# Patient Record
Sex: Female | Born: 1935 | Race: White | Hispanic: No | Smoking: Former smoker
Health system: Southern US, Community
[De-identification: ages and names within clinical notes are randomized; demographics above are authoritative.]

## PROBLEM LIST (undated history)

## (undated) DIAGNOSIS — C3490 Malignant neoplasm of unspecified part of unspecified bronchus or lung: Secondary | ICD-10-CM

## (undated) DIAGNOSIS — K5790 Diverticulosis of intestine, part unspecified, without perforation or abscess without bleeding: Secondary | ICD-10-CM

## (undated) DIAGNOSIS — T7840XA Allergy, unspecified, initial encounter: Secondary | ICD-10-CM

## (undated) DIAGNOSIS — I739 Peripheral vascular disease, unspecified: Secondary | ICD-10-CM

## (undated) DIAGNOSIS — C801 Malignant (primary) neoplasm, unspecified: Secondary | ICD-10-CM

## (undated) DIAGNOSIS — E7800 Pure hypercholesterolemia, unspecified: Secondary | ICD-10-CM

## (undated) DIAGNOSIS — Z923 Personal history of irradiation: Secondary | ICD-10-CM

## (undated) HISTORY — DX: Allergy, unspecified, initial encounter: T78.40XA

## (undated) HISTORY — DX: Personal history of irradiation: Z92.3

## (undated) HISTORY — DX: Pure hypercholesterolemia, unspecified: E78.00

## (undated) HISTORY — DX: Peripheral vascular disease, unspecified: I73.9

## (undated) HISTORY — PX: VOICE PROSTHESIS: SHX5222

## (undated) HISTORY — PX: TRACHEOSTOMY: SUR1362

---

## 1995-12-31 HISTORY — PX: LARYNGECTOMY: SUR815

## 2000-04-23 ENCOUNTER — Encounter: Payer: Self-pay | Admitting: Otolaryngology

## 2000-04-23 ENCOUNTER — Encounter: Admission: RE | Admit: 2000-04-23 | Discharge: 2000-04-23 | Payer: Self-pay | Admitting: Otolaryngology

## 2001-07-10 ENCOUNTER — Ambulatory Visit (HOSPITAL_BASED_OUTPATIENT_CLINIC_OR_DEPARTMENT_OTHER): Admission: RE | Admit: 2001-07-10 | Discharge: 2001-07-10 | Payer: Self-pay | Admitting: Otolaryngology

## 2002-07-01 ENCOUNTER — Encounter: Payer: Self-pay | Admitting: Otolaryngology

## 2002-07-01 ENCOUNTER — Encounter: Admission: RE | Admit: 2002-07-01 | Discharge: 2002-07-01 | Payer: Self-pay | Admitting: Otolaryngology

## 2003-05-22 ENCOUNTER — Emergency Department (HOSPITAL_COMMUNITY): Admission: EM | Admit: 2003-05-22 | Discharge: 2003-05-22 | Payer: Self-pay | Admitting: *Deleted

## 2003-08-14 ENCOUNTER — Emergency Department (HOSPITAL_COMMUNITY): Admission: EM | Admit: 2003-08-14 | Discharge: 2003-08-14 | Payer: Self-pay | Admitting: Emergency Medicine

## 2008-07-31 ENCOUNTER — Emergency Department (HOSPITAL_COMMUNITY): Admission: EM | Admit: 2008-07-31 | Discharge: 2008-07-31 | Payer: Self-pay | Admitting: Emergency Medicine

## 2009-04-29 ENCOUNTER — Emergency Department (HOSPITAL_COMMUNITY): Admission: EM | Admit: 2009-04-29 | Discharge: 2009-04-29 | Payer: Self-pay | Admitting: Emergency Medicine

## 2011-01-08 ENCOUNTER — Encounter
Admission: RE | Admit: 2011-01-08 | Discharge: 2011-01-08 | Payer: Self-pay | Source: Home / Self Care | Attending: Otolaryngology | Admitting: Otolaryngology

## 2011-01-22 ENCOUNTER — Ambulatory Visit
Admission: RE | Admit: 2011-01-22 | Discharge: 2011-01-22 | Payer: Self-pay | Source: Home / Self Care | Attending: Thoracic Surgery | Admitting: Thoracic Surgery

## 2011-01-23 NOTE — Letter (Signed)
January 22, 2011  Jefry H. Pollyann Kennedy, MD [address] [place] [address]  Re:  NEASIA, Kim Hardy                 DOB:  26-Oct-1931  Dear Trey Paula,  I saw the patient in the office today.  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  On followup, she was found to have a left upper lobe lesion.  Left lower lobe lesion that measured 2.6 x 1.7 in the superior segment.  She also has a 15-mm lesion in the left upper lobe that is ground-glass in appearance and a 11-mm subcarinal node.  She has had no hemoptysis, fever, chills or excessive sputum.  MEDICATIONS:  Aspirin  ALLERGIES:  She is allergic to anesthesia caused nausea and vomiting, Boniva caused tremors, sulfa caused hives and statins caused nausea and vomiting.  She has hypercholesterolemia.  FAMILY HISTORY:  Noncontributory.  SOCIAL HISTORY:  She is single, has one child, retired, quit smoking in 1997, occasional alcohol intake.  REVIEW OF SYSTEMS:  VITAL SIGNS:  She is 5 feet 9 inches. GENERAL:  Her weight has been stable. CARDIAC:  No angina or atrial fibrillation. PULMONARY:  No hemoptysis.  See history of present illness. GI:  No nausea, vomiting, constipation or diarrhea. GU:  No kidney disease, dysuria or frequent urination. VASCULAR:  No claudication, DVT or TIAs. NEUROLOGICAL:  No dizziness, headaches, blackouts, or seizures. MUSCULOSKELETAL:  Arthritis. PSYCHIATRIC:  No depression or nervous. EYES/ENT:  No change in eyesight or hearing. HEMATOLOGICAL:  No problems with bleeding, clotting disorders or anemia.  PHYSICAL EXAMINATION:  General:  She is a thin Caucasian female, in no acute distress.  Vital Signs:  Her blood pressure is 165/88, pulse 100, respirations 20, sats were 98%.  Head, eyes, ears, nose and throat: Unremarkable.  Neck:  There is laryngeal scar.  No adenopathy.  Lungs: Clear to auscultation and percussion.  Heart:  Regular sinus rhythm. Abdomen:  Soft.   There is no hepatosplenomegaly.  Extremities:  Pulses are 2+.  There is no clubbing or edema.  I feel that she probably has at least a stage IIIA non-small cell lung cancer in the right lower lobe.  PLAN:  To get a PET scan on her and brain scan and decide about next workup whether we should do resection or procedure right with radiation.  Ines Bloomer, M.D. Electronically Signed  DPB/MEDQ  D:  01/22/2011  T:  01/23/2011  Job:  [id number]

## 2011-01-29 ENCOUNTER — Ambulatory Visit (HOSPITAL_COMMUNITY)
Admission: RE | Admit: 2011-01-29 | Discharge: 2011-01-29 | Payer: Self-pay | Source: Home / Self Care | Attending: Thoracic Surgery | Admitting: Thoracic Surgery

## 2011-01-29 LAB — GLUCOSE, CAPILLARY: Glucose-Capillary: 112 mg/dL — ABNORMAL HIGH (ref 70–99)

## 2011-01-30 ENCOUNTER — Ambulatory Visit: Payer: Medicare Other | Admitting: Thoracic Surgery

## 2011-01-30 ENCOUNTER — Other Ambulatory Visit: Payer: Self-pay | Admitting: Thoracic Surgery

## 2011-01-30 DIAGNOSIS — R222 Localized swelling, mass and lump, trunk: Secondary | ICD-10-CM

## 2011-01-30 DIAGNOSIS — D381 Neoplasm of uncertain behavior of trachea, bronchus and lung: Secondary | ICD-10-CM

## 2011-01-31 ENCOUNTER — Ambulatory Visit
Admission: RE | Admit: 2011-01-31 | Discharge: 2011-01-31 | Disposition: A | Discharge status: Home / Self Care | Payer: Medicare Other | Source: Ambulatory Visit | Attending: Thoracic Surgery | Admitting: Thoracic Surgery

## 2011-01-31 DIAGNOSIS — R222 Localized swelling, mass and lump, trunk: Secondary | ICD-10-CM

## 2011-02-05 ENCOUNTER — Ambulatory Visit (HOSPITAL_COMMUNITY)
Admission: RE | Admit: 2011-02-05 | Discharge: 2011-02-05 | Disposition: A | Discharge status: Home / Self Care | Payer: Medicare Other | Source: Ambulatory Visit | Attending: Thoracic Surgery | Admitting: Thoracic Surgery

## 2011-02-05 ENCOUNTER — Other Ambulatory Visit: Payer: Self-pay | Admitting: Thoracic Surgery

## 2011-02-05 ENCOUNTER — Encounter (HOSPITAL_COMMUNITY)
Admission: RE | Admit: 2011-02-05 | Discharge: 2011-02-05 | Disposition: A | Discharge status: Home / Self Care | Payer: Medicare Other | Source: Ambulatory Visit

## 2011-02-05 DIAGNOSIS — R911 Solitary pulmonary nodule: Secondary | ICD-10-CM

## 2011-02-05 DIAGNOSIS — Z01818 Encounter for other preprocedural examination: Secondary | ICD-10-CM | POA: Insufficient documentation

## 2011-02-05 DIAGNOSIS — J984 Other disorders of lung: Secondary | ICD-10-CM | POA: Insufficient documentation

## 2011-02-05 LAB — COMPREHENSIVE METABOLIC PANEL
Albumin: 4.2 g/dL (ref 3.5–5.2)
BUN: 23 mg/dL (ref 6–23)
Calcium: 9.7 mg/dL (ref 8.4–10.5)
Creatinine, Ser: 0.87 mg/dL (ref 0.4–1.2)
Glucose, Bld: 104 mg/dL — ABNORMAL HIGH (ref 70–99)
Potassium: 4 mEq/L (ref 3.5–5.1)
Total Protein: 7.5 g/dL (ref 6.0–8.3)

## 2011-02-05 LAB — PROTIME-INR
INR: 1 (ref 0.00–1.49)
Prothrombin Time: 13.4 seconds (ref 11.6–15.2)

## 2011-02-05 LAB — CBC
MCH: 31.2 pg (ref 26.0–34.0)
MCHC: 33.2 g/dL (ref 30.0–36.0)
MCV: 93.7 fL (ref 78.0–100.0)
Platelets: 340 10*3/uL (ref 150–400)
RDW: 13 % (ref 11.5–15.5)
WBC: 7.7 10*3/uL (ref 4.0–10.5)

## 2011-02-05 LAB — SURGICAL PCR SCREEN
MRSA, PCR: NEGATIVE
Staphylococcus aureus: NEGATIVE

## 2011-02-05 LAB — APTT: aPTT: 26 seconds (ref 24–37)

## 2011-02-07 ENCOUNTER — Ambulatory Visit (HOSPITAL_COMMUNITY): Payer: Medicare Other

## 2011-02-07 ENCOUNTER — Other Ambulatory Visit: Payer: Self-pay | Admitting: Thoracic Surgery

## 2011-02-07 ENCOUNTER — Observation Stay (HOSPITAL_COMMUNITY): Payer: Medicare Other

## 2011-02-07 ENCOUNTER — Observation Stay (HOSPITAL_COMMUNITY)
Admission: RE | Admit: 2011-02-07 | Discharge: 2011-02-08 | Disposition: A | Discharge status: Home / Self Care | Payer: Medicare Other | Source: Ambulatory Visit | Attending: Thoracic Surgery | Admitting: Thoracic Surgery

## 2011-02-07 DIAGNOSIS — E7800 Pure hypercholesterolemia, unspecified: Secondary | ICD-10-CM | POA: Insufficient documentation

## 2011-02-07 DIAGNOSIS — Z01810 Encounter for preprocedural cardiovascular examination: Secondary | ICD-10-CM | POA: Insufficient documentation

## 2011-02-07 DIAGNOSIS — J95811 Postprocedural pneumothorax: Secondary | ICD-10-CM | POA: Insufficient documentation

## 2011-02-07 DIAGNOSIS — D381 Neoplasm of uncertain behavior of trachea, bronchus and lung: Secondary | ICD-10-CM

## 2011-02-07 DIAGNOSIS — Z9089 Acquired absence of other organs: Secondary | ICD-10-CM | POA: Insufficient documentation

## 2011-02-07 DIAGNOSIS — Z930 Tracheostomy status: Secondary | ICD-10-CM | POA: Insufficient documentation

## 2011-02-07 DIAGNOSIS — Z01812 Encounter for preprocedural laboratory examination: Secondary | ICD-10-CM | POA: Insufficient documentation

## 2011-02-07 DIAGNOSIS — Y849 Medical procedure, unspecified as the cause of abnormal reaction of the patient, or of later complication, without mention of misadventure at the time of the procedure: Secondary | ICD-10-CM | POA: Insufficient documentation

## 2011-02-07 DIAGNOSIS — Z8521 Personal history of malignant neoplasm of larynx: Secondary | ICD-10-CM | POA: Insufficient documentation

## 2011-02-07 DIAGNOSIS — R222 Localized swelling, mass and lump, trunk: Principal | ICD-10-CM | POA: Insufficient documentation

## 2011-02-07 DIAGNOSIS — Z01818 Encounter for other preprocedural examination: Secondary | ICD-10-CM | POA: Insufficient documentation

## 2011-02-08 ENCOUNTER — Observation Stay (HOSPITAL_COMMUNITY): Payer: Medicare Other

## 2011-02-09 LAB — CULTURE, RESPIRATORY W GRAM STAIN

## 2011-02-11 ENCOUNTER — Other Ambulatory Visit: Payer: Self-pay | Admitting: Thoracic Surgery

## 2011-02-11 DIAGNOSIS — C3430 Malignant neoplasm of lower lobe, unspecified bronchus or lung: Secondary | ICD-10-CM

## 2011-02-11 NOTE — Letter (Signed)
January 30, 2011  Jefry H. Pollyann Kennedy, MD [address] [place], [place] [address]  Re:  Kim Hardy, Kim Hardy                 DOB:  November 19, 1931  Dear Angela Burke,  I saw the patient back in the office today and reviewed her PET scan. Unfortunately, the right lower lobe lesion is positive.  The left upper lobe ground-glass lesion is negative.  She also has one subcarinal node that could possibly be positive.  The patient needs an electromagnetic navigation bronchoscopy with endobronchial ultrasound.  Finally, we scheduled this for the 9th.  I will see her back again on 9th.  She will have to have her CT scan reformatted for the electromagnetic navigation bronchoscopy.  I have discussed this with her.  She is very nervous.  I gave her a prescription for Ativan 0.5, take 3 times a day p.r.n.  Her blood pressure is 157/81, pulse 92, respirations 16, and sats were 95%.  Ines Bloomer, M.D. Electronically Signed  DPB/MEDQ  D:  01/30/2011  T:  01/31/2011  Job:  [id number]

## 2011-02-12 ENCOUNTER — Ambulatory Visit (INDEPENDENT_AMBULATORY_CARE_PROVIDER_SITE_OTHER): Payer: Medicare Other | Admitting: Thoracic Surgery

## 2011-02-12 ENCOUNTER — Ambulatory Visit
Admission: RE | Admit: 2011-02-12 | Discharge: 2011-02-12 | Disposition: A | Discharge status: Home / Self Care | Payer: Medicare Other | Source: Ambulatory Visit | Attending: Thoracic Surgery | Admitting: Thoracic Surgery

## 2011-02-12 DIAGNOSIS — C3430 Malignant neoplasm of lower lobe, unspecified bronchus or lung: Secondary | ICD-10-CM

## 2011-02-12 NOTE — Letter (Signed)
February 12, 2011  Jefry H. Pollyann Kennedy, MD [address] [place] [address]  Re:  Kim Hardy, Kim Hardy                 DOB:  [birth year]  Dear Angela Burke,  I saw the patient back in the office today.  She underwent electromagnetic bronchoscopy with endobronchial ultrasound.  On her ultrasound, all of her nodes were negative and unfortunately on the electromagnetic bronchoscopy, we did not obtain a good diagnosis of cancer, so we are going to have to have a needle biopsy done of this. She has coughed up some purulent material from laryngostome and this is probably secondary to some trauma to her laryngostome, but it looks like it is healing finally.  At the time of surgery, she did have a small pneumothorax, but we did not elect to place a chest tube and a chest x- ray today shows that this has resolved.  I will let you know after her needle biopsy.  Ines Bloomer, M.D. Electronically Signed  DPB/MEDQ  D:  02/12/2011  T:  02/12/2011  Job:  [id number]

## 2011-02-14 ENCOUNTER — Other Ambulatory Visit: Payer: Self-pay | Admitting: Thoracic Surgery

## 2011-02-14 DIAGNOSIS — R918 Other nonspecific abnormal finding of lung field: Secondary | ICD-10-CM

## 2011-02-14 NOTE — Op Note (Signed)
  Kim Hardy, Kim Hardy                 ACCOUNT NO.:  [id number]  MEDICAL RECORD NO.:  [id number]           PATIENT TYPE:  I  LOCATION:  2020                         FACILITY:  [facility]  PHYSICIAN:  Ines Bloomer, M.D. DATE OF BIRTH:  1931/09/28  DATE OF PROCEDURE: DATE OF DISCHARGE:                              OPERATIVE REPORT   PREOPERATIVE DIAGNOSIS:  Non-small cell lung cancer, status post laryngectomy, right lower lobe mass.  POSTOPERATIVE DIAGNOSIS:  Non-small cell lung cancer, status post laryngectomy, right lower lobe mass.  OPERATION PERFORMED:  Fiberoptic bronchoscopy with endobronchial ultrasound and EBUS.  After general anesthesia through the laryngoscope was passed an endotracheal tube and through the endotracheal tube was passed the 2.8 Pentax scope.  The carina was in the midline.  The left mainstem, left upper lobe, and left lower lobe orifices were normal.  The right mainstem, right upper lobe, and right lower lobe orifices were normal. Scope was removed and the endobronchial ultrasound was inserted, and then coming from the left side we identified a 7 node, and through that we passed the sheath out to the wall and then out of the sheath under ultrasound guidance passed it into the 7 lymph nodes.  We did three passes.  The initial report on this was negative.  After we had done the passes, we pushed the needle back in the scope and then removed the needle and sheath at one time.  After this had been done three times, we then went to electromagnetic avocation and we put the scope in with the locatable guide and did automatic registration.  After automatic registration, we then navigated to the right lower lobe and passed the extended working channel out to near the lesion.  We got within 0.8-1.5 of the lesion.  There was some variation with respirations.  We then passed the bronchial brushes out and then four different applications of bronchial brushes, the initial  ones were negative but we did several more after that and then we removed the bronchial brushing and passed the biopsy forceps out and under fluoro guidance through the extended working channel we did multiple biopsies.  After this was done, we then did a BAL for cytology and then removed the working channel.  The patient was returned to the recovery room in stable condition.     Ines Bloomer, M.D.     DPB/MEDQ  D:  02/07/2011  T:  02/08/2011  Job:  [id number]  Electronically Signed by Jovita Gamma M.D. on 02/14/2011 03:39:27 PM

## 2011-02-19 ENCOUNTER — Ambulatory Visit (HOSPITAL_COMMUNITY)
Admission: RE | Admit: 2011-02-19 | Discharge: 2011-02-19 | Disposition: A | Discharge status: Home / Self Care | Payer: Medicare Other | Source: Ambulatory Visit | Attending: Thoracic Surgery | Admitting: Thoracic Surgery

## 2011-02-19 ENCOUNTER — Encounter (HOSPITAL_COMMUNITY): Payer: Self-pay

## 2011-02-19 ENCOUNTER — Other Ambulatory Visit: Payer: Self-pay | Admitting: Interventional Radiology

## 2011-02-19 ENCOUNTER — Other Ambulatory Visit (HOSPITAL_COMMUNITY): Payer: Self-pay | Admitting: Interventional Radiology

## 2011-02-19 ENCOUNTER — Ambulatory Visit (HOSPITAL_COMMUNITY)
Admission: RE | Admit: 2011-02-19 | Discharge: 2011-02-19 | Disposition: A | Discharge status: Home / Self Care | Payer: Medicare Other | Source: Ambulatory Visit | Attending: Interventional Radiology | Admitting: Interventional Radiology

## 2011-02-19 DIAGNOSIS — Z01812 Encounter for preprocedural laboratory examination: Secondary | ICD-10-CM | POA: Insufficient documentation

## 2011-02-19 DIAGNOSIS — R918 Other nonspecific abnormal finding of lung field: Secondary | ICD-10-CM

## 2011-02-19 DIAGNOSIS — C3430 Malignant neoplasm of lower lobe, unspecified bronchus or lung: Secondary | ICD-10-CM | POA: Insufficient documentation

## 2011-02-19 HISTORY — DX: Malignant (primary) neoplasm, unspecified: C80.1

## 2011-02-19 LAB — CBC
HCT: 38.3 % (ref 36.0–46.0)
Hemoglobin: 12.7 g/dL (ref 12.0–15.0)
MCHC: 33.2 g/dL (ref 30.0–36.0)
MCV: 93.4 fL (ref 78.0–100.0)
RDW: 12.8 % (ref 11.5–15.5)

## 2011-02-19 LAB — PROTIME-INR: INR: 1 (ref 0.00–1.49)

## 2011-02-19 LAB — APTT: aPTT: 28 seconds (ref 24–37)

## 2011-02-27 ENCOUNTER — Encounter (INDEPENDENT_AMBULATORY_CARE_PROVIDER_SITE_OTHER): Payer: Medicare Other | Admitting: Thoracic Surgery

## 2011-02-27 DIAGNOSIS — C3430 Malignant neoplasm of lower lobe, unspecified bronchus or lung: Secondary | ICD-10-CM

## 2011-02-27 NOTE — Letter (Signed)
February 27, 2011  Jefry H. Pollyann Kennedy, MD [address] [place] [address]  Re:  NGOZI, ALVIDREZ                 DOB:  1931-11-09  Dear Trey Paula,  I saw Ms. Baril back in the office today and we finally got a diagnosis of this right lower lobe lesion.  It is an adenocarcinoma which is obviously different from her head and neck cancer.  This is isolated in the lung and I think there is a good chance we can treat this with stereotactic body radiation therapy.  I will present her at cancer conference tomorrow; and if that is the case, then we will proceed with that.  She may need to have fiducials placed and we will take care of that also.  Her blood pressure is 132/64, pulse 87, respirations 18, and sats were 98%.  Ines Bloomer, M.D. Electronically Signed  DPB/MEDQ  D:  02/27/2011  T:  02/27/2011  Job:  [id number]

## 2011-03-08 LAB — FUNGUS CULTURE W SMEAR: Fungal Smear: NONE SEEN

## 2011-03-20 ENCOUNTER — Ambulatory Visit: Payer: Medicare Other | Attending: Radiation Oncology | Admitting: Radiation Oncology

## 2011-03-20 DIAGNOSIS — C3430 Malignant neoplasm of lower lobe, unspecified bronchus or lung: Secondary | ICD-10-CM | POA: Insufficient documentation

## 2011-03-22 LAB — AFB CULTURE WITH SMEAR (NOT AT ARMC)

## 2011-04-09 LAB — URINE CULTURE

## 2011-04-09 LAB — URINALYSIS, ROUTINE W REFLEX MICROSCOPIC
Ketones, ur: NEGATIVE mg/dL
Protein, ur: 30 mg/dL — AB
Urobilinogen, UA: 0.2 mg/dL (ref 0.0–1.0)

## 2011-04-09 LAB — URINE MICROSCOPIC-ADD ON

## 2011-05-10 ENCOUNTER — Other Ambulatory Visit: Payer: Self-pay | Admitting: Radiation Oncology

## 2011-05-10 ENCOUNTER — Ambulatory Visit: Payer: Medicare Other | Attending: Radiation Oncology | Admitting: Radiation Oncology

## 2011-05-10 DIAGNOSIS — C3490 Malignant neoplasm of unspecified part of unspecified bronchus or lung: Secondary | ICD-10-CM

## 2011-05-17 NOTE — Op Note (Signed)
[facility]  Patient:    Kim Hardy, Kim Hardy                        MRN: [id number] Proc. Date: 07/10/01 Adm. Date:  [id number] Attending:  Serena Colonel H                           Operative Report  PREOPERATIVE DIAGNOSIS:  Aphonia, status post total laryngectomy.  POSTOPERATIVE DIAGNOSIS:  Aphonia, status post total laryngectomy.  PROCEDURE:  Revision tracheoesophageal puncture.  SURGEON:  Jefry H. Pollyann Kennedy, M.D.  ANESTHESIA:  General endotracheal anesthesia was used.  COMPLICATIONS:  No complications.  DISPOSITION:  The patient tolerated the procedure well and was awakened, extubated, and transported to recovery in stable condition.  HISTORY:  This is a 77 year old lady who underwent total laryngectomy about 4-1/2 years ago and who was successfully rehabilitated with tracheoesophageal puncture and Blom-Singer voice prosthesis.  About two weeks ago her prosthesis slipped out partially, and the puncture site had healed closed internally.  i was not able to open this in the office.  Risks, benefits, alternatives, and complications of the procedure were explained to the patient, who seemed to understand and agreed to surgery.  DESCRIPTION OF PROCEDURE:  The patient was taken to the operating room and placed on the operating table in supine position.  Following induction of general endotracheal anesthesia via the endotracheal stoma, the neck was prepped and draped in a standard fashion.  A rigid esophagoscope was entered into the oral cavity, used to view the esophageal inlet.  There was some stricture of the esophageal inlet that was dilated up with a series of Maloney dilators up to 48 [place].  I was then able to insert the esophagoscope into the esophagus, turn it upside down to the bevel was facing upward toward the stoma.  A 0 silk on a needle was then inserted through the upper aspect of the stoma into the esophagoscope.  This was then retrieved  using foreign body forceps.  The opening was enlarged with a 15 scalpel, spread open with hemostat, and a #20 French red rubber catheter was then inserted into the puncture site.  It was brought out through the nasal cavity and cuff off at the nasal vestibule.  It was secured in place to the chest and to the columella of the right side of the nose with a nylon suture.  The patient was then awakened, extubated, and transferred to recovery in stable condition. DD:  07/10/01 TD:  07/10/01 Job: [id number]

## 2011-05-17 NOTE — Consult Note (Signed)
NAME:  Kim Hardy, Kim Hardy                           ACCOUNT NO.:  [id number]   MEDICAL RECORD NO.:  [id number]                   PATIENT TYPE:  EMS   LOCATION:  MAJO                                 FACILITY:  [facility]   PHYSICIAN:  Karol T. Lazarus Salines, M.D.              DATE OF BIRTH:  Mar 18, 1931   DATE OF CONSULTATION:  08/14/2003  DATE OF DISCHARGE:                                   CONSULTATION   CHIEF COMPLAINT:  Dislodge Blom-Singer prosthesis.   HISTORY:  A 77 year old white female, 7-8 years status post total  laryngectomy for cancer has been wearing a Blom-Singer prosthesis with good  success.  Dr. Pollyann Kennedy changes this every 2-3 months without difficulty. This  morning, she awakened and when doing her routine hygiene, the prosthesis was  stuck to the tape but apparently dislodged from the fistula tract. She was  unable to reinsert it herself.  No bleeding or pain, she was breathing fine.   PHYSICAL EXAMINATION:  This is a petite, older middle-aged white female in  no acute distress.  She is able to finger occlude her stoma and talk in a  raspy but functional fashion. There is a small amount of salivary drainage  through the fistulous tract.   IMPRESSION:  Dislodged tracheoesophageal fistula prosthesis.   PLAN:  With the patient's assistance, a new prosthesis was inserted in the  gel cap in the standard fashion.  I could not directly insert this into the  fistula tract, which appeared to have closed off somewhat.  I was able to  pass a 12 French catheter through the fistula tract without difficulty.  Following this, I passed a 39 [place] coude catheter into the tract and  dilated the tract slightly, and allowed it to remain in place for several  minutes. Finally, the proprietary Blom-Singer 22 French tract dilator was  placed and allowed to remain in place for several minutes. Following this, I  was able to replace the prosthesis without difficulty.  It was held in  position several  minutes while the gel cap dissolved, to prevent accidental  dislodgement.  The patient tolerated this nicely. No significant bleeding,  no further aspiration noted once the valve was in place.  The patient was  able to phonate at her baseline level following the replacement.   She will continue with routine tube hygiene and check back with Dr. Pollyann Kennedy as  routinely scheduled.                                               Gloris Manchester. Lazarus Salines, M.D.    KTW/MEDQ  D:  08/14/2003  T:  08/14/2003  Job:  [id number]   cc:   Enrigue Catena H. Pollyann Kennedy, M.D.  [address]  [place]  [place]  [address]  Fax: [phone]

## 2011-06-14 ENCOUNTER — Ambulatory Visit
Admission: RE | Admit: 2011-06-14 | Discharge: 2011-06-14 | Disposition: A | Discharge status: Home / Self Care | Payer: Medicare Other | Source: Ambulatory Visit | Attending: Radiation Oncology | Admitting: Radiation Oncology

## 2011-06-14 DIAGNOSIS — C3490 Malignant neoplasm of unspecified part of unspecified bronchus or lung: Secondary | ICD-10-CM | POA: Insufficient documentation

## 2011-06-18 ENCOUNTER — Ambulatory Visit (HOSPITAL_COMMUNITY)
Admission: RE | Admit: 2011-06-18 | Discharge: 2011-06-18 | Disposition: A | Discharge status: Home / Self Care | Payer: Medicare Other | Source: Ambulatory Visit | Attending: Radiation Oncology | Admitting: Radiation Oncology

## 2011-06-18 ENCOUNTER — Encounter (HOSPITAL_COMMUNITY): Payer: Self-pay

## 2011-06-18 DIAGNOSIS — I2510 Atherosclerotic heart disease of native coronary artery without angina pectoris: Secondary | ICD-10-CM | POA: Insufficient documentation

## 2011-06-18 DIAGNOSIS — I700 Atherosclerosis of aorta: Secondary | ICD-10-CM | POA: Insufficient documentation

## 2011-06-18 DIAGNOSIS — C3490 Malignant neoplasm of unspecified part of unspecified bronchus or lung: Secondary | ICD-10-CM | POA: Insufficient documentation

## 2011-06-18 DIAGNOSIS — C329 Malignant neoplasm of larynx, unspecified: Secondary | ICD-10-CM | POA: Insufficient documentation

## 2011-06-18 DIAGNOSIS — K449 Diaphragmatic hernia without obstruction or gangrene: Secondary | ICD-10-CM | POA: Insufficient documentation

## 2011-06-18 DIAGNOSIS — Z930 Tracheostomy status: Secondary | ICD-10-CM | POA: Insufficient documentation

## 2011-06-18 HISTORY — DX: Malignant neoplasm of unspecified part of unspecified bronchus or lung: C34.90

## 2011-06-18 MED ORDER — IOHEXOL 300 MG/ML  SOLN
80.0000 mL | Freq: Once | INTRAMUSCULAR | Status: AC | PRN
  Administered 2011-06-18: 80 mL via INTRAVENOUS

## 2011-06-21 ENCOUNTER — Other Ambulatory Visit: Payer: Self-pay | Admitting: Radiation Oncology

## 2011-06-21 ENCOUNTER — Ambulatory Visit
Admission: RE | Admit: 2011-06-21 | Discharge: 2011-06-21 | Disposition: A | Discharge status: Home / Self Care | Payer: Medicare Other | Source: Ambulatory Visit | Attending: Radiation Oncology | Admitting: Radiation Oncology

## 2011-06-21 DIAGNOSIS — C3490 Malignant neoplasm of unspecified part of unspecified bronchus or lung: Secondary | ICD-10-CM

## 2011-09-20 ENCOUNTER — Ambulatory Visit
Admission: RE | Admit: 2011-09-20 | Discharge: 2011-09-20 | Disposition: A | Discharge status: Home / Self Care | Payer: Medicare Other | Source: Ambulatory Visit | Attending: Radiation Oncology | Admitting: Radiation Oncology

## 2011-09-20 DIAGNOSIS — C3490 Malignant neoplasm of unspecified part of unspecified bronchus or lung: Secondary | ICD-10-CM | POA: Insufficient documentation

## 2011-09-20 LAB — BUN: BUN: 21 mg/dL (ref 6–23)

## 2011-09-24 ENCOUNTER — Ambulatory Visit (HOSPITAL_COMMUNITY)
Admission: RE | Admit: 2011-09-24 | Discharge: 2011-09-24 | Disposition: A | Discharge status: Home / Self Care | Payer: Medicare Other | Source: Ambulatory Visit | Attending: Radiation Oncology | Admitting: Radiation Oncology

## 2011-09-24 DIAGNOSIS — Z8521 Personal history of malignant neoplasm of larynx: Secondary | ICD-10-CM | POA: Insufficient documentation

## 2011-09-24 DIAGNOSIS — C3430 Malignant neoplasm of lower lobe, unspecified bronchus or lung: Secondary | ICD-10-CM | POA: Insufficient documentation

## 2011-09-24 DIAGNOSIS — C3490 Malignant neoplasm of unspecified part of unspecified bronchus or lung: Secondary | ICD-10-CM

## 2011-09-24 MED ORDER — IOHEXOL 300 MG/ML  SOLN
80.0000 mL | Freq: Once | INTRAMUSCULAR | Status: AC | PRN
  Administered 2011-09-24: 80 mL via INTRAVENOUS

## 2011-09-27 ENCOUNTER — Ambulatory Visit
Admission: RE | Admit: 2011-09-27 | Discharge: 2011-09-27 | Disposition: A | Discharge status: Home / Self Care | Payer: Medicare Other | Source: Ambulatory Visit | Attending: Radiation Oncology | Admitting: Radiation Oncology

## 2012-01-15 ENCOUNTER — Other Ambulatory Visit: Payer: Self-pay | Admitting: Radiation Oncology

## 2012-01-15 DIAGNOSIS — C3490 Malignant neoplasm of unspecified part of unspecified bronchus or lung: Secondary | ICD-10-CM

## 2012-01-16 ENCOUNTER — Telehealth: Payer: Self-pay | Admitting: *Deleted

## 2012-01-28 ENCOUNTER — Encounter (HOSPITAL_COMMUNITY): Payer: Self-pay

## 2012-01-28 ENCOUNTER — Ambulatory Visit (HOSPITAL_COMMUNITY)
Admission: RE | Admit: 2012-01-28 | Discharge: 2012-01-28 | Disposition: A | Discharge status: Home / Self Care | Payer: Medicare Other | Source: Ambulatory Visit | Attending: Radiation Oncology | Admitting: Radiation Oncology

## 2012-01-28 DIAGNOSIS — C3490 Malignant neoplasm of unspecified part of unspecified bronchus or lung: Secondary | ICD-10-CM | POA: Insufficient documentation

## 2012-01-28 DIAGNOSIS — Z8521 Personal history of malignant neoplasm of larynx: Secondary | ICD-10-CM | POA: Insufficient documentation

## 2012-01-28 MED ORDER — FLUDEOXYGLUCOSE F - 18 (FDG) INJECTION
19.2000 | Freq: Once | INTRAVENOUS | Status: AC | PRN
  Administered 2012-01-28: 19.2 via INTRAVENOUS

## 2012-01-30 ENCOUNTER — Encounter: Payer: Self-pay | Admitting: Radiation Oncology

## 2012-01-30 DIAGNOSIS — C801 Malignant (primary) neoplasm, unspecified: Secondary | ICD-10-CM | POA: Insufficient documentation

## 2012-01-30 DIAGNOSIS — E7800 Pure hypercholesterolemia, unspecified: Secondary | ICD-10-CM | POA: Insufficient documentation

## 2012-01-30 DIAGNOSIS — C3490 Malignant neoplasm of unspecified part of unspecified bronchus or lung: Secondary | ICD-10-CM | POA: Insufficient documentation

## 2012-01-31 ENCOUNTER — Ambulatory Visit
Admission: RE | Admit: 2012-01-31 | Discharge: 2012-01-31 | Disposition: A | Discharge status: Home / Self Care | Payer: Medicare Other | Source: Ambulatory Visit | Attending: Radiation Oncology | Admitting: Radiation Oncology

## 2012-01-31 ENCOUNTER — Encounter: Payer: Self-pay | Admitting: Radiation Oncology

## 2012-01-31 VITALS — BP 124/74 | HR 111 | Temp 98.3°F | Resp 20 | Ht 60.0 in | Wt 113.1 lb

## 2012-01-31 DIAGNOSIS — C3430 Malignant neoplasm of lower lobe, unspecified bronchus or lung: Secondary | ICD-10-CM | POA: Diagnosis present

## 2012-01-31 NOTE — Progress Notes (Signed)
Pt states she has "more sputum production in mornings now". She states "it is green, but she had this before being dx w/lung cancer". She notices this when she suctions her trach site. She denies any other symptoms or problems; states "it doesn't get worse, stays the same."  Pt has appt w/PCP, Dr Collins [place] next week.  Pt was released by Dr Edwyna Shell. Followed by Dr Pollyann Kennedy q 3 mos.

## 2012-02-04 NOTE — Progress Notes (Signed)
CC:   Ines Bloomer, M.D.  DIAGNOSIS:  Stage I non-small cell lung cancer.  INTERVAL HISTORY:  Ms. Posa returns to clinic today for followup.  She has been followed with scans and she did have a recent PET scan for restaging on 01/28/2012.  Overall, she states that she is doing well clinically with no significant changes in her breathing or chest discomfort.  She does note some continued sputum from her trach site, but no change in this.  Her PET scan showed some hypermetabolic activity in the region of her prior treatment with regards to the right lower lobe pulmonary nodule.  This appeared consistent with radiation pneumonitis.  It was noted that there was a nodular focus of hypermetabolic activity in the region of the pulmonary nodule which to some degree obscured interpretation of this area with some pulmonary airspace capacity in this region on CT which corresponded to the diffuse hypermetabolic activity at this site.  There was a stable 15 mm left upper lobe pulmonary nodule which has previously been seen.  This began appeared stable with respect to size, although the maximum SUV today was a 3.2 as compared to 1.9 on the previous PET scan from early 2012.  This was felt to be suspicious for a possible low grade adenocarcinoma.  PHYSICAL EXAM:  Weight 113 pounds, blood pressure 124/74, pulse 111, temperature 98.3, O2 saturation 98%, respiratory rate 20. Cardiovascular:  Regular rate and rhythm.  Respiratory:  Clear to auscultation bilaterally.  GI:  Abdomen is soft, nontender, normal bowel sounds.  Extremities:  No edema present.  IMPRESSION AND PLAN:  Ms. Erdman clinically is doing well with no significant respiratory changes.  Her PET scan did show some apparent radiation pneumonitis in the right lower lobe where she was previously treated, and some degree of increased hypermetabolic activity focally within this although this was a mixed picture predominantly with radiation  change.  A left lung nodule was stable at 1.5 cm, but did have an increase in PET activity.  Overall, this separate nodule was felt to remain suspicious for a low-grade adenocarcinoma.  I discussed with Ms. Linnen that I would like to put her on for discussion at Lung Conference hopefully next week.  I am not sure that anything needs to be done at this time, although the area in the left lung could be worked up potentially with biopsy and this is a good area for a 2nd course of radiosurgery.  This is a good area and the area is quite small, so I believe that this would be well tolerated with minimal side effects as well.  Other treatment or moving towards treatment with a biopsy could be considered, although given the stability of this area, a followup scan in several months would also be reasonable I believe.  We will, therefore, discuss this further next week and I indicated that I would contact Ms. Dutch Quint after this conference to discuss the consensus.  I spent 15 minutes with Ms. Corp today, the majority of which was spent counseling her on her diagnosis and coordinating her care.    ______________________________ Radene Gunning, M.D., Ph.D. JSM/MEDQ  D:  01/31/2012  T:  02/04/2012  Job:  [id number]

## 2012-02-07 ENCOUNTER — Other Ambulatory Visit: Payer: Self-pay | Admitting: Radiation Oncology

## 2012-02-07 DIAGNOSIS — C3430 Malignant neoplasm of lower lobe, unspecified bronchus or lung: Secondary | ICD-10-CM

## 2012-02-10 ENCOUNTER — Telehealth: Payer: Self-pay | Admitting: *Deleted

## 2012-02-21 NOTE — Telephone Encounter (Signed)
xxx

## 2012-05-04 ENCOUNTER — Encounter: Payer: Self-pay | Admitting: *Deleted

## 2012-05-05 ENCOUNTER — Ambulatory Visit
Admission: RE | Admit: 2012-05-05 | Discharge: 2012-05-05 | Disposition: A | Discharge status: Home / Self Care | Payer: Medicare Other | Source: Ambulatory Visit | Attending: Radiation Oncology | Admitting: Radiation Oncology

## 2012-05-05 DIAGNOSIS — C3430 Malignant neoplasm of lower lobe, unspecified bronchus or lung: Secondary | ICD-10-CM

## 2012-05-05 LAB — CREATININE, SERUM: Creatinine, Ser: 0.83 mg/dL (ref 0.50–1.10)

## 2012-05-05 LAB — BUN: BUN: 16 mg/dL (ref 6–23)

## 2012-05-06 ENCOUNTER — Ambulatory Visit (HOSPITAL_COMMUNITY)
Admission: RE | Admit: 2012-05-06 | Discharge: 2012-05-06 | Disposition: A | Discharge status: Home / Self Care | Payer: Medicare Other | Source: Ambulatory Visit | Attending: Radiation Oncology | Admitting: Radiation Oncology

## 2012-05-06 DIAGNOSIS — C3490 Malignant neoplasm of unspecified part of unspecified bronchus or lung: Secondary | ICD-10-CM | POA: Insufficient documentation

## 2012-05-06 DIAGNOSIS — C3430 Malignant neoplasm of lower lobe, unspecified bronchus or lung: Secondary | ICD-10-CM

## 2012-05-06 MED ORDER — IOHEXOL 300 MG/ML  SOLN
80.0000 mL | Freq: Once | INTRAMUSCULAR | Status: AC | PRN
  Administered 2012-05-06: 80 mL via INTRAVENOUS

## 2012-05-08 ENCOUNTER — Ambulatory Visit
Admission: RE | Admit: 2012-05-08 | Discharge: 2012-05-08 | Disposition: A | Discharge status: Home / Self Care | Payer: Medicare Other | Source: Ambulatory Visit | Attending: Radiation Oncology | Admitting: Radiation Oncology

## 2012-05-08 ENCOUNTER — Encounter: Payer: Self-pay | Admitting: Radiation Oncology

## 2012-05-08 VITALS — BP 127/67 | HR 84 | Temp 97.7°F | Resp 20

## 2012-05-08 DIAGNOSIS — C3430 Malignant neoplasm of lower lobe, unspecified bronchus or lung: Secondary | ICD-10-CM

## 2012-05-08 NOTE — Progress Notes (Signed)
folow up lung ca radiation txs=04/01/2011-04/10/2011,  Alert,oriented x3, no c/o pain or discomfort, CT Chest 05/06/12 results in, no changes un meds 10:49 AM

## 2012-05-08 NOTE — Progress Notes (Signed)
Radiation Oncology         [phone] ________________________________  Name: Kim Hardy MRN: [id number]  Date: 05/08/2012  DOB: 27-Oct-1931  Follow-Up Visit Note  CC: Herb Grays, MD, MD  Ines Bloomer, MD  Diagnosis:   Stage I non-small cell lung cancer  Interval Since Last Radiation:  approximately one year   Narrative:  The patient returns today for routine follow-up.  The patient states that she has been doing well. She notes some congestion and she has had a new prosthesis but it is far as her trach. She notes no worsening shortness of breath. She notes no pain in the chest area, no esophagitis. Also no distant pain which is new or severe. The patient did have a repeat CT scan of the chest completed on 05/06/2012. There were some persistent changes within the right lower lobe with no evidence of new or progressive disease. The area which had been previously seen in the left upper lobe was not specifically commented on but this looks similar. I measured both the prior area and the current area at this location at approximately 1.5 cm. It had been decided or recommended in lung conference previously to repeat a scan instead of working this up further although this has been a second area which we have continue to follow.                              ALLERGIES:  is allergic to anesthetics, amide; boniva; statins; and sulfa antibiotics.  Meds: Current Outpatient Prescriptions  Medication Sig Dispense Refill  . aspirin 81 MG tablet Take 81 mg by mouth daily. Takes at night      . fenofibrate 160 MG tablet Take 160 mg by mouth daily.        Physical Findings: The patient is in no acute distress. Patient is alert and oriented.  oral temperature is 97.7 F (36.5 C). Her blood pressure is 127/67 and her pulse is 84. Her respiration is 20 and oxygen saturation is 98%. .   General: Well-developed, in no acute distress HEENT: Normocephalic, atraumatic Cardiovascular: Regular rate and  rhythm Respiratory: Clear to auscultation bilaterally GI: Soft, nontender, normal bowel sounds Extremities: No edema present  Lab Findings: Lab Results  Component Value Date   WBC 8.6 02/19/2011   HGB 12.7 02/19/2011   HCT 38.3 02/19/2011   MCV 93.4 02/19/2011   PLT 369 02/19/2011     Radiographic Findings: Ct Chest W Contrast  05/06/2012  *RADIOLOGY REPORT*  Clinical Data: Lung cancer status post radiation to right lung  CT CHEST WITH CONTRAST  Technique:  Multidetector CT imaging of the chest was performed following the standard protocol during bolus administration of intravenous contrast.  Contrast: 80mL OMNIPAQUE IOHEXOL 300 MG/ML  SOLN  Comparison: PET CT scan 01/28/2012  Findings: No axillary or supraclavicular lymphadenopathy.  No mediastinal lymphadenopathy.  No pericardial fluid.  Esophagus is normal.  There is atelectasis and air bronchograms in the right lower lobe which appears slightly improved compared to PET CT scan 01/28/2012. This is treatment site of prior nodule.  There is no new or suspicious pulmonary nodules.  Airways centrally are normal.  Limited view of the upper abdomen demonstrates normal adrenal glands.  There is no focal hepatic lesion present.  Limited view of the skeleton is unremarkable.  IMPRESSION:  1.  Persistent but mildly improved atelectasis, air bronchograms and peribronchial thickening at treatment site in  the right lower lobe. 2.  No evidence of new or progressive disease.  Original Report Authenticated By: Genevive Bi, M.D.    Impression:    78 year old female status post radiosurgery for a stage I non-small cell lung cancer of the right lower lobe. No evidence of progressive disease at this time.  Plan:  I will repeat a CT scan of the chest in 4 months with followup after this.  I spent 15 minutes with the patient today, the majority of which was spent counseling the patient on the diagnosis of cancer and coordinating care.   Radene Gunning, M.D.,  Ph.D.

## 2012-05-11 ENCOUNTER — Telehealth: Payer: Self-pay | Admitting: *Deleted

## 2012-05-11 NOTE — Telephone Encounter (Signed)
Returned patient's phone call, spoke with patient 

## 2012-05-11 NOTE — Telephone Encounter (Signed)
Called patient to inform of test, spoke with patient and she is aware of this test. 

## 2012-08-08 ENCOUNTER — Encounter (HOSPITAL_COMMUNITY): Payer: Self-pay | Admitting: *Deleted

## 2012-08-08 ENCOUNTER — Emergency Department (HOSPITAL_COMMUNITY)
Admission: EM | Admit: 2012-08-08 | Discharge: 2012-08-08 | Disposition: A | Discharge status: Home / Self Care | Payer: Medicare Other | Attending: Emergency Medicine | Admitting: Emergency Medicine

## 2012-08-08 DIAGNOSIS — Z87891 Personal history of nicotine dependence: Secondary | ICD-10-CM | POA: Insufficient documentation

## 2012-08-08 DIAGNOSIS — Z469 Encounter for fitting and adjustment of unspecified device: Secondary | ICD-10-CM

## 2012-08-08 DIAGNOSIS — E7800 Pure hypercholesterolemia, unspecified: Secondary | ICD-10-CM | POA: Insufficient documentation

## 2012-08-08 DIAGNOSIS — Z9089 Acquired absence of other organs: Secondary | ICD-10-CM | POA: Insufficient documentation

## 2012-08-08 DIAGNOSIS — Z438 Encounter for attention to other artificial openings: Secondary | ICD-10-CM | POA: Insufficient documentation

## 2012-08-08 DIAGNOSIS — Z882 Allergy status to sulfonamides status: Secondary | ICD-10-CM | POA: Insufficient documentation

## 2012-08-08 DIAGNOSIS — Z8521 Personal history of malignant neoplasm of larynx: Secondary | ICD-10-CM | POA: Insufficient documentation

## 2012-08-08 DIAGNOSIS — Z85118 Personal history of other malignant neoplasm of bronchus and lung: Secondary | ICD-10-CM | POA: Insufficient documentation

## 2012-08-08 NOTE — ED Provider Notes (Signed)
History     CSN: [id number]  Arrival date & time 08/08/12  0820   First MD Initiated Contact with Patient 08/08/12 [phone]      Chief Complaint  Patient presents with  . Wound Check    (Consider location/radiation/quality/duration/timing/severity/associated sxs/prior treatment) HPI Comments: Pt with hx of lung cancer, s/p laryngectomy with a voice modifying device in a neck stoma comes in with cc of device falling out. Around &;30 patient was washing the device, and it fell off. She came to the ED straight from home. Patient had the device placed by ENT specialist just 2 weeks ago. She denies any respiratory complains, and is able to control her secretions well at this time.   Patient is a 78 y.o. female presenting with wound check. The history is provided by the patient and medical records.  Wound Check     Past Medical History  Diagnosis Date  . Hypercholesterolemia   . History of radiation therapy 04/01/2011- 4/11/212    right lung  lower lobe  . 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 Lung cancer dx'd 01-2011    xrt comp 03/2011  . Allergy     sulfa-hives, boniva=tremors,statins=n/v    Past Surgical History  Procedure Date  . Laryngectomy 1997  . Voice prosthesis     No family history on file.  History  Substance Use Topics  . Smoking status: Former Smoker -- 1.0 packs/day for 20 years    Quit date: 03/30/1996  . Smokeless tobacco: Not on file   Comment: quit w/dx laryngeal cancer 1997  . Alcohol Use: Not on file    OB History    Grav Para Term Preterm Abortions TAB SAB Ect Mult Living                  Review of Systems  Constitutional: Negative for activity change.  HENT: Negative for neck pain.   Respiratory: Negative for cough, choking, shortness of breath and stridor.   Cardiovascular: Negative for chest pain.  Gastrointestinal: Negative for nausea, vomiting and abdominal pain.  Genitourinary: Negative for dysuria.  Neurological: Negative for  headaches.    Allergies  Anesthetics, amide; Boniva; Statins; and Sulfa antibiotics  Home Medications   Current Outpatient Rx  Name Route Sig Dispense Refill  . ASPIRIN EC 81 MG PO TBEC Oral Take 81 mg by mouth at bedtime.    . FENOFIBRATE 160 MG PO TABS Oral Take 160 mg by mouth daily.    Marland Kitchen OVER THE COUNTER MEDICATION Both Eyes Place 1 drop into both eyes 2 (two) times daily. For dry eyes  OTC Eye Drops      BP 153/72  Pulse 97  Temp 98.3 F (36.8 C) (Oral)  Resp 20  SpO2 97%  Physical Exam  Constitutional: She is oriented to person, place, and time. She appears well-developed and well-nourished.  HENT:  Head: Normocephalic and atraumatic.       Neck has a stoma - patent as far as i can tell with naked eye. No bleeding.  Eyes: EOM are normal. Pupils are equal, round, and reactive to light.  Neck: Neck supple.  Cardiovascular: Normal rate, regular rhythm and normal heart sounds.   No murmur heard. Pulmonary/Chest: Effort normal. No respiratory distress.  Abdominal: Soft. She exhibits no distension. There is no tenderness. There is no rebound and no guarding.  Neurological: She is alert and oriented to person, place, and time.  Skin: Skin is warm and dry.  ED Course  Procedures (including critical care time)  Labs Reviewed - No data to display No results found.   No diagnosis found.    MDM  Pt with hx of cancer, s/p laryngectomy and a neck stoma for a voice device comes in after her device fell off.  There is no acute airway compromise. The device is not something i am familiar with, and there is a risk of device entering the GI or Airway if not placed properly.  Will call Dr. Pollyann Kennedy, the ENT specialist for further management.         Derwood Kaplan, MD 08/08/12 [phone]

## 2012-08-08 NOTE — ED Notes (Signed)
Laryngectomy  16 year ago. Pt. Never had a trache tube. [facility] system in place. When pt. Was cleaning the stoma, The [facility] came out. Previous hx of coming out and it going down into her lungs. [facility] ENT placed it. It is a 16 Fr. 10 mm 2.4" l x 2.0" wide.

## 2012-08-10 NOTE — Consult Note (Signed)
Patient well known to me. Her TEP dislodged early this morning. She still able to phonate. She did not aspirated. I was able to replace it with minor difficulty after dilating it. I will make arrangements for her to be evaluated at [facility] for re\re size and as I believe that her needs have changed based on the anatomy.

## 2012-09-05 IMAGING — CT CT CHEST W/ CM
1 of 3 series · 15 of 33 positions shown, 19 images · IV contrast (75CC OMNI 300)
Comparison: None.

CLINICAL DATA: Right lung nodules seen on chest radiograph.  Cough.
History of laryngeal carcinoma.  Former smoker.

CT CHEST WITH CONTRAST
TECHNIQUE: Multidetector CT imaging of the chest was performed
following the standard protocol during bolus administration of
intravenous contrast.
Contrast: 75 ml Tmnipaque-XOO

[Series 2: routine chest · axial · 0.59mm/px · z∈[-336,-76]mm · 15 of 58 slices shown, 19 images]
[im 3/58  mediastinal]
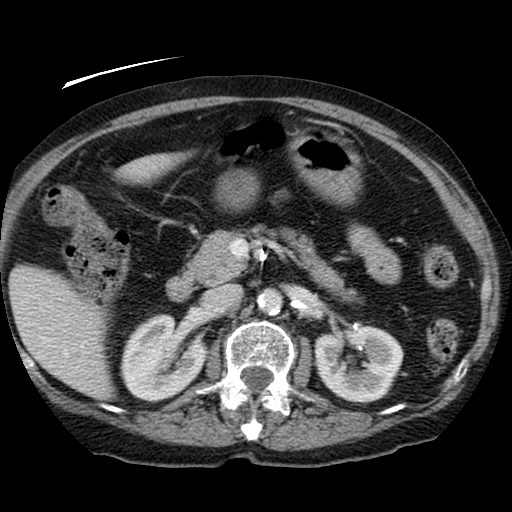
[im 3/58  lung]
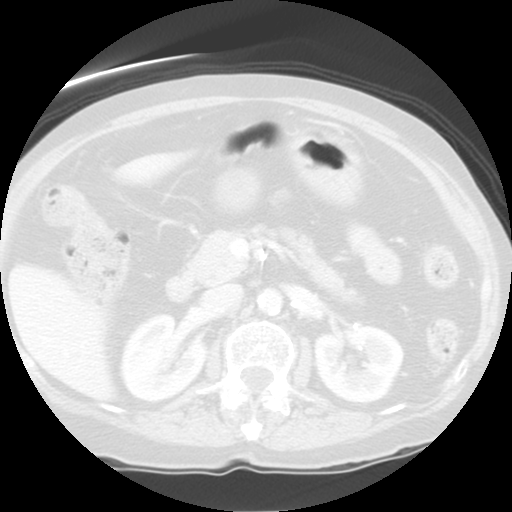
[im 7/58  lung]
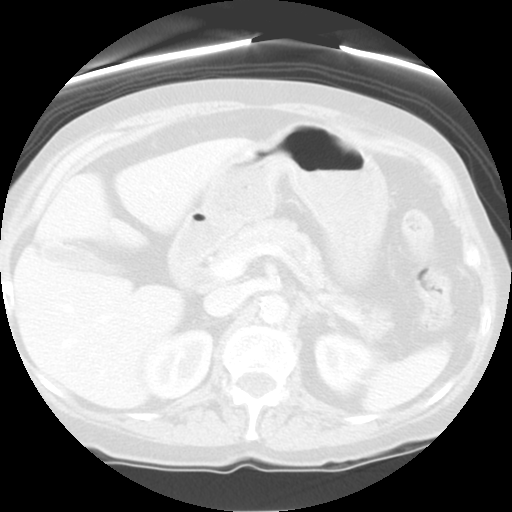
[im 11/58  lung]
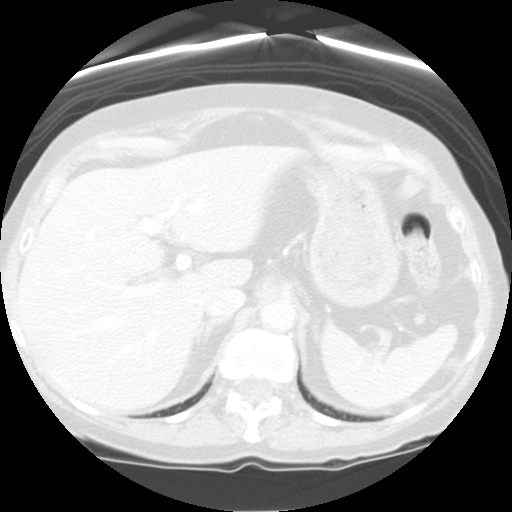
[im 15/58  lung]
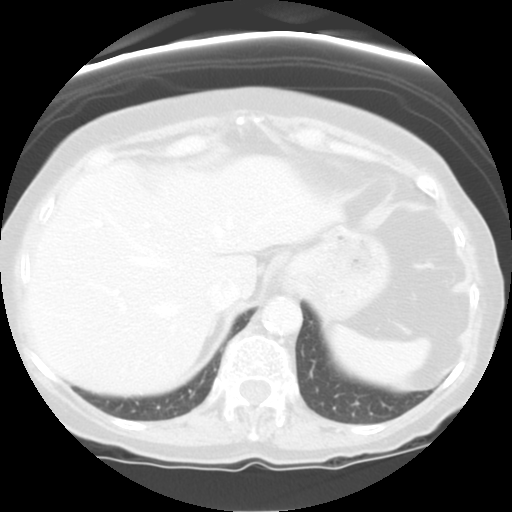
[im 17/58  mediastinal]
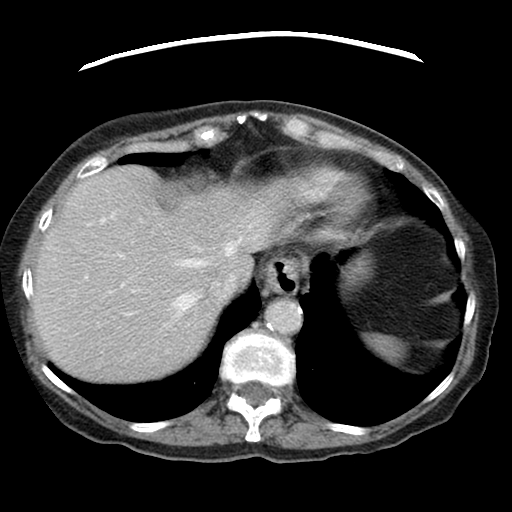
[im 17/58  lung]
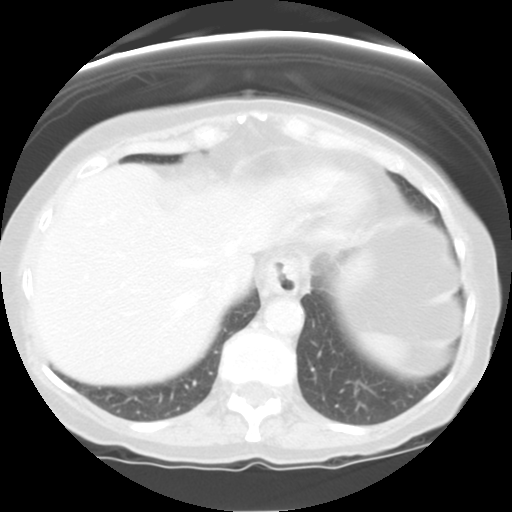
[im 22/58  lung]
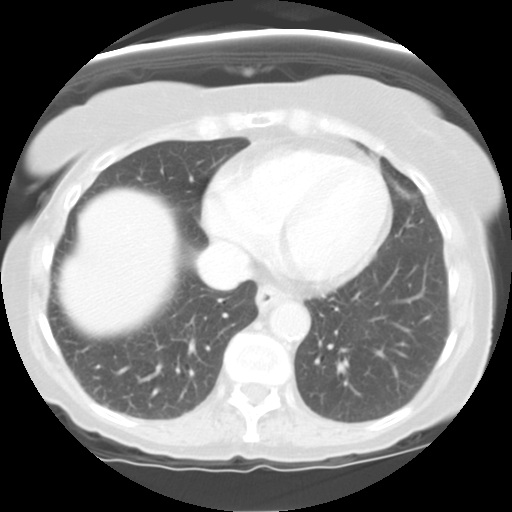
[im 26/58  lung]
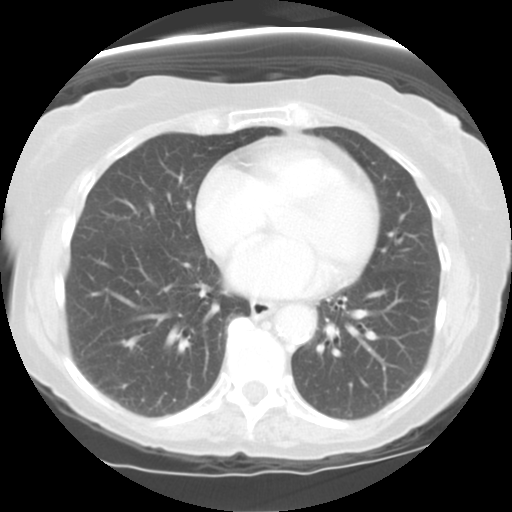
[im 30/58  lung]
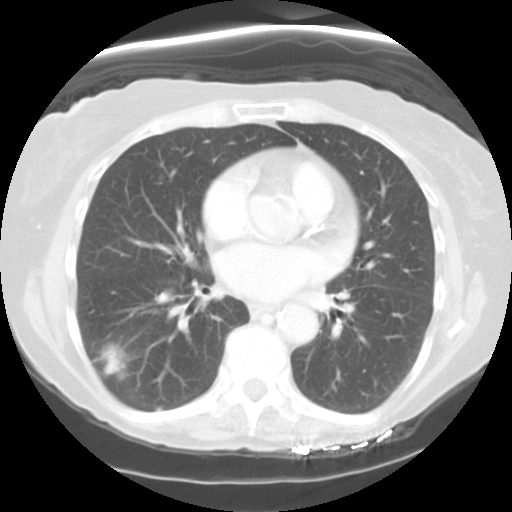
[im 32/58  mediastinal]
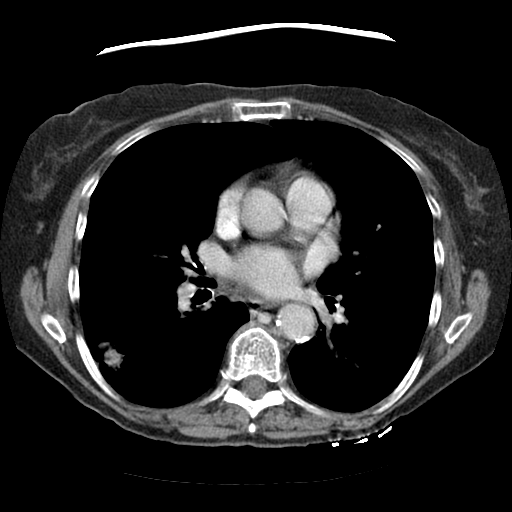
[im 32/58  lung]
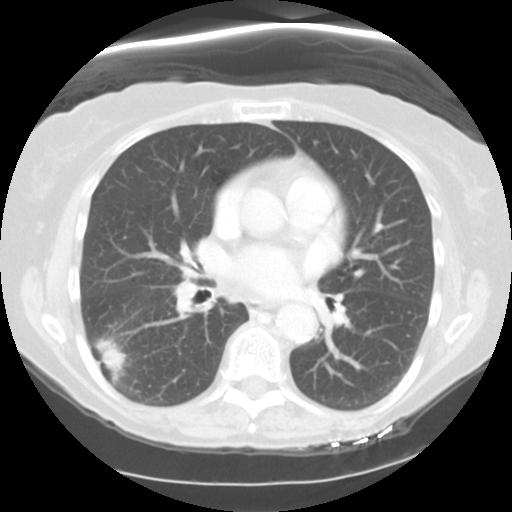
[im 36/58  lung]
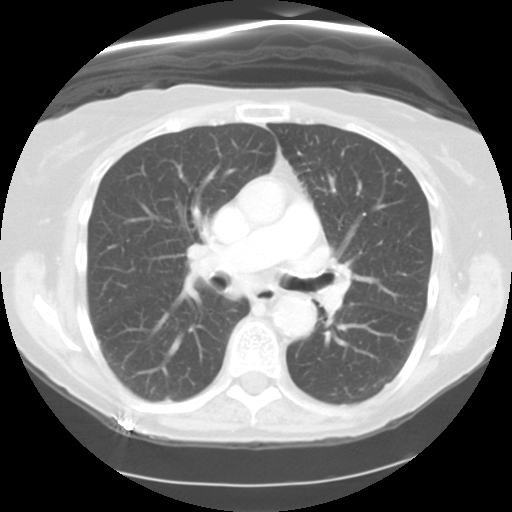
[im 41/58  lung]
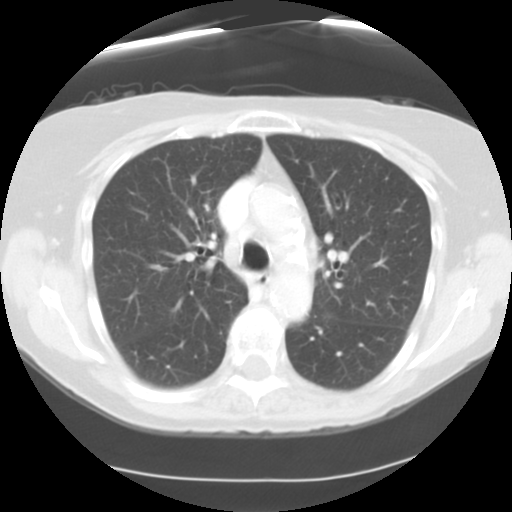
[im 43/58  lung]
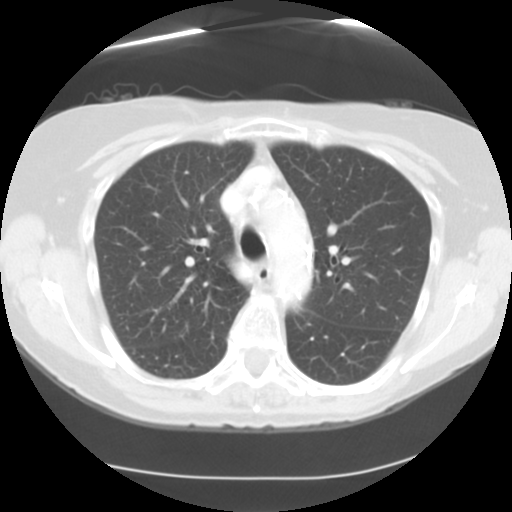
[im 47/58  mediastinal]
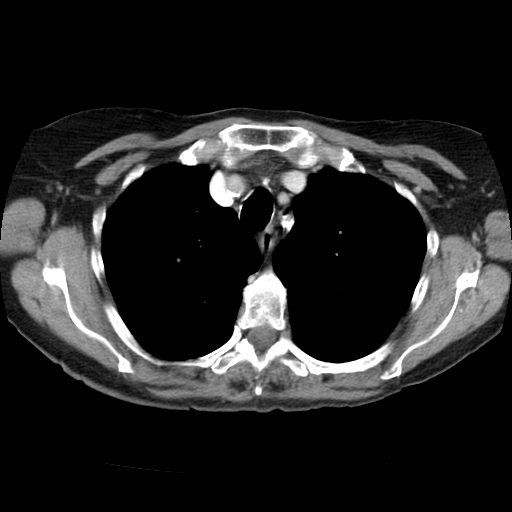
[im 47/58  lung]
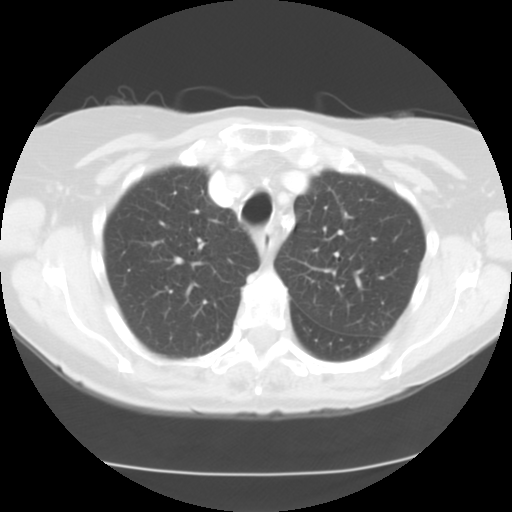
[im 51/58  lung]
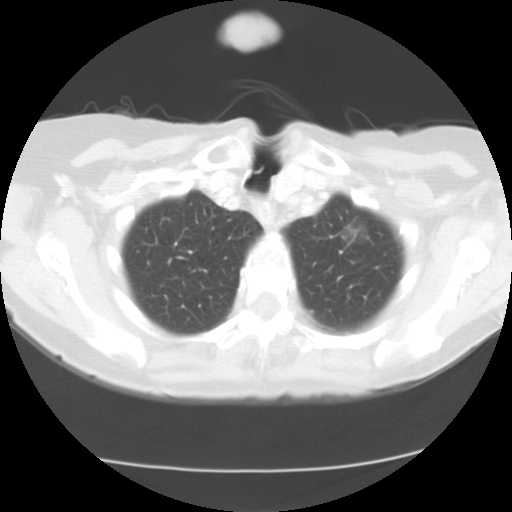
[im 55/58  lung]
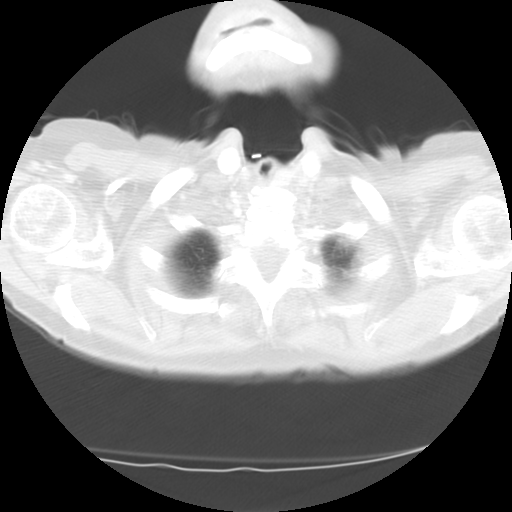

[15 of 33 positions shown; findings below may reference images not displayed]

FINDINGS: A spiculated nodule is seen in the superior right lower
lobe, which measures 2.6 x 1.7 cm.  This is consistent with a
primary bronchogenic carcinoma.  No other solid pulmonary nodules
or masses are identified.  A focal ground-glass opacity is seen in
the anterior left upper lobe on image 8 which measures 15 mm in
diameter.

No evidence of pleural or pericardial effusion.  No evidence of
hilar mass or lymphadenopathy.  11 mm subcarinal mediastinal lymph
node is seen.  No other pathologically enlarged mediastinal lymph
nodes are identified.  No evidence of axillary lymphadenopathy or
chest wall mass.  No suspicious bone lesions are identified.  The
adrenal glands appear normal.  Small hiatal hernia noted.
IMPRESSION: 1.  2.6 cm spiculated nodule in the superior right lower lobe,
consistent with primary bronchogenic carcinoma.
2.  11 mm subcarinal mediastinal lymph node.  PET CT scan is
recommended for further evaluation.
3.  15 mm ground-glass opacity in the anterior left upper lobe.
This may be neoplastic or inflammatory etiology.  Short-term follow-
up by chest CT is recommended.
4.  Small hiatal hernia noted.

## 2012-09-08 ENCOUNTER — Other Ambulatory Visit: Payer: Self-pay | Admitting: Radiation Oncology

## 2012-09-08 ENCOUNTER — Ambulatory Visit (HOSPITAL_COMMUNITY)
Admission: RE | Admit: 2012-09-08 | Discharge: 2012-09-08 | Disposition: A | Discharge status: Home / Self Care | Payer: Medicare Other | Source: Ambulatory Visit | Attending: Radiation Oncology | Admitting: Radiation Oncology

## 2012-09-08 DIAGNOSIS — C3490 Malignant neoplasm of unspecified part of unspecified bronchus or lung: Secondary | ICD-10-CM | POA: Insufficient documentation

## 2012-09-08 DIAGNOSIS — C3430 Malignant neoplasm of lower lobe, unspecified bronchus or lung: Secondary | ICD-10-CM

## 2012-09-08 DIAGNOSIS — E0789 Other specified disorders of thyroid: Secondary | ICD-10-CM | POA: Insufficient documentation

## 2012-09-08 LAB — POCT I-STAT, CHEM 8
Creatinine, Ser: 0.8 mg/dL (ref 0.50–1.10)
Hemoglobin: 13.9 g/dL (ref 12.0–15.0)
Sodium: 142 mEq/L (ref 135–145)
TCO2: 23 mmol/L (ref 0–100)

## 2012-09-08 MED ORDER — IOHEXOL 300 MG/ML  SOLN
80.0000 mL | Freq: Once | INTRAMUSCULAR | Status: AC | PRN
  Administered 2012-09-08: 80 mL via INTRAVENOUS

## 2012-09-11 ENCOUNTER — Ambulatory Visit
Admission: RE | Admit: 2012-09-11 | Discharge: 2012-09-11 | Disposition: A | Discharge status: Home / Self Care | Payer: Medicare Other | Source: Ambulatory Visit | Attending: Radiation Oncology | Admitting: Radiation Oncology

## 2012-09-11 VITALS — BP 127/57 | HR 79 | Temp 98.3°F | Wt 114.0 lb

## 2012-09-11 DIAGNOSIS — C3430 Malignant neoplasm of lower lobe, unspecified bronchus or lung: Secondary | ICD-10-CM

## 2012-09-11 NOTE — Progress Notes (Signed)
Radiation Oncology         [phone]) [phone] ________________________________  Name: Kim Hardy MRN: [id number]  Date: 09/11/2012  DOB: 20-Oct-1931  Follow-Up Visit Note  CC: Herb Grays, MD  Ines Bloomer, MD  Diagnosis:   Stage I non-small cell lung cancer  Interval Since Last Radiation:   approximately1-1/2 years   Narrative:  The patient returns today for routine follow-up.  Patient indicates that she has been doing well. No shortness of breath. No productive cough. No chest pain or new distant pain. The patient had her prosthesis change with regards to her tracheostomy and this has caused or lead to an improvement in her coughing. She is pleased with this. She did have a CT scan of the chest completed recently and she presents today for discussion of these results.                              ALLERGIES:  is allergic to anesthetics, amide; boniva; statins; and sulfa antibiotics.  Meds: Current Outpatient Prescriptions  Medication Sig Dispense Refill  . aspirin EC 81 MG tablet Take 81 mg by mouth at bedtime.      . fenofibrate 160 MG tablet Take 160 mg by mouth daily.      Marland Kitchen OVER THE COUNTER MEDICATION Place 1 drop into both eyes 2 (two) times daily. For dry eyes  OTC Eye Drops        Physical Findings: The patient is in no acute distress. Patient is alert and oriented.  weight is 114 lb (51.71 kg). Her temperature is 98.3 F (36.8 C). Her blood pressure is 127/57 and her pulse is 79. Marland Kitchen   General: Well-developed, in no acute distress HEENT: Normocephalic, atraumatic Neck: tracheostomy site looks good Cardiovascular: Regular rate and rhythm Respiratory: Clear to auscultation bilaterally GI: Soft, nontender, normal bowel sounds Extremities: No edema present  Lab Findings: Lab Results  Component Value Date   WBC 8.6 02/19/2011   HGB 13.9 09/08/2012   HCT 41.0 09/08/2012   MCV 93.4 02/19/2011   PLT 369 02/19/2011     Radiographic Findings: Ct Chest W  Contrast  09/08/2012  *RADIOLOGY REPORT*  Clinical Data: Lung cancer status post radiation therapy.  CT CHEST WITH CONTRAST  Technique:  Multidetector CT imaging of the chest was performed following the standard protocol during bolus administration of intravenous contrast.  Contrast: 80mL OMNIPAQUE IOHEXOL 300 MG/ML  SOLN  Comparison: CT thorax 05/06/2012  Findings: Within the right lower lobe, the wedge shaped linear thickening with air bronchograms extending from hilum to the pleural surface measures 3.5 x 2.5 x 1.8 cm compared to 3.4 x 2.0 x 1.8 cm for no appreciable change in size.  No new pulmonary nodules.  No enlarged mediastinal or hilar lymph nodes.  9 mm right lower paratracheal lymph node is unchanged from 9 mm on prior.  No axillary or supraclavicular lymphadenopathy.  Prior  tracheostomy. Right hemithyroidectomy.  Limited view of the upper abdomen shows normal adrenal glands.  No focal hepatic lesion.  No upper abdominal lymphadenopathy.  Limited view of the skeleton is unremarkable.  IMPRESSION:  1.  No significant change in nodular segmental thickening in the right lower lobe. 2.  No evidence of mediastinal adenopathy. 3.  No change from prior.   Original Report Authenticated By: Genevive Bi, M.D.     Impression:    78 year old female status post radiosurgery for her early stage non-small cell  lung cancer. Her CT scan looked good with no significant change in the chest which was worrisome.  Plan:  We will repeat a CT scan again in 6 months with subsequent followup.  I spent 10 minutes with the patient today, the majority of which was spent counseling the patient on the diagnosis of cancer and coordinating care.   Radene Gunning, M.D., Ph.D.

## 2012-09-11 NOTE — Progress Notes (Signed)
Here for routine follow up of non small cell lung cancer radiation treatment to review ct of chest completed on 09/08/12.Ct reveals no significant change.Patient denies pain, shortness of breath or productive cough.

## 2013-03-29 ENCOUNTER — Other Ambulatory Visit: Payer: Self-pay | Admitting: Radiation Oncology

## 2013-04-02 ENCOUNTER — Ambulatory Visit: Admission: RE | Admit: 2013-04-02 | Payer: Medicare Other | Source: Ambulatory Visit

## 2013-04-02 ENCOUNTER — Ambulatory Visit (HOSPITAL_COMMUNITY): Payer: Medicare Other

## 2013-04-07 ENCOUNTER — Encounter (HOSPITAL_COMMUNITY): Payer: Self-pay

## 2013-04-07 ENCOUNTER — Ambulatory Visit
Admission: RE | Admit: 2013-04-07 | Discharge: 2013-04-07 | Disposition: A | Discharge status: Home / Self Care | Payer: Medicare Other | Source: Ambulatory Visit | Attending: Radiation Oncology | Admitting: Radiation Oncology

## 2013-04-07 ENCOUNTER — Ambulatory Visit (HOSPITAL_COMMUNITY)
Admission: RE | Admit: 2013-04-07 | Discharge: 2013-04-07 | Disposition: A | Discharge status: Home / Self Care | Payer: Medicare Other | Source: Ambulatory Visit | Attending: Radiation Oncology | Admitting: Radiation Oncology

## 2013-04-07 DIAGNOSIS — J479 Bronchiectasis, uncomplicated: Secondary | ICD-10-CM | POA: Insufficient documentation

## 2013-04-07 DIAGNOSIS — C3490 Malignant neoplasm of unspecified part of unspecified bronchus or lung: Secondary | ICD-10-CM | POA: Insufficient documentation

## 2013-04-07 DIAGNOSIS — Z79899 Other long term (current) drug therapy: Secondary | ICD-10-CM | POA: Insufficient documentation

## 2013-04-07 DIAGNOSIS — Z923 Personal history of irradiation: Secondary | ICD-10-CM | POA: Insufficient documentation

## 2013-04-07 DIAGNOSIS — J984 Other disorders of lung: Secondary | ICD-10-CM | POA: Insufficient documentation

## 2013-04-07 DIAGNOSIS — R911 Solitary pulmonary nodule: Secondary | ICD-10-CM | POA: Insufficient documentation

## 2013-04-07 DIAGNOSIS — Z8521 Personal history of malignant neoplasm of larynx: Secondary | ICD-10-CM | POA: Insufficient documentation

## 2013-04-07 DIAGNOSIS — R918 Other nonspecific abnormal finding of lung field: Secondary | ICD-10-CM | POA: Insufficient documentation

## 2013-04-07 DIAGNOSIS — Z7982 Long term (current) use of aspirin: Secondary | ICD-10-CM | POA: Insufficient documentation

## 2013-04-07 DIAGNOSIS — K8020 Calculus of gallbladder without cholecystitis without obstruction: Secondary | ICD-10-CM | POA: Insufficient documentation

## 2013-04-07 LAB — BUN AND CREATININE (CC13): Creatinine: 0.8 mg/dL (ref 0.6–1.1)

## 2013-04-07 MED ORDER — IOHEXOL 300 MG/ML  SOLN
80.0000 mL | Freq: Once | INTRAMUSCULAR | Status: AC | PRN
  Administered 2013-04-07: 80 mL via INTRAVENOUS

## 2013-04-08 ENCOUNTER — Ambulatory Visit
Admission: RE | Admit: 2013-04-08 | Discharge: 2013-04-08 | Disposition: A | Discharge status: Home / Self Care | Payer: Medicare Other | Source: Ambulatory Visit | Attending: Radiation Oncology | Admitting: Radiation Oncology

## 2013-04-08 NOTE — Progress Notes (Signed)
Radiation Oncology         [phone]) [phone] ________________________________  Name: Kim Hardy MRN: [id number]  Date: 04/08/2013  DOB: 1931/08/05  Follow-Up Visit Note  CC: Herb Grays, MD  Ines Bloomer, MD  Diagnosis:   Stage I non-small cell lung cancer  Interval Since Last Radiation:  2 years   Narrative:  The patient returns today for routine follow-up.  The patient indicates that she has done well. No worsening shortness of breath. No pain in the chest area and no difficulty with swallowing.  The patient had a recent CT scan of the chest and she presents today for discussion of this.                              ALLERGIES:  is allergic to anesthetics, amide; boniva; statins; and sulfa antibiotics.  Meds: Current Outpatient Prescriptions  Medication Sig Dispense Refill  . aspirin EC 81 MG tablet Take 81 mg by mouth at bedtime.      . fenofibrate 160 MG tablet Take 160 mg by mouth daily.      Marland Kitchen OVER THE COUNTER MEDICATION Place 1 drop into both eyes 2 (two) times daily. For dry eyes  OTC Eye Drops       No current facility-administered medications for this encounter.    Physical Findings: The patient is in no acute distress. Patient is alert and oriented.  weight is 108 lb 12.8 oz (49.351 kg). Her temperature is 98.6 F (37 C). Her blood pressure is 125/49 and her pulse is 87. Her oxygen saturation is 99%. .   General: Well-developed, in no acute distress HEENT: Normocephalic, atraumatic, trach site clean Cardiovascular: Regular rate and rhythm Respiratory: Clear to auscultation bilaterally GI: Soft, nontender, normal bowel sounds Extremities: No edema present   Lab Findings: Lab Results  Component Value Date   WBC 8.6 02/19/2011   HGB 13.9 09/08/2012   HCT 41.0 09/08/2012   MCV 93.4 02/19/2011   PLT 369 02/19/2011     Radiographic Findings: Ct Chest W Contrast  04/07/2013  *RADIOLOGY REPORT*  Clinical Data: Follow-up lung carcinoma. Complete radiation  therapy in 2012.  Personal history of laryngeal carcinoma.  CT CHEST WITH CONTRAST  Technique:  Multidetector CT imaging of the chest was performed following the standard protocol during bolus administration of intravenous contrast.  Contrast: 80mL OMNIPAQUE IOHEXOL 300 MG/ML  SOLN  Comparison: 09/08/2012  Findings: Scarring and associated mild bronchiectasis is again seen posterior right lower lobe.  However, there is a focal area of consolidation which measures approximately 3.3 x 2.0 cm and shows increased prominence compared to previous study when measured approximately 1.7 x 2.9 cm.  In addition, this shows signs of central low attenuation suggesting necrosis, and is suspicious for recurrent carcinoma.  A focal ground-glass nodule is seen in the anterior left upper lobe on image 8 which measures approximately 16 mm and has not significantly changed.  This is suspicious for adenocarcinoma in situ, although this remains stable compared to multiple previous studies dating back to 01/08/2011.  No evidence of pleural or pericardial effusion.  No evidence of hilar or mediastinal lymphadenopathy.  Shotty less 1 cm mediastinal lymph nodes remain stable. No suspicious bone lesions are identified.  Both adrenal glands are normal in appearance.  Tiny gallstone incidentally noted.  IMPRESSION:  1.  Subtle increase in focal consolidation in the posterior right lower lobe measuring 3.3 cm with possible central  necrosis. This raises suspicion for recurrent carcinoma.  CT guided percutaneous needle biopsy should be considered.  2.  Stable pure ground-glass nodule in the anterior left upper lobe, suspicious for adenocarcinoma in situ.  Continued annual surveillance by CT is recommended.  These recommendations are taken from "Recommendations for the Management of Subsolid Pulmonary Nodules Detected at CT:  A Statement from the Fleischner Society" Radiology 2013; 266:1, [phone].  3.  No evidence of lymphadenopathy or pleural  effusion. 4.  Cholelithiasis incidentally noted.   Original Report Authenticated By: Myles Rosenthal, M.D.     Impression/ Plan:    The patient clinically is doing well. Her CT scan showed a stable left upper lobe abnormality in addition to what was felt by radiology to be a possible subtle change in the region of the right lower lobe. I would like to have the patient's case discussed further at thoracic conference to determine whether further workup would be recommended at this point versus ongoing followup with CT imaging. From my perspective in looking at the scan I would lean towards repeat imaging, possibly of shorter duration than we had done. However I would like to see what the consensus is at conference.  I spent 15 minutes with the patient today, the majority of which was spent counseling the patient on the diagnosis of cancer and coordinating care.   Radene Gunning, M.D., Ph.D.

## 2013-04-08 NOTE — Progress Notes (Signed)
Patient here for routine follow up completion of radiation for NSCLCA.Denies pain.Shortness of breath on exertion only.Here to review ct of chest results from 04/07/2013.No evidence of lymphadenopathy.Increase in consolidation of right lower lobe.., Stable nodule of left upper lobe.

## 2013-04-16 ENCOUNTER — Other Ambulatory Visit: Payer: Self-pay | Admitting: Radiation Oncology

## 2013-04-19 ENCOUNTER — Telehealth: Payer: Self-pay | Admitting: *Deleted

## 2013-04-19 NOTE — Telephone Encounter (Signed)
CALLED PATIENT TO INFORM OF TEST AND FU VISIT, SPOKE WITH PATIENT AND SHE IS AWARE OF THESE APPTS. 

## 2013-06-06 ENCOUNTER — Encounter (HOSPITAL_COMMUNITY): Payer: Self-pay | Admitting: *Deleted

## 2013-06-06 ENCOUNTER — Emergency Department (HOSPITAL_COMMUNITY)
Admission: EM | Admit: 2013-06-06 | Discharge: 2013-06-07 | Disposition: A | Discharge status: Home / Self Care | Payer: Medicare Other | Attending: Emergency Medicine | Admitting: Emergency Medicine

## 2013-06-06 DIAGNOSIS — R3915 Urgency of urination: Secondary | ICD-10-CM | POA: Insufficient documentation

## 2013-06-06 DIAGNOSIS — R509 Fever, unspecified: Secondary | ICD-10-CM | POA: Insufficient documentation

## 2013-06-06 DIAGNOSIS — N390 Urinary tract infection, site not specified: Secondary | ICD-10-CM | POA: Insufficient documentation

## 2013-06-06 DIAGNOSIS — Z923 Personal history of irradiation: Secondary | ICD-10-CM | POA: Insufficient documentation

## 2013-06-06 DIAGNOSIS — M545 Low back pain, unspecified: Secondary | ICD-10-CM | POA: Insufficient documentation

## 2013-06-06 DIAGNOSIS — E7800 Pure hypercholesterolemia, unspecified: Secondary | ICD-10-CM | POA: Insufficient documentation

## 2013-06-06 DIAGNOSIS — Z8521 Personal history of malignant neoplasm of larynx: Secondary | ICD-10-CM | POA: Insufficient documentation

## 2013-06-06 DIAGNOSIS — Z7982 Long term (current) use of aspirin: Secondary | ICD-10-CM | POA: Insufficient documentation

## 2013-06-06 DIAGNOSIS — Z85118 Personal history of other malignant neoplasm of bronchus and lung: Secondary | ICD-10-CM | POA: Insufficient documentation

## 2013-06-06 DIAGNOSIS — Z79899 Other long term (current) drug therapy: Secondary | ICD-10-CM | POA: Insufficient documentation

## 2013-06-06 DIAGNOSIS — Z87891 Personal history of nicotine dependence: Secondary | ICD-10-CM | POA: Insufficient documentation

## 2013-06-06 NOTE — ED Notes (Signed)
Pt thinks she had a UTI, frequent urination, worse tonight

## 2013-06-07 LAB — URINALYSIS, ROUTINE W REFLEX MICROSCOPIC
Bilirubin Urine: NEGATIVE
Ketones, ur: NEGATIVE mg/dL
Nitrite: NEGATIVE
pH: 6.5 (ref 5.0–8.0)

## 2013-06-07 LAB — URINE MICROSCOPIC-ADD ON

## 2013-06-07 MED ORDER — CIPROFLOXACIN HCL 500 MG PO TABS
500.0000 mg | ORAL_TABLET | Freq: Once | ORAL | Status: AC
  Administered 2013-06-07: 500 mg via ORAL
  Filled 2013-06-07: qty 1

## 2013-06-07 MED ORDER — CIPROFLOXACIN HCL 250 MG PO TABS: 250.0000 mg | ORAL_TABLET | Freq: Two times a day (BID) | ORAL | Status: DC

## 2013-06-07 NOTE — ED Provider Notes (Signed)
History     CSN: [id number]  Arrival date & time 06/06/13  2334   First MD Initiated Contact with Patient 06/07/13 0018      Chief Complaint  Patient presents with  . Urinary Frequency    (Consider location/radiation/quality/duration/timing/severity/associated sxs/prior treatment) HPI This is an 79 year old female who developed urinary urgency and burning with urination yesterday afternoon. She attempted to wait until later today to seek help but the symptoms became moderate to severe. She states she's had some mild subjective fever but no chills, nausea or vomiting. She denies hematuria. She has had some mild low back pain but no flank pain. She states she has had urinary tract infections before and this feels similar. She is usually treated with ciprofloxacin.  Past Medical History  Diagnosis Date  . Hypercholesterolemia   . History of radiation therapy 04/01/2011- 4/11/212    right lung  lower lobe  . Allergy     sulfa-hives, boniva=tremors,statins=n/v  . 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 Lung cancer dx'd 01-2011    xrt comp 03/2011    Past Surgical History  Procedure Laterality Date  . Laryngectomy  1997  . Voice prosthesis      No family history on file.  History  Substance Use Topics  . Smoking status: Former Smoker -- 1.00 packs/day for 20 years    Quit date: 03/30/1996  . Smokeless tobacco: Not on file     Comment: quit w/dx laryngeal cancer 1997  . Alcohol Use: No    OB History   Grav Para Term Preterm Abortions TAB SAB Ect Mult Living                  Review of Systems  All other systems reviewed and are negative.    Allergies  Anesthetics, amide; Boniva; Statins; and Sulfa antibiotics  Home Medications   Current Outpatient Rx  Name  Route  Sig  Dispense  Refill  . aspirin EC 81 MG tablet   Oral   Take 81 mg by mouth at bedtime.         . fenofibrate 160 MG tablet   Oral   Take 160 mg by mouth daily.         Marland Kitchen OVER THE COUNTER  MEDICATION   Both Eyes   Place 1 drop into both eyes 2 (two) times daily. For dry eyes  OTC Eye Drops           BP 126/60  Pulse 96  Temp(Src) 99 F (37.2 C) (Oral)  Resp 18  SpO2 100%  Physical Exam General: Well-developed, well-nourished female in no acute distress; appearance consistent with age of record HENT: normocephalic, atraumatic Eyes: pupils equal round and reactive to light; extraocular muscles intact Neck: supple; tracheostomy Heart: regular rate and rhythm Lungs: clear to auscultation bilaterally Abdomen: soft; nondistended; nontender; bowel sounds present GU: No flank tenderness Extremities: No deformity; full range of motion; pulses normal; no edema Neurologic: Awake, alert and oriented; motor function intact in all extremities and symmetric; no facial droop Skin: Warm and dry Psychiatric: Normal mood and affect    ED Course  Procedures (including critical care time)     MDM   Nursing notes and vitals signs, including pulse oximetry, reviewed.  Summary of this visit's results, reviewed by myself:  Labs:  Results for orders placed during the hospital encounter of 06/06/13 (from the past 24 hour(s))  URINALYSIS, ROUTINE W REFLEX MICROSCOPIC  Status: Abnormal   Collection Time    06/06/13 11:49 PM      Result Value Range   Color, Urine YELLOW  YELLOW   APPearance TURBID (*) CLEAR   Specific Gravity, Urine 1.011  1.005 - 1.030   pH 6.5  5.0 - 8.0   Glucose, UA NEGATIVE  NEGATIVE mg/dL   Hgb urine dipstick LARGE (*) NEGATIVE   Bilirubin Urine NEGATIVE  NEGATIVE   Ketones, ur NEGATIVE  NEGATIVE mg/dL   Protein, ur 782 (*) NEGATIVE mg/dL   Urobilinogen, UA 0.2  0.0 - 1.0 mg/dL   Nitrite NEGATIVE  NEGATIVE   Leukocytes, UA LARGE (*) NEGATIVE  URINE MICROSCOPIC-ADD ON     Status: Abnormal   Collection Time    06/06/13 11:49 PM      Result Value Range   WBC, UA TOO NUMEROUS TO COUNT  <3 WBC/hpf   RBC / HPF 7-10  <3 RBC/hpf   Bacteria, UA FEW  (*) RARE   Urine-Other FIELD OBSCURED BY WBC'S             Carlisle Beers Violet Cart, MD 06/07/13 [id number]

## 2013-06-09 LAB — URINE CULTURE

## 2013-06-11 NOTE — ED Notes (Signed)
Post ED Visit - Positive Culture Follow-up  Culture report reviewed by antimicrobial stewardship pharmacist: [x]  Kim Hardy, Pharm.D., BCPS []  Kim Hardy, [address].D., BCPS []  Kim Hardy, Pharm.D., BCPS []  [place], [address].D., BCPS, AAHIVP []  Kim Hardy, Pharm.D., BCPS, AAHIVP  Positive urine culture Treated with Ciprofloxacin, organism sensitive to the same and no further patient follow-up is required at this time.  [name] 06/11/2013, 11:52 AM

## 2013-07-14 ENCOUNTER — Telehealth: Payer: Self-pay | Admitting: *Deleted

## 2013-07-14 NOTE — Telephone Encounter (Signed)
CALLED PATIENT TO ASK HER TO COME IN ON Friday MORNING FOR STAT LABS, PATIENT AGREED TO COME IN ON 07-16-13 AT 8:30 AM

## 2013-07-15 ENCOUNTER — Other Ambulatory Visit: Payer: Self-pay | Admitting: Radiation Oncology

## 2013-07-15 DIAGNOSIS — C801 Malignant (primary) neoplasm, unspecified: Secondary | ICD-10-CM

## 2013-07-16 ENCOUNTER — Encounter (HOSPITAL_COMMUNITY): Payer: Self-pay

## 2013-07-16 ENCOUNTER — Other Ambulatory Visit (HOSPITAL_COMMUNITY): Payer: Medicare Other

## 2013-07-16 ENCOUNTER — Ambulatory Visit
Admission: RE | Admit: 2013-07-16 | Discharge: 2013-07-16 | Disposition: A | Discharge status: Home / Self Care | Payer: PRIVATE HEALTH INSURANCE | Source: Ambulatory Visit | Attending: Radiation Oncology | Admitting: Radiation Oncology

## 2013-07-16 ENCOUNTER — Ambulatory Visit (HOSPITAL_COMMUNITY)
Admission: RE | Admit: 2013-07-16 | Discharge: 2013-07-16 | Disposition: A | Discharge status: Home / Self Care | Payer: PRIVATE HEALTH INSURANCE | Source: Ambulatory Visit | Attending: Radiation Oncology | Admitting: Radiation Oncology

## 2013-07-16 DIAGNOSIS — M47814 Spondylosis without myelopathy or radiculopathy, thoracic region: Secondary | ICD-10-CM | POA: Insufficient documentation

## 2013-07-16 DIAGNOSIS — J438 Other emphysema: Secondary | ICD-10-CM | POA: Insufficient documentation

## 2013-07-16 DIAGNOSIS — Z923 Personal history of irradiation: Secondary | ICD-10-CM | POA: Insufficient documentation

## 2013-07-16 DIAGNOSIS — C801 Malignant (primary) neoplasm, unspecified: Secondary | ICD-10-CM

## 2013-07-16 DIAGNOSIS — I700 Atherosclerosis of aorta: Secondary | ICD-10-CM | POA: Insufficient documentation

## 2013-07-16 DIAGNOSIS — I2510 Atherosclerotic heart disease of native coronary artery without angina pectoris: Secondary | ICD-10-CM | POA: Insufficient documentation

## 2013-07-16 DIAGNOSIS — C3490 Malignant neoplasm of unspecified part of unspecified bronchus or lung: Secondary | ICD-10-CM | POA: Insufficient documentation

## 2013-07-16 DIAGNOSIS — Z9089 Acquired absence of other organs: Secondary | ICD-10-CM | POA: Insufficient documentation

## 2013-07-16 MED ORDER — IOHEXOL 300 MG/ML  SOLN
80.0000 mL | Freq: Once | INTRAMUSCULAR | Status: AC | PRN
  Administered 2013-07-16: 80 mL via INTRAVENOUS

## 2013-07-22 ENCOUNTER — Encounter: Payer: Self-pay | Admitting: Radiation Oncology

## 2013-07-22 ENCOUNTER — Telehealth: Payer: Self-pay | Admitting: *Deleted

## 2013-07-22 ENCOUNTER — Ambulatory Visit
Admission: RE | Admit: 2013-07-22 | Discharge: 2013-07-22 | Disposition: A | Discharge status: Home / Self Care | Payer: Medicare Other | Source: Ambulatory Visit | Attending: Radiation Oncology | Admitting: Radiation Oncology

## 2013-07-22 NOTE — Progress Notes (Signed)
Radiation Oncology         [phone]) [phone] ________________________________  Name: Kim Hardy MRN: [id number]  Date: 07/22/2013  DOB: 1931-05-20  Follow-Up Visit Note  CC: Herb Grays, MD  Ines Bloomer, MD  Diagnosis:   Stage I non-small cell lung cancer  Interval Since Last Radiation:  2 and half years   Narrative:  The patient returns today for routine follow-up.  The patient states that she has done very well. She indicates that she has some occasional shortness of breath when it is very hot outside. She states that this has been a chronic issue. No significant changes in this. She denies any pain in the chest area. The patient had a recent CT scan completed and she presents today for discussion.                              ALLERGIES:  is allergic to anesthetics, amide; boniva; statins; and sulfa antibiotics.  Meds: Current Outpatient Prescriptions  Medication Sig Dispense Refill  . aspirin EC 81 MG tablet Take 81 mg by mouth at bedtime.      . fenofibrate 160 MG tablet Take 160 mg by mouth daily.      Marland Kitchen OVER THE COUNTER MEDICATION Place 1 drop into both eyes 2 (two) times daily. For dry eyes  OTC Eye Drops       No current facility-administered medications for this encounter.    Physical Findings: The patient is in no acute distress. Patient is alert and oriented.  height is 5' (1.524 m) and weight is 107 lb 11.2 oz (48.852 kg). Her temperature is 98 F (36.7 C). Her blood pressure is 117/48 and her pulse is 94. Her oxygen saturation is 100%. .   General: Well-developed, in no acute distress HEENT: Normocephalic, atraumatic, trach present Cardiovascular: Regular rate and rhythm Respiratory: Clear to auscultation bilaterally GI: Soft, nontender, normal bowel sounds Extremities: No edema present   Lab Findings: Lab Results  Component Value Date   WBC 8.6 02/19/2011   HGB 13.9 09/08/2012   HCT 41.0 09/08/2012   MCV 93.4 02/19/2011   PLT 369 02/19/2011      Radiographic Findings: Ct Chest W Contrast  07/16/2013   *RADIOLOGY REPORT*  Clinical Data: Right lung cancer and laryngeal cancer.  Prior radiotherapy of the right lung cancer completed in April, 2013.  CT CHEST WITH CONTRAST  Technique:  Multidetector CT imaging of the chest was performed following the standard protocol during bolus administration of intravenous contrast.  Contrast: 80mL OMNIPAQUE IOHEXOL 300 MG/ML  SOLN  Comparison: 04/07/2013  Findings: Wide laryngectomy noted with stable soft tissue defect and suspected small tracheostomy.  Lower paratracheal node 0.9 cm short axis, previously 0.8 cm.  Aortic and coronary artery atherosclerosis noted.  Emphysema is present.  Primarily triangular region of consolidation and volume loss in the right lower lobe with an internal region of central low density is best measured by transverse width although even that it is subject to variability based on scan plane.  The band of opacity is 2.6 cm in width on image 31 of series 5, compared to 2.5 cm when measured in a similar fashion on the prior exam. Vertical excursion of regional opacity 2.2 cm as measured on image 51 of series 602; on 09/08/2012 this measured 1.8 cm.  Thoracic spondylosis noted.  Mild lingular scarring noted.  1.5 cm ground-glass density left apical nodule noted, image 7  of series 5.  IMPRESSION:  1.  Subtle increase in the focal consolidation in the right lower lobe corresponding to treated lesion, query recurrence. 2.  Stable subtle ground-glass nodule in the right upper lobe, similar to 01/08/2011, suspicious for possible carcinoma in situ. 3.  Atherosclerosis.   Original Report Authenticated By: Gaylyn Rong, M.D.    Impression:    The patient is doing satisfactorily clinically with no significant new symptoms. There was a subtle increase in the focal consolidation within the right lower lobe in the treatment area. Also a stable groundglass nodule in the left upper lobe which  has continued to be followed. We discussed and thoracic multidisciplinary conference the patient's prior CT scan. It was felt that ongoing surveillance was reasonable given no major changes in this area. With a subtle changes seen on the patient's recent scan, I believe that we can continue to follow her with followup imaging.  Plan:  Repeat CT scan in 4-5 months, then followup appointment.  I spent 10 minutes with the patient today, the majority of which was spent counseling the patient on the diagnosis of cancer and coordinating care.   Radene Gunning, M.D., Ph.D.

## 2013-07-22 NOTE — Addendum Note (Signed)
Encounter addended by: Jonna Coup, MD on: 07/22/2013  2:40 PM<BR>     Documentation filed: Visit Diagnoses

## 2013-07-22 NOTE — Progress Notes (Signed)
Ms. Kim Hardy here today for reassessment following radiation therapy for Non-Small Cell Lung Cancer.  She denies any pain nor SOB.  VSS.  O2 Sat 100% on RA.  Tracheal stoma patent.  Here to discuss 07/16/13 CT Scan.

## 2013-07-22 NOTE — Telephone Encounter (Signed)
CALLED PATIENT TO INFORM OF TEST AND FU VISIT, SPOKE WITH PATIENT AND SHE IS AWARE OF THE APPTS.

## 2013-11-16 ENCOUNTER — Encounter (HOSPITAL_COMMUNITY): Payer: Self-pay | Admitting: Emergency Medicine

## 2013-11-16 ENCOUNTER — Emergency Department (INDEPENDENT_AMBULATORY_CARE_PROVIDER_SITE_OTHER)
Admission: EM | Admit: 2013-11-16 | Discharge: 2013-11-16 | Disposition: A | Discharge status: Home / Self Care | Payer: PRIVATE HEALTH INSURANCE | Source: Home / Self Care | Attending: Family Medicine | Admitting: Family Medicine

## 2013-11-16 DIAGNOSIS — N390 Urinary tract infection, site not specified: Secondary | ICD-10-CM

## 2013-11-16 LAB — POCT URINALYSIS DIP (DEVICE)
Ketones, ur: NEGATIVE mg/dL
Nitrite: NEGATIVE
Protein, ur: NEGATIVE mg/dL
Specific Gravity, Urine: 1.02 (ref 1.005–1.030)
Urobilinogen, UA: 0.2 mg/dL (ref 0.0–1.0)
pH: 7 (ref 5.0–8.0)

## 2013-11-16 MED ORDER — CIPROFLOXACIN HCL 500 MG PO TABS: 500.0000 mg | ORAL_TABLET | Freq: Two times a day (BID) | ORAL | Status: DC

## 2013-11-16 NOTE — ED Provider Notes (Signed)
Kim Hardy is a 79 y.o. female who presents to Urgent Care today for OC urgency and dysuria. This started 2 days ago. She has tried AZO pills which helps some. Her symptoms are consistent with prior urinary tract infection. She denies any fevers chills nausea vomiting or diarrhea. She feels well otherwise. Patient prefers Cipro as that typically works.   Past Medical History  Diagnosis Date  . Hypercholesterolemia   . History of radiation therapy 04/01/2011- 4/11/212    right lung  lower lobe  . Allergy     sulfa-hives, boniva=tremors,statins=n/v  . 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 Lung cancer dx'd 01-2011    xrt comp 03/2011   History  Substance Use Topics  . Smoking status: Former Smoker -- 1.00 packs/day for 20 years    Quit date: 03/30/1996  . Smokeless tobacco: Not on file     Comment: quit w/dx laryngeal cancer 1997  . Alcohol Use: No   ROS as above Medications reviewed. No current facility-administered medications for this encounter.   Current Outpatient Prescriptions  Medication Sig Dispense Refill  . fenofibrate 160 MG tablet Take 160 mg by mouth daily.      Marland Kitchen aspirin EC 81 MG tablet Take 81 mg by mouth at bedtime.      . ciprofloxacin (CIPRO) 500 MG tablet Take 1 tablet (500 mg total) by mouth 2 (two) times daily.  10 tablet  0  . OVER THE COUNTER MEDICATION Place 1 drop into both eyes 2 (two) times daily. For dry eyes  OTC Eye Drops        Exam:  BP 141/54  Pulse 101  Temp(Src) 97.9 F (36.6 C) (Oral)  Resp 16  SpO2 100% Gen: Well NAD HEENT: EOMI,  MMM, larynx stoma is normal-appearing Lungs: CTABL Nl WOB Heart: RRR no MRG Abd: NABS, NT, ND, soft no CV angle tenderness to percussion Exts: Non edematous BL  LE, warm and well perfused.   Results for orders placed during the hospital encounter of 11/16/13 (from the past 24 hour(s))  POCT URINALYSIS DIP (DEVICE)     Status: Abnormal   Collection Time    11/16/13  2:26 PM      Result Value Range   Glucose, UA NEGATIVE  NEGATIVE mg/dL   Bilirubin Urine NEGATIVE  NEGATIVE   Ketones, ur NEGATIVE  NEGATIVE mg/dL   Specific Gravity, Urine 1.020  1.005 - 1.030   Hgb urine dipstick TRACE (*) NEGATIVE   pH 7.0  5.0 - 8.0   Protein, ur NEGATIVE  NEGATIVE mg/dL   Urobilinogen, UA 0.2  0.0 - 1.0 mg/dL   Nitrite NEGATIVE  NEGATIVE   Leukocytes, UA LARGE (*) NEGATIVE   No results found.  Assessment and Plan: 79 y.o. female with UTI. Culture pending. Plan to use Cipro as patient tolerates that well and prefers it. Followup with primary care provider as needed. Discussed warning signs or symptoms. Please see discharge instructions. Patient expresses understanding.      Rodolph Bong, MD 11/16/13 1440

## 2013-11-16 NOTE — ED Notes (Signed)
Pt c/o poss UTI onset Sunday night Sxs include: dysuria, urinary freq/urgency, back pain Denies: hematuria, abd pain, f/v/n/d Alert w/no signs of acute distress... Steady gain w/NAD

## 2013-11-17 LAB — URINE CULTURE: Colony Count: >=100000

## 2013-11-18 NOTE — ED Notes (Signed)
Urine culture: >100,000 colonies Enterobacter Cloacae.  Pt. adequately treated with Cipro. Vassie Moselle 11/18/2013

## 2013-12-06 ENCOUNTER — Ambulatory Visit: Admission: RE | Admit: 2013-12-06 | Payer: Medicare Other | Source: Ambulatory Visit

## 2013-12-06 ENCOUNTER — Ambulatory Visit (HOSPITAL_COMMUNITY): Admission: RE | Admit: 2013-12-06 | Payer: PRIVATE HEALTH INSURANCE | Source: Ambulatory Visit

## 2013-12-07 ENCOUNTER — Telehealth: Payer: Self-pay | Admitting: *Deleted

## 2013-12-07 NOTE — Telephone Encounter (Signed)
CALLED PATIENT TO ALTER FU VISIT FOR 12-09-13 DUE TO HER TEST BEING RESCHEDULED FOR 12-16-13, FU APPT. MOVED TO 12-29-13, PATIENT IS GOOD WITH THIS DATE AND TIME.

## 2013-12-09 ENCOUNTER — Ambulatory Visit: Payer: Medicare Other | Admitting: Radiation Oncology

## 2013-12-16 ENCOUNTER — Ambulatory Visit (HOSPITAL_COMMUNITY)
Admission: RE | Admit: 2013-12-16 | Discharge: 2013-12-16 | Disposition: A | Discharge status: Home / Self Care | Payer: PRIVATE HEALTH INSURANCE | Source: Ambulatory Visit | Attending: Radiation Oncology | Admitting: Radiation Oncology

## 2013-12-16 ENCOUNTER — Encounter (HOSPITAL_COMMUNITY): Payer: Self-pay

## 2013-12-16 ENCOUNTER — Encounter (INDEPENDENT_AMBULATORY_CARE_PROVIDER_SITE_OTHER): Payer: Self-pay

## 2013-12-16 ENCOUNTER — Ambulatory Visit
Admission: RE | Admit: 2013-12-16 | Discharge: 2013-12-16 | Disposition: A | Discharge status: Home / Self Care | Payer: PRIVATE HEALTH INSURANCE | Source: Ambulatory Visit | Attending: Radiation Oncology | Admitting: Radiation Oncology

## 2013-12-16 DIAGNOSIS — J90 Pleural effusion, not elsewhere classified: Secondary | ICD-10-CM | POA: Insufficient documentation

## 2013-12-16 DIAGNOSIS — I700 Atherosclerosis of aorta: Secondary | ICD-10-CM | POA: Insufficient documentation

## 2013-12-16 DIAGNOSIS — Z923 Personal history of irradiation: Secondary | ICD-10-CM | POA: Insufficient documentation

## 2013-12-16 DIAGNOSIS — I2510 Atherosclerotic heart disease of native coronary artery without angina pectoris: Secondary | ICD-10-CM | POA: Insufficient documentation

## 2013-12-16 DIAGNOSIS — M47814 Spondylosis without myelopathy or radiculopathy, thoracic region: Secondary | ICD-10-CM | POA: Insufficient documentation

## 2013-12-16 DIAGNOSIS — C3430 Malignant neoplasm of lower lobe, unspecified bronchus or lung: Secondary | ICD-10-CM | POA: Insufficient documentation

## 2013-12-16 DIAGNOSIS — C3490 Malignant neoplasm of unspecified part of unspecified bronchus or lung: Secondary | ICD-10-CM | POA: Insufficient documentation

## 2013-12-16 LAB — BUN AND CREATININE (CC13): BUN: 22 mg/dL (ref 7.0–26.0)

## 2013-12-16 MED ORDER — IOHEXOL 300 MG/ML  SOLN
80.0000 mL | Freq: Once | INTRAMUSCULAR | Status: AC | PRN
  Administered 2013-12-16: 80 mL via INTRAVENOUS

## 2013-12-29 ENCOUNTER — Encounter: Payer: Self-pay | Admitting: Radiation Oncology

## 2013-12-29 ENCOUNTER — Ambulatory Visit
Admission: RE | Admit: 2013-12-29 | Discharge: 2013-12-29 | Disposition: A | Discharge status: Home / Self Care | Payer: PRIVATE HEALTH INSURANCE | Source: Ambulatory Visit | Attending: Radiation Oncology | Admitting: Radiation Oncology

## 2013-12-29 NOTE — Progress Notes (Signed)
Radiation Oncology         [phone] ________________________________  Name: Kim Hardy MRN: [id number]  Date: 12/29/2013  DOB: 1931/09/30  Follow-Up Visit Note  CC: Dorrene German, MD  Herb Grays, MD  Diagnosis:   Stage I non-small cell lung cancer  Interval Since Last Radiation:  Radiation completed on 04/10/2011   Narrative:  The patient returns today for routine follow-up.  The patient states that she has been doing well. The patient notes no major changes in terms of shortness of breath. No pain in the chest area and no unexplained elsewhere. She had a recent CT scan of the chest and she presents today for discussion of this study.                              ALLERGIES:  is allergic to anesthetics, amide; boniva; statins; and sulfa antibiotics.  Meds: Current Outpatient Prescriptions  Medication Sig Dispense Refill  . aspirin EC 81 MG tablet Take 81 mg by mouth at bedtime.      Marland Kitchen OVER THE COUNTER MEDICATION Place 1 drop into both eyes 2 (two) times daily. For dry eyes  OTC Eye Drops      . fenofibrate 160 MG tablet Take 160 mg by mouth daily.       No current facility-administered medications for this encounter.    Physical Findings: The patient is in no acute distress. Patient is alert and oriented.  height is 5\' 1"  (1.549 m) and weight is 108 lb 4.8 oz (49.125 kg). Her oral temperature is 98.1 F (36.7 C). Her blood pressure is 123/62 and her pulse is 90. Her respiration is 20 and oxygen saturation is 100%. .   Tracheostomy site clean. Clear to auscultation bilaterally  Lab Findings: Lab Results  Component Value Date   WBC 8.6 02/19/2011   HGB 13.9 09/08/2012   HCT 41.0 09/08/2012   MCV 93.4 02/19/2011   PLT 369 02/19/2011     Radiographic Findings: Ct Chest W Contrast  12/16/2013   CLINICAL DATA:  Right lung cancer, diagnosed 2013, status post XRT  EXAM: CT CHEST WITH CONTRAST  TECHNIQUE: Multidetector CT imaging of the chest was performed during  intravenous contrast administration.  CONTRAST:  80mL OMNIPAQUE IOHEXOL 300 MG/ML  SOLN  COMPARISON:  07/16/2013  FINDINGS: Band-like opacity in the posterior right lower lobe, ill-defined but measuring approximately 3.4 x 2.4 cm, grossly unchanged (previously 3.2 x 2.6 cm). This corresponds to the prior treated lesion with superimposed radiation changes. Again noted is suspected central necrosis within the lesion (series 2/image 30). Associated Kim right pleural fluid (series 2/image 29), new/increased.  Vague 14 mm ground-glass opacity in the left lung apex (series 5/image 8), without associated soft tissue component, unchanged.  Underlying mild centrilobular emphysematous changes. No pneumothorax.  Prior tracheostomy.  The heart is normal in size. No pericardial effusion. Coronary atherosclerosis. Atherosclerotic calcification of the aortic arch.  7 mm short axis right paratracheal node (series 2/ image 19), grossly unchanged.  Visualized upper abdomen is unremarkable.  Mild degenerative changes of the thoracic spine.  IMPRESSION: 3.4 x 2.4 cm band-like opacity in the posterior right lower lobe, corresponding to prior treated lesion with superimposed radiation changes, grossly unchanged.  Associated Kim right pleural fluid, new/increased.  Stable 14 mm ground-glass opacity in the left lung apex.   Electronically Signed   By: Charline Bills M.D.   On: 12/16/2013 11:25  Impression:    The patient clinically is doing well. No sign of recurrence or progression currently. I have personally reviewed her CT scan which looked good. We will continue ongoing followup.  Plan:  CT scan of the chest in 6 months, then followup appointment.  I spent 15 minutes with the patient today, the majority of which was spent counseling the patient on the diagnosis of cancer and coordinating care.   Radene Gunning, M.D., Ph.D.

## 2013-12-29 NOTE — Progress Notes (Signed)
Follow up Ct chest report 12/16/13, rt lung cancer, no c/o pain, fair appetite, 100% room air sats, energy level fair stated no c/o pain or discomfort  1:59 PM

## 2013-12-31 ENCOUNTER — Telehealth: Payer: Self-pay | Admitting: *Deleted

## 2013-12-31 NOTE — Telephone Encounter (Signed)
CALLED PATIENT TO INFORM OF TEST AND FU VISIT, SPOKE WITH PATIENT AND SHE IS AWARE OF THIS TEST AND FU

## 2014-04-30 ENCOUNTER — Encounter (HOSPITAL_COMMUNITY): Payer: Self-pay | Admitting: Emergency Medicine

## 2014-04-30 ENCOUNTER — Emergency Department (INDEPENDENT_AMBULATORY_CARE_PROVIDER_SITE_OTHER)
Admission: EM | Admit: 2014-04-30 | Discharge: 2014-04-30 | Disposition: A | Discharge status: Home / Self Care | Payer: PRIVATE HEALTH INSURANCE | Source: Home / Self Care | Attending: Emergency Medicine | Admitting: Emergency Medicine

## 2014-04-30 DIAGNOSIS — N390 Urinary tract infection, site not specified: Secondary | ICD-10-CM

## 2014-04-30 LAB — POCT URINALYSIS DIP (DEVICE)
GLUCOSE, UA: 100 mg/dL — AB
NITRITE: POSITIVE — AB
Protein, ur: 100 mg/dL — AB
SPECIFIC GRAVITY, URINE: 1.015 (ref 1.005–1.030)
UROBILINOGEN UA: 4 mg/dL — AB (ref 0.0–1.0)
pH: 5 (ref 5.0–8.0)

## 2014-04-30 MED ORDER — ONDANSETRON 4 MG PO TBDP
ORAL_TABLET | ORAL | Status: AC
  Filled 2014-04-30: qty 1

## 2014-04-30 MED ORDER — CEPHALEXIN 500 MG PO CAPS: 500.0000 mg | ORAL_CAPSULE | Freq: Four times a day (QID) | ORAL | Status: DC

## 2014-04-30 MED ORDER — ONDANSETRON HCL 4 MG PO TABS: 4.0000 mg | ORAL_TABLET | Freq: Four times a day (QID) | ORAL | Status: DC

## 2014-04-30 MED ORDER — ONDANSETRON 4 MG PO TBDP
4.0000 mg | ORAL_TABLET | Freq: Once | ORAL | Status: AC
  Administered 2014-04-30: 4 mg via ORAL

## 2014-04-30 NOTE — ED Provider Notes (Signed)
CSN: [id number]     Arrival date & time 04/30/14  1210 History   First MD Initiated Contact with Patient 04/30/14 1327     No chief complaint on file.  (Consider location/radiation/quality/duration/timing/severity/associated sxs/prior Treatment) HPI Comments: 80 y o F c/o dysuria, urinary frequency, urgency and nausea for 3 d.    Past Medical History  Diagnosis Date  . Hypercholesterolemia   . History of radiation therapy 04/01/2011- 4/11/212    right lung  lower lobe  . Allergy     sulfa-hives, boniva=tremors,statins=n/v  . 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 Lung cancer dx'd 01-2011    xrt comp 03/2011   Past Surgical History  Procedure Laterality Date  . Laryngectomy  1997  . Voice prosthesis     No family history on file. History  Substance Use Topics  . Smoking status: Former Smoker -- 1.00 packs/day for 20 years    Quit date: 03/30/1996  . Smokeless tobacco: Not on file     Comment: quit w/dx laryngeal cancer 1997  . Alcohol Use: No   OB History   Grav Para Term Preterm Abortions TAB SAB Ect Mult Living                 Review of Systems  Constitutional: Negative.   HENT: Negative.   Cardiovascular: Negative.   Genitourinary:       As per HPI  Skin: Negative.   All other systems reviewed and are negative.   Allergies  Anesthetics, amide; Boniva; Statins; and Sulfa antibiotics  Home Medications   Prior to Admission medications   Medication Sig Start Date End Date Taking? Authorizing Provider  aspirin EC 81 MG tablet Take 81 mg by mouth at bedtime.    Historical Provider, MD  fenofibrate 160 MG tablet Take 160 mg by mouth daily.    Historical Provider, MD  OVER THE COUNTER MEDICATION Place 1 drop into both eyes 2 (two) times daily. For dry eyes  OTC Eye Drops    Historical Provider, MD   BP 126/67  Pulse 95  Temp(Src) 98.5 F (36.9 C) (Oral)  Resp 16  SpO2 95% Physical Exam  Nursing note and vitals reviewed. Constitutional: She is oriented to  person, place, and time. She appears well-developed and well-nourished. No distress.  HENT:  Has permanent trach  Cardiovascular: Normal rate.   Pulmonary/Chest: Effort normal. No respiratory distress.  Musculoskeletal: She exhibits no edema.  Neurological: She is alert and oriented to person, place, and time.  Skin: Skin is warm and dry.  Psychiatric: She has a normal mood and affect.    ED Course  Procedures (including critical care time) Labs Review Labs Reviewed  POCT URINALYSIS DIP (DEVICE) - Abnormal; Notable for the following:    Glucose, UA 100 (*)    Bilirubin Urine SMALL (*)    Ketones, ur TRACE (*)    Hgb urine dipstick TRACE (*)    Protein, ur 100 (*)    Urobilinogen, UA 4.0 (*)    Nitrite POSITIVE (*)    Leukocytes, UA LARGE (*)    All other components within normal limits  URINE CULTURE    Imaging Review No results found.   MDM   1. UTI (lower urinary tract infection)    Urine culture Lots of fluids Keflex qid     Janne Napoleon, NP 04/30/14 1336

## 2014-04-30 NOTE — Discharge Instructions (Signed)
Urinary Tract Infection  Urinary tract infections (UTIs) can develop anywhere along your urinary tract. Your urinary tract is your body's drainage system for removing wastes and extra water. Your urinary tract includes two kidneys, two ureters, a bladder, and a urethra. Your kidneys are a pair of bean-shaped organs. Each kidney is about the size of your fist. They are located below your ribs, one on each side of your spine.  CAUSES  Infections are caused by microbes, which are microscopic organisms, including fungi, viruses, and bacteria. These organisms are so small that they can only be seen through a microscope. Bacteria are the microbes that most commonly cause UTIs.  SYMPTOMS   Symptoms of UTIs may vary by age and gender of the patient and by the location of the infection. Symptoms in young women typically include a frequent and intense urge to urinate and a painful, burning feeling in the bladder or urethra during urination. Older women and men are more likely to be tired, shaky, and weak and have muscle aches and abdominal pain. A fever may mean the infection is in your kidneys. Other symptoms of a kidney infection include pain in your back or sides below the ribs, nausea, and vomiting.  DIAGNOSIS  To diagnose a UTI, your caregiver will ask you about your symptoms. Your caregiver also will ask to provide a urine sample. The urine sample will be tested for bacteria and white blood cells. White blood cells are made by your body to help fight infection.  TREATMENT   Typically, UTIs can be treated with medication. Because most UTIs are caused by a bacterial infection, they usually can be treated with the use of antibiotics. The choice of antibiotic and length of treatment depend on your symptoms and the type of bacteria causing your infection.  HOME CARE INSTRUCTIONS   If you were prescribed antibiotics, take them exactly as your caregiver instructs you. Finish the medication even if you feel better after you  have only taken some of the medication.   Drink enough water and fluids to keep your urine clear or pale yellow.   Avoid caffeine, tea, and carbonated beverages. They tend to irritate your bladder.   Empty your bladder often. Avoid holding urine for long periods of time.   Empty your bladder before and after sexual intercourse.   After a bowel movement, women should cleanse from front to back. Use each tissue only once.  SEEK MEDICAL CARE IF:    You have back pain.   You develop a fever.   Your symptoms do not begin to resolve within 3 days.  SEEK IMMEDIATE MEDICAL CARE IF:    You have severe back pain or lower abdominal pain.   You develop chills.   You have nausea or vomiting.   You have continued burning or discomfort with urination.  MAKE SURE YOU:    Understand these instructions.   Will watch your condition.   Will get help right away if you are not doing well or get worse.  Document Released: 09/25/2005 Document Revised: 06/16/2012 Document Reviewed: 01/24/2012  [employer] Patient Information 2014 [employer]

## 2014-04-30 NOTE — ED Notes (Signed)
Burning with urination, nausea, onset of symptoms (4/30)

## 2014-05-02 LAB — URINE CULTURE

## 2014-05-02 NOTE — ED Provider Notes (Signed)
Medical screening examination/treatment/procedure(s) were performed by non-physician practitioner and as supervising physician I was immediately available for consultation/collaboration.  Philipp Deputy, M.D.  Harden Mo, MD 05/02/14 [phone]

## 2014-05-02 NOTE — ED Notes (Signed)
Urine culture: >100,000 colonies E. Coli.  Pt. adequately treated with Keflex. Hanley Seamen Lynnda Wiersma 05/02/2014

## 2014-05-16 ENCOUNTER — Emergency Department (INDEPENDENT_AMBULATORY_CARE_PROVIDER_SITE_OTHER)
Admission: EM | Admit: 2014-05-16 | Discharge: 2014-05-16 | Disposition: A | Discharge status: Home / Self Care | Payer: PRIVATE HEALTH INSURANCE | Source: Home / Self Care | Attending: Emergency Medicine | Admitting: Emergency Medicine

## 2014-05-16 ENCOUNTER — Encounter (HOSPITAL_COMMUNITY): Payer: Self-pay | Admitting: Emergency Medicine

## 2014-05-16 DIAGNOSIS — K5900 Constipation, unspecified: Secondary | ICD-10-CM

## 2014-05-16 LAB — OCCULT BLOOD, POC DEVICE: FECAL OCCULT BLD: NEGATIVE

## 2014-05-16 MED ORDER — POLYETHYLENE GLYCOL 3350 17 GM/SCOOP PO POWD: 17.0000 g | Freq: Every day | ORAL | Status: DC

## 2014-05-16 MED ORDER — PROMETHAZINE HCL 25 MG PO TABS: 25.0000 mg | ORAL_TABLET | Freq: Four times a day (QID) | ORAL | Status: DC | PRN

## 2014-05-16 NOTE — ED Provider Notes (Signed)
  Chief Complaint   Chief Complaint  Patient presents with  . Constipation    History of Present Illness   Kim Hardy is an 80 year old female who's had problems with chronic constipation. She states she has not had a bowel movement 5 days. She's had abdominal pain off and on for years. This is been no different than usual. She tried a suppository without any improvement. She's not had any hematochezia or blood parenchyma. She's felt nauseated but has not vomited. She denies any anorexia or weight loss. She's never had a colonoscopy. She states she had temperature of 99.9 at home, but her temperature today here is 98.4. She was here 2 weeks ago for urinary tract infection. She thinks this is what might have caused her constipation.  Review of Systems   Other than as noted above, the patient denies any of the following symptoms: Constitutional:  No fever, chills, weight loss or anorexia. Abdomen:  No nausea, vomiting, hematememesis, melena, diarrhea, or hematochezia. GU:  No dysuria, frequency, urgency, or hematuria. Gyn:  No vaginal discharge, itching, abnormal bleeding, dyspareunia, or pelvic pain.  [place]   Past medical history, family history, social history, meds, and allergies were reviewed.   Physical Exam     Vital signs:  BP 136/69  Pulse 102  Temp(Src) 98.4 F (36.9 C) (Oral)  Resp 16  SpO2 100% Gen:  Alert, oriented, in no distress. Lungs:  Breath sounds clear and equal bilaterally.  No wheezes, rales or rhonchi. Heart:  Regular rhythm.  No gallops or murmers.   Abdomen:  Soft, flat, nondistended. No organomegaly or mass. Bowel sounds were hyperactive. She has slight tenderness to palpation in the left lower quadrant. Rectal exam: Sphincter tone was normal. She had a moderate amount of soft stool in the rectal ampulla. Skin:  Clear, warm and dry.  No rash.  Labs   Results for orders placed during the hospital encounter of 05/16/14  OCCULT BLOOD, POC DEVICE   Result Value Ref Range   Fecal Occult Bld NEGATIVE  NEGATIVE   Assessment   The encounter diagnosis was Constipation.  Plan     1.  Meds:  The following meds were prescribed:   Discharge Medication List as of 05/16/2014  9:33 AM    START taking these medications   Details  polyethylene glycol powder (MIRALAX) powder Take 17 g by mouth daily., Starting 05/16/2014, Until Discontinued, Normal    promethazine (PHENERGAN) 25 MG tablet Take 1 tablet (25 mg total) by mouth every 6 (six) hours as needed for nausea or vomiting., Starting 05/16/2014, Until Discontinued, Normal        2.  Patient Education/Counseling:  The patient was given appropriate handouts, self care instructions, and instructed in symptomatic relief.  Suggested a fleets enema if no results in 2 days. Also can take Senokot-S one to 2 tablets twice a day. Suggested increasing fiber, fluid, and exercise.  3.  Follow up:  The patient was told to follow up here if no better in 3 to 4 days, or sooner if becoming worse in any way, and given some red flag symptoms such as worsening pain, fever, vomiting, or evidence of GI bleeding which would prompt immediate return.  Follow up here as necessary.    Harden Mo, MD 05/16/14 2217

## 2014-05-16 NOTE — Discharge Instructions (Signed)
If no bowel movement in 2 days, try a Fleet's enema or Sennakot S 1 to 2 tabs twice daily or both.  Try to get 30 gm of fiber daily, 8 glasses of water a day, and 30 minutes of exercise daily.

## 2014-05-16 NOTE — ED Notes (Signed)
C/o constipation  With nausea x 5 days.  No relief with suppositories.  Denies any other symptoms.

## 2014-06-14 ENCOUNTER — Telehealth: Payer: Self-pay | Admitting: *Deleted

## 2014-06-14 NOTE — Telephone Encounter (Signed)
CALLED PATIENT TO ASK QUESTION, LVM FOR A RETURN CALL 

## 2014-06-28 ENCOUNTER — Ambulatory Visit
Admission: RE | Admit: 2014-06-28 | Discharge: 2014-06-28 | Disposition: A | Discharge status: Home / Self Care | Payer: PRIVATE HEALTH INSURANCE | Source: Ambulatory Visit | Attending: Radiation Oncology | Admitting: Radiation Oncology

## 2014-06-28 ENCOUNTER — Ambulatory Visit (HOSPITAL_COMMUNITY)
Admission: RE | Admit: 2014-06-28 | Discharge: 2014-06-28 | Disposition: A | Discharge status: Home / Self Care | Payer: PRIVATE HEALTH INSURANCE | Source: Ambulatory Visit | Attending: Radiation Oncology | Admitting: Radiation Oncology

## 2014-06-28 DIAGNOSIS — Z8521 Personal history of malignant neoplasm of larynx: Secondary | ICD-10-CM | POA: Insufficient documentation

## 2014-06-28 DIAGNOSIS — Z930 Tracheostomy status: Secondary | ICD-10-CM | POA: Insufficient documentation

## 2014-06-28 DIAGNOSIS — R091 Pleurisy: Secondary | ICD-10-CM | POA: Diagnosis not present

## 2014-06-28 DIAGNOSIS — C3430 Malignant neoplasm of lower lobe, unspecified bronchus or lung: Secondary | ICD-10-CM | POA: Insufficient documentation

## 2014-06-28 DIAGNOSIS — C3490 Malignant neoplasm of unspecified part of unspecified bronchus or lung: Secondary | ICD-10-CM | POA: Diagnosis present

## 2014-06-28 LAB — BUN AND CREATININE (CC13)
BUN: 17.7 mg/dL (ref 7.0–26.0)
Creatinine: 0.8 mg/dL (ref 0.6–1.1)

## 2014-06-28 MED ORDER — IOHEXOL 300 MG/ML  SOLN
100.0000 mL | Freq: Once | INTRAMUSCULAR | Status: AC | PRN
  Administered 2014-06-28: 80 mL via INTRAVENOUS

## 2014-07-06 ENCOUNTER — Telehealth: Payer: Self-pay | Admitting: *Deleted

## 2014-07-06 ENCOUNTER — Ambulatory Visit
Admission: RE | Admit: 2014-07-06 | Discharge: 2014-07-06 | Disposition: A | Discharge status: Home / Self Care | Payer: PRIVATE HEALTH INSURANCE | Source: Ambulatory Visit | Attending: Radiation Oncology | Admitting: Radiation Oncology

## 2014-07-06 ENCOUNTER — Encounter: Payer: Self-pay | Admitting: Radiation Oncology

## 2014-07-06 VITALS — BP 144/53 | HR 90 | Temp 98.5°F | Resp 20 | Ht 61.0 in | Wt 103.2 lb

## 2014-07-06 DIAGNOSIS — C3430 Malignant neoplasm of lower lobe, unspecified bronchus or lung: Secondary | ICD-10-CM

## 2014-07-06 NOTE — Telephone Encounter (Signed)
Called patient to inform of lab, test and fu visit, lvm for a return call

## 2014-07-06 NOTE — Progress Notes (Signed)
Follow up s/p rad tx RLL lung 04/01/11-04/10/11, no c/o pain, nausea, no difficulty swallowing or chewing, appetite good, staying hydrated,, CT Chest 05/3014 results in here to get results,energy level good 1:30 PM

## 2014-07-06 NOTE — Progress Notes (Signed)
Radiation Oncology         [phone] ________________________________  Name: Kim Hardy MRN: [id number]  Date: 07/06/2014  DOB: 11/19/1931  Follow-Up Visit Note  CC: Philis Fendt, MD  Florina Ou, MD  Diagnosis:   Stage I non-small cell lung cancer  Interval Since Last Radiation:  The patient completed radiation treatment in April of 2012   Narrative:  The patient returns today for routine follow-up.  She states she has done well. No shortness of breath. No chest discomfort. No difficulty swallowing. No distant pain. The patient really has no significant complaints today and overall is doing well with her health.                              ALLERGIES:  is allergic to anesthetics, amide; boniva; statins; and sulfa antibiotics.  Meds: Current Outpatient Prescriptions  Medication Sig Dispense Refill  . fenofibrate 160 MG tablet Take 160 mg by mouth.      Marland Kitchen OVER THE COUNTER MEDICATION Place 1 drop into both eyes 2 (two) times daily. For dry eyes  OTC Eye Drops       No current facility-administered medications for this encounter.    Physical Findings: The patient is in no acute distress. Patient is alert and oriented.  height is 5\' 1"  (1.549 m) and weight is 103 lb 3.2 oz (46.811 kg). Her oral temperature is 98.5 F (36.9 C). Her blood pressure is 144/53 and her pulse is 90. Her respiration is 20 and oxygen saturation is 98%. .   General: Well-developed, in no acute distress HEENT: Normocephalic, atraumatic; tracheostomy site looks good Cardiovascular: Regular rate and rhythm Respiratory: Clear to auscultation bilaterally GI: Soft, nontender, normal bowel sounds Extremities: No edema present   Lab Findings: Lab Results  Component Value Date   WBC 8.6 02/19/2011   HGB 13.9 09/08/2012   HCT 41.0 09/08/2012   MCV 93.4 02/19/2011   PLT 369 02/19/2011     Radiographic Findings: Ct Chest W Contrast  06/28/2014   CLINICAL DATA:  Right lung cancer, post radiotherapy.  Remote history of laryngeal cancer post tracheostomy and radiation.  EXAM: CT CHEST WITH CONTRAST  TECHNIQUE: Multidetector CT imaging of the chest was performed during intravenous contrast administration.  CONTRAST:  37mL OMNIPAQUE IOHEXOL 300 MG/ML  SOLN  COMPARISON:  12/16/2013  FINDINGS: Bandlike/ovoid opacity again noted in the right lower lobe measuring approximately 4.1 x 2.8 cm compared with 3.4 x 2.4 cm previously. Visually, this appears similar and is somewhat difficult to measure due to its ill-defined appearance. Slight overlying pleural thickening is stable.  Suspected areas of necrosis within the mass, stable.  Small scattered mediastinal lymph nodes, none pathologically enlarged or changed. Right paratracheal lymph node has a short axis diameter of 7 mm on image 17, stable. Heart is normal size. Aorta is normal caliber. Scattered coronary artery and aortic calcifications. No axillary or hilar adenopathy. No pleural effusions. No new pulmonary nodules.  Changes of tracheostomy again noted, stable. Chest wall soft tissues are unremarkable. Imaging into the upper abdomen shows no acute findings.  No acute bony abnormality or focal bone lesion.  IMPRESSION: 4.1 x 2.8 cm bandlike opacity in the posterior right lower lobe. Visually, this appears similar to prior study. However, this measures slightly larger which I suspect is related to the ill-defined nature of the opacity. Further evaluation with PET CT could be performed to assess for metabolic activity given  the slight apparent enlargement since prior study.   Electronically Signed   By: Rolm Baptise M.D.   On: 06/28/2014 12:02    Impression:    The patient clinically is doing well. Her CT scan looked similar to her prior scan without any clear evidence of recurrence. However, the area of interest in the right lower lobe was slightly larger. We discussed therefore having her repeat a CT scan of the chest sooner than what we have been doing at 6  months.  Plan:  The patient will undergo a CT scan of the chest in 4 months and then return to clinic.   Jodelle Gross, M.D., Ph.D.

## 2014-09-28 ENCOUNTER — Emergency Department (INDEPENDENT_AMBULATORY_CARE_PROVIDER_SITE_OTHER)
Admission: EM | Admit: 2014-09-28 | Discharge: 2014-09-28 | Disposition: A | Discharge status: Home / Self Care | Payer: PRIVATE HEALTH INSURANCE | Source: Home / Self Care | Attending: Emergency Medicine | Admitting: Emergency Medicine

## 2014-09-28 ENCOUNTER — Encounter (HOSPITAL_COMMUNITY): Payer: Self-pay | Admitting: Emergency Medicine

## 2014-09-28 DIAGNOSIS — N390 Urinary tract infection, site not specified: Secondary | ICD-10-CM

## 2014-09-28 LAB — POCT URINALYSIS DIP (DEVICE)
Bilirubin Urine: NEGATIVE
Glucose, UA: 100 mg/dL — AB
KETONES UR: NEGATIVE mg/dL
NITRITE: POSITIVE — AB
PH: 7 (ref 5.0–8.0)
PROTEIN: 100 mg/dL — AB
Specific Gravity, Urine: 1.015 (ref 1.005–1.030)
UROBILINOGEN UA: 0.2 mg/dL (ref 0.0–1.0)

## 2014-09-28 MED ORDER — NITROFURANTOIN MONOHYD MACRO 100 MG PO CAPS: 100.0000 mg | ORAL_CAPSULE | Freq: Two times a day (BID) | ORAL | Status: DC

## 2014-09-28 NOTE — Discharge Instructions (Signed)

## 2014-09-28 NOTE — ED Provider Notes (Signed)
Medical screening examination/treatment/procedure(s) were performed by non-physician practitioner and as supervising physician I was immediately available for consultation/collaboration.  Philipp Deputy, M.D.  Harden Mo, MD 09/28/14 2157

## 2014-09-28 NOTE — ED Notes (Signed)
Pt states that she is having pain trying to urinate, pt states urgency and pain when having to use restroom. Pt denies pain any pain at this time. Pt in no acute distress.

## 2014-09-28 NOTE — ED Provider Notes (Signed)
CSN: [id number]     Arrival date & time 09/28/14  1352 History   First MD Initiated Contact with Patient 09/28/14 1426     Chief Complaint  Patient presents with  . Urinary Tract Infection   (Consider location/radiation/quality/duration/timing/severity/associated sxs/prior Treatment) HPI Comments: 24 hours of dysuria, urinary frequency, and only urinating in small amounts with mild nausea, anorexia and lower back discomfort. Denies fever/chills, vomiting, diarrhea, flank or abdominal pain.  PCP: Dr. Luretha Rued  Patient is a 80 y.o. female presenting with urinary tract infection. The history is provided by the patient.  Urinary Tract Infection This is a new problem. The current episode started yesterday. The problem occurs constantly. The problem has been gradually worsening.    Past Medical History  Diagnosis Date  . Hypercholesterolemia   . History of radiation therapy 04/01/2011- 4/11/212    right lung  lower lobe  . Allergy     sulfa-hives, boniva=tremors,statins=n/v  . 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 Lung cancer dx'd 01-2011    xrt comp 03/2011   Past Surgical History  Procedure Laterality Date  . Laryngectomy  1997  . Voice prosthesis     History reviewed. No pertinent family history. History  Substance Use Topics  . Smoking status: Former Smoker -- 1.00 packs/day for 20 years    Quit date: 03/30/1996  . Smokeless tobacco: Not on file     Comment: quit w/dx laryngeal cancer 1997  . Alcohol Use: No   OB History   Grav Para Term Preterm Abortions TAB SAB Ect Mult Living                 Review of Systems  All other systems reviewed and are negative.   Allergies  Anesthetics, amide; Boniva; Statins; and Sulfa antibiotics  Home Medications   Prior to Admission medications   Medication Sig Start Date End Date Taking? Authorizing Provider  fenofibrate 160 MG tablet Take 160 mg by mouth.   Yes Historical Provider, MD  OVER THE COUNTER MEDICATION Place 1 drop  into both eyes 2 (two) times daily. For dry eyes  OTC Eye Drops   Yes Historical Provider, MD  nitrofurantoin, macrocrystal-monohydrate, (MACROBID) 100 MG capsule Take 1 capsule (100 mg total) by mouth 2 (two) times daily. X 7 days 09/28/14   Annett Gula H Octaviano Mukai, PA   BP 118/70  Pulse 108  Temp(Src) 99 F (37.2 C) (Oral)  Resp 16  SpO2 96% Physical Exam  Nursing note and vitals reviewed. Constitutional: She is oriented to person, place, and time. She appears well-developed and well-nourished. No distress.  HENT:  Head: Normocephalic and atraumatic.  Eyes: Conjunctivae are normal. No scleral icterus.  Cardiovascular: Normal rate, regular rhythm and normal heart sounds.   Pulmonary/Chest: Effort normal and breath sounds normal.  Abdominal: Soft. Normal appearance and bowel sounds are normal. She exhibits no distension. There is no tenderness. There is no CVA tenderness.  Musculoskeletal: Normal range of motion.  Neurological: She is alert and oriented to person, place, and time.  Skin: Skin is warm and dry. No rash noted. No erythema.  Psychiatric: She has a normal mood and affect. Her behavior is normal.    ED Course  Procedures (including critical care time) Labs Review Labs Reviewed  POCT URINALYSIS DIP (DEVICE) - Abnormal; Notable for the following:    Glucose, UA 100 (*)    Hgb urine dipstick MODERATE (*)    Protein, ur 100 (*)  Nitrite POSITIVE (*)    Leukocytes, UA LARGE (*)    All other components within normal limits  URINE CULTURE    Imaging Review No results found.   MDM   1. UTI (lower urinary tract infection)    UA consistent with UTI. Will send specimen for C&S and initiate treatment with Macrobid. PCP follow up if no improvement.     Lutricia Feil, [place] 09/28/14 1505

## 2014-10-01 LAB — URINE CULTURE
Colony Count: >=100000
Special Requests: NORMAL

## 2014-10-02 ENCOUNTER — Telehealth (HOSPITAL_COMMUNITY): Payer: Self-pay | Admitting: *Deleted

## 2014-10-02 NOTE — ED Notes (Signed)
PA said the Macrobid was intermediate on the sens. and to call pt. for improvement.  I called pt. and she said no symptoms. I asked what symptoms she had when she was here. She said it felt like she had to urinate and couldn't, but she is urinating good now.  I told her I would call back.  Discussed with PA.  She said to finish the Macrobid. increase her fluid intake and to call back if any problems.  Pt. notified and voiced understanding. Roselyn Meier 10/02/2014

## 2014-10-26 ENCOUNTER — Other Ambulatory Visit: Payer: Self-pay | Admitting: Radiation Oncology

## 2014-10-26 DIAGNOSIS — C3430 Malignant neoplasm of lower lobe, unspecified bronchus or lung: Secondary | ICD-10-CM

## 2014-11-07 ENCOUNTER — Ambulatory Visit (HOSPITAL_COMMUNITY): Payer: PRIVATE HEALTH INSURANCE

## 2014-11-07 ENCOUNTER — Ambulatory Visit: Payer: PRIVATE HEALTH INSURANCE

## 2014-11-07 ENCOUNTER — Ambulatory Visit (HOSPITAL_COMMUNITY)
Admission: RE | Admit: 2014-11-07 | Discharge: 2014-11-07 | Disposition: A | Discharge status: Home / Self Care | Payer: PRIVATE HEALTH INSURANCE | Source: Ambulatory Visit | Attending: Radiation Oncology | Admitting: Radiation Oncology

## 2014-11-07 ENCOUNTER — Ambulatory Visit
Admission: RE | Admit: 2014-11-07 | Discharge: 2014-11-07 | Disposition: A | Discharge status: Home / Self Care | Payer: PRIVATE HEALTH INSURANCE | Source: Ambulatory Visit | Attending: Radiation Oncology | Admitting: Radiation Oncology

## 2014-11-07 ENCOUNTER — Encounter (HOSPITAL_COMMUNITY): Payer: Self-pay

## 2014-11-07 DIAGNOSIS — I700 Atherosclerosis of aorta: Secondary | ICD-10-CM | POA: Diagnosis not present

## 2014-11-07 DIAGNOSIS — C3491 Malignant neoplasm of unspecified part of right bronchus or lung: Secondary | ICD-10-CM | POA: Diagnosis present

## 2014-11-07 DIAGNOSIS — C329 Malignant neoplasm of larynx, unspecified: Secondary | ICD-10-CM | POA: Diagnosis present

## 2014-11-07 DIAGNOSIS — C3430 Malignant neoplasm of lower lobe, unspecified bronchus or lung: Secondary | ICD-10-CM | POA: Diagnosis present

## 2014-11-07 DIAGNOSIS — Z930 Tracheostomy status: Secondary | ICD-10-CM | POA: Insufficient documentation

## 2014-11-07 DIAGNOSIS — I2510 Atherosclerotic heart disease of native coronary artery without angina pectoris: Secondary | ICD-10-CM | POA: Diagnosis not present

## 2014-11-07 LAB — BUN AND CREATININE (CC13)
BUN: 24.3 mg/dL (ref 7.0–26.0)
Creatinine: 0.8 mg/dL (ref 0.6–1.1)

## 2014-11-07 MED ORDER — IOHEXOL 300 MG/ML  SOLN
80.0000 mL | Freq: Once | INTRAMUSCULAR | Status: AC | PRN
  Administered 2014-11-07: 80 mL via INTRAVENOUS

## 2014-11-09 ENCOUNTER — Ambulatory Visit
Admission: RE | Admit: 2014-11-09 | Payer: PRIVATE HEALTH INSURANCE | Source: Ambulatory Visit | Admitting: Radiation Oncology

## 2014-11-10 ENCOUNTER — Ambulatory Visit
Admission: RE | Admit: 2014-11-10 | Discharge: 2014-11-10 | Disposition: A | Discharge status: Home / Self Care | Payer: PRIVATE HEALTH INSURANCE | Source: Ambulatory Visit | Attending: Radiation Oncology | Admitting: Radiation Oncology

## 2014-11-10 ENCOUNTER — Encounter: Payer: Self-pay | Admitting: Radiation Oncology

## 2014-11-10 DIAGNOSIS — C3490 Malignant neoplasm of unspecified part of unspecified bronchus or lung: Secondary | ICD-10-CM

## 2014-11-10 NOTE — Progress Notes (Signed)
Radiation Oncology         [phone]) [phone] ________________________________  Name: Kim Hardy MRN: [id number]  Date: 11/10/2014  DOB: 06-Aug-1931  Follow-Up Visit Note  CC: Philis Fendt, MD  Florina Ou, MD  Diagnosis:   Non-small cell lung cancer status post stereotactic body radiation treatment in April 2012  Narrative: The patient returns today for routine follow-up.  The patient states she is doing well overall. No difficulties with shortness of breath. No chest pain. She still states that she has good energy. She is followed at [facility] regarding her tracheostomy site. The patient had a recent CT scan of the chest and comes in today to discuss this result.                              ALLERGIES:  is allergic to anesthetics, amide; boniva; statins; and sulfa antibiotics.  Meds: Current Outpatient Prescriptions  Medication Sig Dispense Refill  . fenofibrate 160 MG tablet Take 160 mg by mouth.    . nitrofurantoin, macrocrystal-monohydrate, (MACROBID) 100 MG capsule Take 1 capsule (100 mg total) by mouth 2 (two) times daily. X 7 days 14 capsule 0  . OVER THE COUNTER MEDICATION Place 1 drop into both eyes 2 (two) times daily. For dry eyes  OTC Eye Drops     No current facility-administered medications for this encounter.    Physical Findings: The patient is in no acute distress. Patient is alert and oriented.  height is 5\' 1"  (1.549 m). Her oral temperature is 98.3 F (36.8 C). Her blood pressure is 89/57 and her pulse is 83. Her respiration is 16 and oxygen saturation is 100%. .   General: Well-developed, in no acute distress HEENT: Normocephalic, atraumatic Cardiovascular: Regular rate and rhythm Respiratory: Clear to auscultation bilaterally GI: Soft, nontender, normal bowel sounds Extremities: No edema present   Lab Findings: Lab Results  Component Value Date   WBC 8.6 02/19/2011   HGB 13.9 09/08/2012   HCT 41.0 09/08/2012   MCV 93.4 02/19/2011   PLT 369  02/19/2011     Radiographic Findings: Ct Chest W Contrast  11/07/2014   CLINICAL DATA:  Right lung cancer diagnosed 2012. Laryngeal cancer 1997.  EXAM: CT CHEST WITH CONTRAST  TECHNIQUE: Multidetector CT imaging of the chest was performed during intravenous contrast administration.  CONTRAST:  70mL OMNIPAQUE IOHEXOL 300 MG/ML  SOLN  COMPARISON:  06/28/2014  FINDINGS: Ill-defined masslike opacity again noted posteriorly in the right lower lobe. This measures 4.2 x 3.2 cm. When measured at the same levels and in the same planes previously, this is stable. Low-density areas again noted within the mass suspicious for necrosis. Overall appearance is unchanged.  Mildly prominent right hilar lymph node has a short axis diameter of 9 mm, stable. Small scattered mediastinal lymph nodes. Pretracheal lymph node has a short axis diameter of 6 mm compared with 7 mm previously. No new or enlarging lymph nodes. No axillary adenopathy.  Heart is normal size. Aorta is normal caliber. Moderate to advanced calcifications within the coronary arteries and aortic arch. Chest wall soft tissues are unremarkable. Changes of tracheostomy again noted.  Imaging into the upper abdomen shows no acute findings.  No acute bony abnormality.  IMPRESSION: Ill-defined mass in the posterior right lower lobe again noted, stable since prior study. Stable borderline size right hilar lymph node and small mediastinal lymph nodes. No progressive disease.   Electronically Signed   By:  Rolm Baptise M.D.   On: 11/07/2014 09:19    Impression:    The patient clinically is doing well. Her recent CT scan of the chest did not show any evidence of progressive disease.  Plan:  CT scan of the chest in 6 months, then follow-up appointment  I spent 10 minutes with the patient today, the majority of which was spent counseling the patient on the diagnosis of cancer and coordinating care.   Jodelle Gross, M.D., Ph.D.

## 2014-11-10 NOTE — Progress Notes (Signed)
Follow up s/p rad txs RLL lung 04/01/11-04/10/11, patient withvoice box, whispers, , no nausea, no difficulty swallowing, no sob, 100% room air, no coughing, energy level good, no fatigue, last CT CHEST 11/07/14 low b/p 89/57 4:27 PM

## 2014-11-11 ENCOUNTER — Telehealth: Payer: Self-pay | Admitting: Radiation Oncology

## 2014-11-11 NOTE — Telephone Encounter (Signed)
Confirmed with patient her lab appt on 05/11/15 @ 2:00p, then CT at 2:45p. Also, a f/u w/Dr. [name] at 1:30 on 05/17/15. Pt. Agreeable.

## 2014-11-14 ENCOUNTER — Other Ambulatory Visit: Payer: Self-pay | Admitting: Radiation Oncology

## 2014-11-14 DIAGNOSIS — C3430 Malignant neoplasm of lower lobe, unspecified bronchus or lung: Secondary | ICD-10-CM

## 2014-11-27 ENCOUNTER — Encounter (HOSPITAL_COMMUNITY): Payer: Self-pay | Admitting: *Deleted

## 2014-11-27 ENCOUNTER — Emergency Department (INDEPENDENT_AMBULATORY_CARE_PROVIDER_SITE_OTHER)
Admission: EM | Admit: 2014-11-27 | Discharge: 2014-11-27 | Disposition: A | Discharge status: Home / Self Care | Payer: PRIVATE HEALTH INSURANCE | Source: Home / Self Care | Attending: Family Medicine | Admitting: Family Medicine

## 2014-11-27 DIAGNOSIS — N390 Urinary tract infection, site not specified: Secondary | ICD-10-CM

## 2014-11-27 LAB — POCT URINALYSIS DIP (DEVICE)
Bilirubin Urine: NEGATIVE
Glucose, UA: NEGATIVE mg/dL
Ketones, ur: NEGATIVE mg/dL
Nitrite: NEGATIVE
PH: 5.5 (ref 5.0–8.0)
PROTEIN: 100 mg/dL — AB
Specific Gravity, Urine: 1.025 (ref 1.005–1.030)
Urobilinogen, UA: 0.2 mg/dL (ref 0.0–1.0)

## 2014-11-27 MED ORDER — CEPHALEXIN 500 MG PO CAPS: 500.0000 mg | ORAL_CAPSULE | Freq: Four times a day (QID) | ORAL | Status: DC

## 2014-11-27 NOTE — ED Provider Notes (Signed)
CSN: [id number]     Arrival date & time 11/27/14  6789 History   First MD Initiated Contact with Patient 11/27/14 1034     Chief Complaint  Patient presents with  . Urinary Tract Infection   (Consider location/radiation/quality/duration/timing/severity/associated sxs/prior Treatment) HPI Comments: Pt reports getting frequent uti's this year. Thinks is related to not drinking enough water.   Patient is a 80 y.o. female presenting with urinary tract infection. The history is provided by the patient.  Urinary Tract Infection This is a new problem. The current episode started yesterday. The problem occurs constantly. The problem has not changed since onset.Pertinent negatives include no abdominal pain. Exacerbated by: urinating. Nothing relieves the symptoms. She has tried nothing for the symptoms.    Past Medical History  Diagnosis Date  . Hypercholesterolemia   . History of radiation therapy 04/01/2011- 4/11/212    right lung  lower lobe  . Allergy     sulfa-hives, boniva=tremors,statins=n/v  . 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 Lung cancer dx'd 01-2011    xrt comp 03/2011   Past Surgical History  Procedure Laterality Date  . Laryngectomy  1997  . Voice prosthesis    . Tracheostomy     History reviewed. No pertinent family history. History  Substance Use Topics  . Smoking status: Former Smoker -- 1.00 packs/day for 20 years    Quit date: 03/30/1996  . Smokeless tobacco: Not on file     Comment: quit w/dx laryngeal cancer 1997  . Alcohol Use: No   OB History    No data available     Review of Systems  Constitutional: Negative for fever and chills.  Gastrointestinal: Negative for nausea, vomiting and abdominal pain.  Genitourinary: Positive for dysuria. Negative for flank pain.    Allergies  Anesthetics, amide; Boniva; Ciprofloxacin; Statins; and Sulfa antibiotics  Home Medications   Prior to Admission medications   Medication Sig Start Date End Date Taking?  Authorizing Provider  fenofibrate 160 MG tablet Take 160 mg by mouth.   Yes Historical Provider, MD  OVER THE COUNTER MEDICATION Place 1 drop into both eyes 2 (two) times daily. For dry eyes  OTC Eye Drops   Yes Historical Provider, MD  cephALEXin (KEFLEX) 500 MG capsule Take 1 capsule (500 mg total) by mouth 4 (four) times daily. 11/27/14   Carvel Getting, NP   BP 164/72 mmHg  Pulse 102  Temp(Src) 98 F (36.7 C) (Oral)  Resp 18  SpO2 99% Physical Exam  Constitutional: She appears well-developed and well-nourished. No distress.  Cardiovascular: Normal rate and regular rhythm.   Pulmonary/Chest: Effort normal and breath sounds normal.  Abdominal: Normal appearance and bowel sounds are normal. There is tenderness in the left upper quadrant. There is no rigidity, no rebound, no guarding and no CVA tenderness.    ED Course  Procedures (including critical care time) Labs Review Labs Reviewed  POCT URINALYSIS DIP (DEVICE) - Abnormal; Notable for the following:    Hgb urine dipstick MODERATE (*)    Protein, ur 100 (*)    Leukocytes, UA LARGE (*)    All other components within normal limits    Imaging Review No results found.   MDM   1. UTI (lower urinary tract infection)    rx keflex 500mg  QID #28. Referred to [facility] for frequent utis.     Carvel Getting, NP 11/27/14 1040

## 2014-11-27 NOTE — ED Notes (Signed)
C/O dysuria, polyuria, urinary urgency since last night.  Denies fevers, abd pain, flank or back pain.  States gets frequent UTIs - states Keflex usually works well.

## 2014-11-27 NOTE — Discharge Instructions (Signed)
Follow up with the urologist.    Urinary Tract Infection Urinary tract infections (UTIs) can develop anywhere along your urinary tract. Your urinary tract is your body's drainage system for removing wastes and extra water. Your urinary tract includes two kidneys, two ureters, a bladder, and a urethra. Your kidneys are a pair of bean-shaped organs. Each kidney is about the size of your fist. They are located below your ribs, one on each side of your spine. CAUSES Infections are caused by microbes, which are microscopic organisms, including fungi, viruses, and bacteria. These organisms are so small that they can only be seen through a microscope. Bacteria are the microbes that most commonly cause UTIs. SYMPTOMS  Symptoms of UTIs may vary by age and gender of the patient and by the location of the infection. Symptoms in young women typically include a frequent and intense urge to urinate and a painful, burning feeling in the bladder or urethra during urination. Older women and men are more likely to be tired, shaky, and weak and have muscle aches and abdominal pain. A fever may mean the infection is in your kidneys. Other symptoms of a kidney infection include pain in your back or sides below the ribs, nausea, and vomiting. DIAGNOSIS To diagnose a UTI, your caregiver will ask you about your symptoms. Your caregiver also will ask to provide a urine sample. The urine sample will be tested for bacteria and white blood cells. White blood cells are made by your body to help fight infection. TREATMENT  Typically, UTIs can be treated with medication. Because most UTIs are caused by a bacterial infection, they usually can be treated with the use of antibiotics. The choice of antibiotic and length of treatment depend on your symptoms and the type of bacteria causing your infection. HOME CARE INSTRUCTIONS  If you were prescribed antibiotics, take them exactly as your caregiver instructs you. Finish the medication  even if you feel better after you have only taken some of the medication.  Drink enough water and fluids to keep your urine clear or pale yellow.  Avoid caffeine, tea, and carbonated beverages. They tend to irritate your bladder.  Empty your bladder often. Avoid holding urine for long periods of time.  Empty your bladder before and after sexual intercourse.  After a bowel movement, women should cleanse from front to back. Use each tissue only once. SEEK MEDICAL CARE IF:   You have back pain.  You develop a fever.  Your symptoms do not begin to resolve within 3 days. SEEK IMMEDIATE MEDICAL CARE IF:   You have severe back pain or lower abdominal pain.  You develop chills.  You have nausea or vomiting.  You have continued burning or discomfort with urination. MAKE SURE YOU:   Understand these instructions.  Will watch your condition.  Will get help right away if you are not doing well or get worse. Document Released: 09/25/2005 Document Revised: 06/16/2012 Document Reviewed: 01/24/2012 [facility] Patient Information 2015 [place], [place]. This information is not intended to replace advice given to you by your health care provider. Make sure you discuss any questions you have with your health care provider.

## 2015-04-17 ENCOUNTER — Encounter (HOSPITAL_COMMUNITY): Payer: Self-pay | Admitting: Emergency Medicine

## 2015-04-17 ENCOUNTER — Emergency Department (HOSPITAL_COMMUNITY)
Admission: EM | Admit: 2015-04-17 | Discharge: 2015-04-17 | Disposition: A | Discharge status: Home / Self Care | Payer: Medicare Other | Attending: Emergency Medicine | Admitting: Emergency Medicine

## 2015-04-17 ENCOUNTER — Emergency Department (HOSPITAL_COMMUNITY): Payer: Medicare Other

## 2015-04-17 DIAGNOSIS — Z8639 Personal history of other endocrine, nutritional and metabolic disease: Secondary | ICD-10-CM | POA: Insufficient documentation

## 2015-04-17 DIAGNOSIS — R531 Weakness: Secondary | ICD-10-CM | POA: Diagnosis not present

## 2015-04-17 DIAGNOSIS — Z85118 Personal history of other malignant neoplasm of bronchus and lung: Secondary | ICD-10-CM | POA: Diagnosis not present

## 2015-04-17 DIAGNOSIS — Z79899 Other long term (current) drug therapy: Secondary | ICD-10-CM | POA: Diagnosis not present

## 2015-04-17 DIAGNOSIS — R110 Nausea: Secondary | ICD-10-CM | POA: Diagnosis not present

## 2015-04-17 DIAGNOSIS — R079 Chest pain, unspecified: Secondary | ICD-10-CM | POA: Diagnosis not present

## 2015-04-17 DIAGNOSIS — Z923 Personal history of irradiation: Secondary | ICD-10-CM | POA: Insufficient documentation

## 2015-04-17 DIAGNOSIS — Z792 Long term (current) use of antibiotics: Secondary | ICD-10-CM | POA: Insufficient documentation

## 2015-04-17 DIAGNOSIS — Z8521 Personal history of malignant neoplasm of larynx: Secondary | ICD-10-CM | POA: Diagnosis not present

## 2015-04-17 DIAGNOSIS — Z87891 Personal history of nicotine dependence: Secondary | ICD-10-CM | POA: Diagnosis not present

## 2015-04-17 DIAGNOSIS — R630 Anorexia: Secondary | ICD-10-CM | POA: Insufficient documentation

## 2015-04-17 DIAGNOSIS — R42 Dizziness and giddiness: Secondary | ICD-10-CM | POA: Insufficient documentation

## 2015-04-17 LAB — BASIC METABOLIC PANEL
Anion gap: 7 (ref 5–15)
BUN: 19 mg/dL (ref 6–23)
CHLORIDE: 107 mmol/L (ref 96–112)
CO2: 23 mmol/L (ref 19–32)
CREATININE: 0.8 mg/dL (ref 0.50–1.10)
Calcium: 8.6 mg/dL (ref 8.4–10.5)
GFR calc Af Amer: 78 mL/min — ABNORMAL LOW (ref >90)
GFR calc non Af Amer: 67 mL/min — ABNORMAL LOW (ref >90)
Glucose, Bld: 102 mg/dL — ABNORMAL HIGH (ref 70–99)
Potassium: 3.8 mmol/L (ref 3.5–5.1)
Sodium: 137 mmol/L (ref 135–145)

## 2015-04-17 LAB — CBC
HCT: 39.1 % (ref 36.0–46.0)
HEMOGLOBIN: 12.7 g/dL (ref 12.0–15.0)
MCH: 31.1 pg (ref 26.0–34.0)
MCHC: 32.5 g/dL (ref 30.0–36.0)
MCV: 95.6 fL (ref 78.0–100.0)
PLATELETS: 297 10*3/uL (ref 150–400)
RBC: 4.09 MIL/uL (ref 3.87–5.11)
RDW: 13.5 % (ref 11.5–15.5)
WBC: 10.3 10*3/uL (ref 4.0–10.5)

## 2015-04-17 MED ORDER — ONDANSETRON HCL 4 MG/2ML IJ SOLN: 4.0000 mg | Freq: Once | INTRAMUSCULAR | Status: DC

## 2015-04-17 MED ORDER — SODIUM CHLORIDE 0.9 % IV SOLN
1000.0000 mL | Freq: Once | INTRAVENOUS | Status: AC
  Administered 2015-04-17: 1000 mL via INTRAVENOUS

## 2015-04-17 MED ORDER — ONDANSETRON 4 MG PO TBDP: 4.0000 mg | ORAL_TABLET | Freq: Three times a day (TID) | ORAL | Status: DC | PRN

## 2015-04-17 MED ORDER — SODIUM CHLORIDE 0.9 % IV BOLUS (SEPSIS): 500.0000 mL | Freq: Once | INTRAVENOUS | Status: DC

## 2015-04-17 MED ORDER — ONDANSETRON HCL 4 MG/2ML IJ SOLN
4.0000 mg | Freq: Once | INTRAMUSCULAR | Status: AC
  Administered 2015-04-17: 4 mg via INTRAVENOUS
  Filled 2015-04-17: qty 2

## 2015-04-17 NOTE — ED Notes (Signed)
Patient transported to X-ray 

## 2015-04-17 NOTE — ED Provider Notes (Signed)
CSN: [id number]     Arrival date & time 04/17/15  1205 History   First MD Initiated Contact with Patient 04/17/15 1343     Chief Complaint  Patient presents with  . Nausea    for few weeks, no appetite, not eating  . Dizziness    intermittent "have to hold onto the walls", denies syncope  . Breast Pain    R lower breast pain, "cancer in my lung, feels like it's getting bigger"     (Consider location/radiation/quality/duration/timing/severity/associated sxs/prior Treatment) HPI Patient presents with concern of dizziness, anorexia, nausea. Currently the patient has minimal complaints, states that she feels as though her abdomen is bloated after eating, and her anorexia is increasing, but currently she is not eating, is at rest, has minimal complaint sprayed Patient has a notable history of right lung cancer, no recent radiation or chemotherapy. She also has a history of laryngectomy in the distant past No recent fever, chills, syncope. She occasionally has left-sided abdominal pain, but this has been present for 1 year, whereas her other symptoms are new over the past 3 days.  Past Medical History  Diagnosis Date  . Hypercholesterolemia   . History of radiation therapy 04/01/2011- 4/11/212    right lung  lower lobe  . Allergy     sulfa-hives, boniva=tremors,statins=n/v  . 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 Lung cancer dx'd 01-2011    xrt comp 03/2011   Past Surgical History  Procedure Laterality Date  . Laryngectomy  1997  . Voice prosthesis    . Tracheostomy     No family history on file. History  Substance Use Topics  . Smoking status: Former Smoker -- 1.00 packs/day for 20 years    Quit date: 03/30/1996  . Smokeless tobacco: Not on file     Comment: quit w/dx laryngeal cancer 1997  . Alcohol Use: No   OB History    No data available     Review of Systems  Constitutional:       Per HPI, otherwise negative  HENT:       Per HPI, otherwise negative    Respiratory:       Per HPI, otherwise negative  Cardiovascular:       Per HPI, otherwise negative  Gastrointestinal: Negative for vomiting.  Endocrine:       Negative aside from HPI  Genitourinary:       Neg aside from HPI   Musculoskeletal:       Per HPI, otherwise negative  Skin: Negative.   Allergic/Immunologic: Positive for immunocompromised state.  Neurological: Positive for dizziness and weakness. Negative for syncope.      Allergies  Anesthetics, amide; Boniva; Ciprofloxacin; Statins; and Sulfa antibiotics  Home Medications   Prior to Admission medications   Medication Sig Start Date End Date Taking? Authorizing Provider  fenofibrate 160 MG tablet Take 160 mg by mouth.   Yes Historical Provider, MD  OVER THE COUNTER MEDICATION Place 1 drop into both eyes 2 (two) times daily. For dry eyes  OTC Eye Drops   Yes Historical Provider, MD  cephALEXin (KEFLEX) 500 MG capsule Take 1 capsule (500 mg total) by mouth 4 (four) times daily. Patient not taking: Reported on 04/17/2015 11/27/14   Carvel Getting, NP   BP 146/54 mmHg  Pulse 85  Temp(Src) 98.4 F (36.9 C) (Oral)  Resp 16  Ht 5' (1.524 m)  Wt 103 lb (46.72 kg)  BMI 20.12 kg/m2  SpO2  97% Physical Exam  Constitutional: She is oriented to person, place, and time. She has a sickly appearance. No distress.  HENT:  Head: Normocephalic and atraumatic.    Eyes: Conjunctivae and EOM are normal.  Cardiovascular: Normal rate and regular rhythm.   Pulmonary/Chest: Effort normal and breath sounds normal. No stridor. No respiratory distress.  Abdominal: She exhibits no distension.    Musculoskeletal: She exhibits no edema.  Neurological: She is alert and oriented to person, place, and time. No cranial nerve deficit.  Skin: Skin is warm and dry.  Psychiatric: She has a normal mood and affect.  Nursing note and vitals reviewed.   ED Course  Procedures (including critical care time) Labs Review Labs Reviewed  BASIC  METABOLIC PANEL - Abnormal; Notable for the following:    Glucose, Bld 102 (*)    GFR calc non Af Amer 67 (*)    GFR calc Af Amer 78 (*)    All other components within normal limits  CBC    Imaging Review Dg Chest 2 View  04/17/2015   CLINICAL DATA:  Chest pain for 2 weeks on the right side, history of lung carcinoma  EXAM: CHEST  2 VIEW  COMPARISON:  None.  FINDINGS: Cardiac shadow is within normal limits. The left lung is clear. Right basilar infiltrate is noted. This is similar to that seen on prior CT examination from 11/07/2014. These changes are suspicious for underlying mass lesion. This would correspond with the patient's given clinical history. No new focal infiltrate is seen.  IMPRESSION: Right basilar density similar to that seen in November of 2015 consistent with the patient's given clinical history.   Electronically Signed   By: Inez Catalina M.D.   On: 04/17/2015 15:07     EKG Interpretation None     Pulse oximetry 100% room air normal Review of patient's chart demonstrates history of right lung malignancy, prior radiation therapy.  4:09 PM On repeat exam the patient sitting upright, in no distress, states that she feels better. We discussed all findings, including the reassuring labs, vital signs. We also discussed the possibility of radiation-induced abdominal wall pain, as it has been present for about one year. Patient continues to have no focal abdominal pain. Patient will follow up with her oncologist and primary care physician.  MDM  She presents with ongoing his illness, nausea, anorexia. Patient is awake, alert, with soft, non-peritoneal abdomen, unremarkable vital signs. Low evidence for acute infection, or other acute new pathology. Patient has no evidence for respiratory compromise. Patient improved here with fluid resuscitation, was discharged in stable condition to follow-up with her care team.     Carmin Muskrat, MD 04/17/15 [phone]

## 2015-04-17 NOTE — ED Notes (Addendum)
Pt A+Ox4, reports c/o R lung CA, receiving radiation, c/o R lower breast pain x2 weeks "feels like it's getting bigger".  Pt also reports c/o nausea, no appetite, not eating/drinking x2 weeks, pt also reports c/o intermittent dizziness, denies at this time.  Pt denies cp/palpitations or SOB.  Speaking full/clear sentences, rr even/un-lab.  Pt denies abd pain.  Denies v/d/c, fevers/chills, weakness. Neuros grossly intact. Skin PWD.  MAEI.  Ambulatory with steady gait.  NAD.

## 2015-04-17 NOTE — Discharge Instructions (Signed)
As discussed, today's evaluation is largely reassuring in terms of finding new acute pathology or dangerous items. However, it is important to follow-up with your primary care physician and your oncologist for continued evaluation of your new nausea and dizziness.  Return here for concerning changes in your condition.

## 2015-04-20 DIAGNOSIS — E784 Other hyperlipidemia: Secondary | ICD-10-CM | POA: Diagnosis not present

## 2015-04-20 DIAGNOSIS — K30 Functional dyspepsia: Secondary | ICD-10-CM | POA: Diagnosis not present

## 2015-04-20 DIAGNOSIS — R42 Dizziness and giddiness: Secondary | ICD-10-CM | POA: Diagnosis not present

## 2015-04-20 DIAGNOSIS — N390 Urinary tract infection, site not specified: Secondary | ICD-10-CM | POA: Diagnosis not present

## 2015-04-20 DIAGNOSIS — Z131 Encounter for screening for diabetes mellitus: Secondary | ICD-10-CM | POA: Diagnosis not present

## 2015-05-04 DIAGNOSIS — C3490 Malignant neoplasm of unspecified part of unspecified bronchus or lung: Secondary | ICD-10-CM | POA: Diagnosis not present

## 2015-05-04 DIAGNOSIS — K219 Gastro-esophageal reflux disease without esophagitis: Secondary | ICD-10-CM | POA: Diagnosis not present

## 2015-05-04 DIAGNOSIS — E784 Other hyperlipidemia: Secondary | ICD-10-CM | POA: Diagnosis not present

## 2015-05-11 ENCOUNTER — Ambulatory Visit
Admission: RE | Admit: 2015-05-11 | Discharge: 2015-05-11 | Disposition: A | Discharge status: Home / Self Care | Payer: Medicare Other | Source: Ambulatory Visit | Attending: Radiation Oncology | Admitting: Radiation Oncology

## 2015-05-11 ENCOUNTER — Encounter (HOSPITAL_COMMUNITY): Payer: Self-pay

## 2015-05-11 ENCOUNTER — Ambulatory Visit (HOSPITAL_COMMUNITY): Payer: PRIVATE HEALTH INSURANCE

## 2015-05-11 ENCOUNTER — Ambulatory Visit: Payer: PRIVATE HEALTH INSURANCE

## 2015-05-11 ENCOUNTER — Ambulatory Visit (HOSPITAL_COMMUNITY)
Admission: RE | Admit: 2015-05-11 | Discharge: 2015-05-11 | Disposition: A | Discharge status: Home / Self Care | Payer: Medicare Other | Source: Ambulatory Visit | Attending: Radiation Oncology | Admitting: Radiation Oncology

## 2015-05-11 DIAGNOSIS — C3430 Malignant neoplasm of lower lobe, unspecified bronchus or lung: Secondary | ICD-10-CM

## 2015-05-11 DIAGNOSIS — C3401 Malignant neoplasm of right main bronchus: Secondary | ICD-10-CM | POA: Diagnosis not present

## 2015-05-11 DIAGNOSIS — C3491 Malignant neoplasm of unspecified part of right bronchus or lung: Secondary | ICD-10-CM | POA: Diagnosis not present

## 2015-05-11 DIAGNOSIS — C3490 Malignant neoplasm of unspecified part of unspecified bronchus or lung: Secondary | ICD-10-CM

## 2015-05-11 LAB — BUN AND CREATININE (CC13)
BUN: 22.6 mg/dL (ref 7.0–26.0)
CREATININE: 0.8 mg/dL (ref 0.6–1.1)
EGFR: 69 mL/min/{1.73_m2} — AB (ref >90)

## 2015-05-11 MED ORDER — IOHEXOL 300 MG/ML  SOLN: 80.0000 mL | Freq: Once | INTRAMUSCULAR | Status: AC | PRN

## 2015-05-17 ENCOUNTER — Ambulatory Visit
Admission: RE | Admit: 2015-05-17 | Payer: PRIVATE HEALTH INSURANCE | Source: Ambulatory Visit | Admitting: Radiation Oncology

## 2015-05-31 ENCOUNTER — Ambulatory Visit
Admission: RE | Admit: 2015-05-31 | Discharge: 2015-05-31 | Disposition: A | Discharge status: Home / Self Care | Payer: Medicare Other | Source: Ambulatory Visit | Attending: Radiation Oncology | Admitting: Radiation Oncology

## 2015-05-31 ENCOUNTER — Encounter: Payer: Self-pay | Admitting: Radiation Oncology

## 2015-05-31 VITALS — BP 72/54 | HR 83 | Temp 98.3°F | Ht 60.0 in | Wt 98.2 lb

## 2015-05-31 DIAGNOSIS — C3431 Malignant neoplasm of lower lobe, right bronchus or lung: Secondary | ICD-10-CM

## 2015-05-31 NOTE — Progress Notes (Signed)
Radiation Oncology         [phone] ________________________________  Name: CALEDONIA ZOU MRN: [id number]  Date: 05/31/2015  DOB: March 06, 1931  Follow-Up Visit Note  CC: Philis Fendt, MD  Florina Ou, MD  Diagnosis:   Non-small cell lung cancer status post stereotactic body radiation treatment in April 2012  Narrative: The patient returns today for routine follow-up. Her hypotension is noted today. States she is lighted "sometimes" and admits that she was dehydrated 3 weeks ago and received IVF hydration. She denies taking any blood pressure medications and denies a prior medical hx of blood pressure complications. She denies pain and difficulty swallowing. She states she breathes well.  ALLERGIES:  is allergic to anesthetics, amide; boniva; ciprofloxacin; statins; and sulfa antibiotics.  Meds: Current Outpatient Prescriptions  Medication Sig Dispense Refill  . fenofibrate 160 MG tablet Take 160 mg by mouth.    . ondansetron (ZOFRAN ODT) 4 MG disintegrating tablet Take 1 tablet (4 mg total) by mouth every 8 (eight) hours as needed for nausea or vomiting. 20 tablet 0  . OVER THE COUNTER MEDICATION Place 1 drop into both eyes 2 (two) times daily. For dry eyes  OTC Eye Drops     No current facility-administered medications for this encounter.    Physical Findings: The patient is in no acute distress. Patient is alert and oriented.  height is 5' (1.524 m) and weight is 98 lb 3.2 oz (44.543 kg). Her temperature is 98.3 F (36.8 C). Her blood pressure is 72/54 and her pulse is 83. .   General: Alert and oriented, in no acute distress Heart: Regular in rate and rhythm with no murmurs, rubs, or gallops. Chest: Clear to auscultation bilaterally, with no rhonchi, wheezes, or rales.  Lab Findings: Lab Results  Component Value Date   WBC 10.3 04/17/2015   HGB 12.7 04/17/2015   HCT 39.1 04/17/2015   MCV 95.6 04/17/2015   PLT 297 04/17/2015    Radiographic Findings: Ct Chest W  Contrast  05/11/2015   CLINICAL DATA:  Right lung cancer restaging. Cancer diagnosed 2012. The range of carcinoma diagnosed 1997. History of right lung surgery and radiation therapy.  EXAM: CT CHEST WITH CONTRAST  TECHNIQUE: Multidetector CT imaging of the chest was performed during intravenous contrast administration.  CONTRAST:  80 cc Omnipaque 300  COMPARISON:  CT thorax 11 9 2015  FINDINGS: Mediastinum/Nodes: No axillary or supraclavicular lymphadenopathy. No mediastinal lymphadenopathy. There is indistinct tissue planes anterior to the carina. Right hilar lymph node measures 10 mm on image 23, series 2. A tracheostomy and stoma noted.  Lungs/Pleura: There is a band of parenchymal nodular thickening are within the right lower lobe measuring 22 mm in thickness compared to 32 mm on prior. Bronchiectasis extends from the hilum to this right lower lobe nodule thickening and not changed from prior.  Upper abdomen: Limited view of the liver, kidneys, pancreas are unremarkable. Normal adrenal glands.  Musculoskeletal: There are multiple fractures along the posterior lateral right chest wall involving the 6 seventh and eighth ribs. The sixth and seventh ribs are non healed. Eighth rib is healed. Rib fractures are unchanged 11/07/2014  IMPRESSION: 1. Interval decrease in thickness of the band of pleural-parenchymal thickening and bronchiectasis in the right lower lobe. 2. Again demonstrated right lateral chest wall rib fractures. 3. No evidence disease progression. 4. Indistinct tissue in the paratracheal mediastinum small right hilar lymph node unchanged.   Electronically Signed   By: Helane Gunther.D.  On: 05/11/2015 15:21    Impression:    The patient is clinically doing well. Her recent CT scan of the chest did not show any evidence of progressive disease.  Plan:  CT scan of the chest in 6 months, then follow-up appointment. I advised the pt to drink more fluids in order to bring up her blood  pressure.  This document serves as a record of services personally performed by Kyung Rudd, MD. It was created on his behalf by Darcus Austin, a trained medical scribe. The creation of this record is based on the scribe's personal observations and the provider's statements to them. This document has been checked and approved by the attending provider.     Jodelle Gross, M.D., Ph.D.

## 2015-05-31 NOTE — Progress Notes (Signed)
Ms. Tetrault here today s/p xrt for lung cancer.  Her hypotension noted today.  States she is lighted "sometimes" and admits that she was dehydrated 3 weeks ago and received IVF hydration.  Denies any pain today.   BP 72/54 mmHg  Pulse 83  Temp(Src) 98.3 F (36.8 C)  Ht 5' (1.524 m)  Wt 98 lb 3.2 oz (44.543 kg)  BMI 19.18 kg/m2.  O2 SAT 100%

## 2015-06-01 ENCOUNTER — Telehealth: Payer: Self-pay | Admitting: *Deleted

## 2015-06-01 NOTE — Telephone Encounter (Signed)
CALLED PATIENT TO GIVE APPOINTMENTS FOR 11/30/15 AND 12/06/15. PATIENT VERBALIZED UNDERSTANDING.

## 2015-06-07 ENCOUNTER — Encounter: Payer: Self-pay | Admitting: Skilled Nursing Facility1

## 2015-06-07 NOTE — Progress Notes (Signed)
Subjective:     Patient ID: Kim Hardy, female   DOB: [birth year], 81 y.o.   MRN: [id number]  HPI   Review of Systems     Objective:   Physical Exam To assist the pt in identifying dietary strategies to gain some lost wt back.    Assessment:     Pt was identified as being malnourished due to some lost wt. Pt was contacted via the telephone at [phone].    Plan:     No plan identified at this time.

## 2015-06-13 DIAGNOSIS — K219 Gastro-esophageal reflux disease without esophagitis: Secondary | ICD-10-CM | POA: Diagnosis not present

## 2015-06-13 DIAGNOSIS — C3490 Malignant neoplasm of unspecified part of unspecified bronchus or lung: Secondary | ICD-10-CM | POA: Diagnosis not present

## 2015-06-13 DIAGNOSIS — E784 Other hyperlipidemia: Secondary | ICD-10-CM | POA: Diagnosis not present

## 2015-07-19 DIAGNOSIS — H40053 Ocular hypertension, bilateral: Secondary | ICD-10-CM | POA: Diagnosis not present

## 2015-08-16 DIAGNOSIS — H4011X1 Primary open-angle glaucoma, mild stage: Secondary | ICD-10-CM | POA: Diagnosis not present

## 2015-09-06 DIAGNOSIS — H4011X1 Primary open-angle glaucoma, mild stage: Secondary | ICD-10-CM | POA: Diagnosis not present

## 2015-09-26 DIAGNOSIS — Z963 Presence of artificial larynx: Secondary | ICD-10-CM | POA: Diagnosis not present

## 2015-09-28 DIAGNOSIS — H4011X1 Primary open-angle glaucoma, mild stage: Secondary | ICD-10-CM | POA: Diagnosis not present

## 2015-11-30 ENCOUNTER — Encounter (HOSPITAL_COMMUNITY): Payer: Self-pay

## 2015-11-30 ENCOUNTER — Ambulatory Visit (HOSPITAL_COMMUNITY)
Admission: RE | Admit: 2015-11-30 | Discharge: 2015-11-30 | Disposition: A | Discharge status: Home / Self Care | Payer: Medicare Other | Source: Ambulatory Visit | Attending: Radiation Oncology | Admitting: Radiation Oncology

## 2015-11-30 ENCOUNTER — Ambulatory Visit
Admission: RE | Admit: 2015-11-30 | Discharge: 2015-11-30 | Disposition: A | Discharge status: Home / Self Care | Payer: Medicare Other | Source: Ambulatory Visit | Attending: Radiation Oncology | Admitting: Radiation Oncology

## 2015-11-30 DIAGNOSIS — Z0189 Encounter for other specified special examinations: Secondary | ICD-10-CM | POA: Insufficient documentation

## 2015-11-30 DIAGNOSIS — C3490 Malignant neoplasm of unspecified part of unspecified bronchus or lung: Secondary | ICD-10-CM | POA: Diagnosis not present

## 2015-11-30 DIAGNOSIS — C3431 Malignant neoplasm of lower lobe, right bronchus or lung: Secondary | ICD-10-CM

## 2015-11-30 DIAGNOSIS — Z9889 Other specified postprocedural states: Secondary | ICD-10-CM | POA: Diagnosis not present

## 2015-11-30 LAB — BUN AND CREATININE (CC13)
BUN: 18.8 mg/dL (ref 7.0–26.0)
Creatinine: 0.8 mg/dL (ref 0.6–1.1)
EGFR: 74 mL/min/{1.73_m2} — ABNORMAL LOW (ref >90)

## 2015-11-30 MED ORDER — IOHEXOL 300 MG/ML  SOLN
75.0000 mL | Freq: Once | INTRAMUSCULAR | Status: AC | PRN
  Administered 2015-11-30: 75 mL via INTRAVENOUS

## 2015-12-06 ENCOUNTER — Ambulatory Visit: Admission: RE | Admit: 2015-12-06 | Payer: Medicare Other | Source: Ambulatory Visit | Admitting: Radiation Oncology

## 2015-12-11 ENCOUNTER — Encounter: Payer: Self-pay | Admitting: Radiation Oncology

## 2015-12-11 ENCOUNTER — Ambulatory Visit
Admission: RE | Admit: 2015-12-11 | Discharge: 2015-12-11 | Disposition: A | Discharge status: Home / Self Care | Payer: Medicare Other | Source: Ambulatory Visit | Attending: Radiation Oncology | Admitting: Radiation Oncology

## 2015-12-11 ENCOUNTER — Telehealth: Payer: Self-pay | Admitting: *Deleted

## 2015-12-11 VITALS — BP 62/40 | HR 94 | Temp 97.9°F | Resp 16 | Ht 60.0 in | Wt 94.0 lb

## 2015-12-11 DIAGNOSIS — C3490 Malignant neoplasm of unspecified part of unspecified bronchus or lung: Secondary | ICD-10-CM | POA: Diagnosis not present

## 2015-12-11 NOTE — Progress Notes (Addendum)
BP 75/55 mmHg  Pulse 87  Temp(Src) 97.9 F (36.6 C)  Resp 16  Ht 5' (1.524 m)  Wt 94 lb (42.638 kg)  BMI 18.36 kg/m2  SpO2 95%  Sitting vitals BP 62/40 mmHg  Pulse 94  Temp(Src) 97.9 F (36.6 C)  Resp 16  Ht 5' (1.524 m)  Wt 94 lb (42.638 kg)  BMI 18.36 kg/m2  SpO2 95% standing vitals Follow up s/p SBRT  April 2012 Right  LUNG  No c/o dizzy ness or light headed, no pain, appetite poor,  Not on blood pressure medication,   Drinks 16 ounces water with breakfast today, no difficulty breathing  No difficulty swallowing foods stated No nausea, coughs up yellowish phelgm from her trach  1:02 PM Wt Readings from Last 3 Encounters:  12/11/15 94 lb (42.638 kg)  05/31/15 98 lb 3.2 oz (44.543 kg)  04/17/15 103 lb (46.72 kg)

## 2015-12-11 NOTE — Progress Notes (Addendum)
Radiation Oncology         [phone] ________________________________  Name: Kim Hardy MRN: [id number]  Date: 12/11/2015  DOB: 1931/05/14  Follow-Up Visit Note  CC: Philis Fendt, MD  Florina Ou, MD  Diagnosis:   Non-small cell lung cancer status post stereotactic body radiation treatment in April 2012  Narrative:  Follow up s/p SBRTApril 2012 RightLung. No c/o dizzyness or light headed, no pain, appetite poor, Not on blood pressure medication.Drinks 16 ounces water with breakfast today, no difficulty breathing No difficulty swallowing foods. Stated no nausea, coughs up yellowish phelgm from her trach.    ALLERGIES:  is allergic to anesthetics, amide; boniva; ciprofloxacin; statins; and sulfa antibiotics.  Meds: Current Outpatient Prescriptions  Medication Sig Dispense Refill  . fenofibrate 160 MG tablet Take 160 mg by mouth.    Marland Kitchen OVER THE COUNTER MEDICATION Place 1 drop into both eyes 2 (two) times daily. For dry eyes  OTC Eye Drops    . ondansetron (ZOFRAN ODT) 4 MG disintegrating tablet Take 1 tablet (4 mg total) by mouth every 8 (eight) hours as needed for nausea or vomiting. (Patient not taking: Reported on 12/11/2015) 20 tablet 0   No current facility-administered medications for this encounter.    Physical Findings: The patient is in no acute distress. Patient is alert and oriented.  height is 5' (1.524 m) and weight is 94 lb (42.638 kg). Her temperature is 97.9 F (36.6 C). Her blood pressure is 62/40 and her pulse is 94. Her respiration is 16 and oxygen saturation is 95%. .   General: Well-developed, in no acute distress HEENT: Normocephalic, atraumatic Cardiovascular: Regular rate and rhythm Respiratory: Clear to auscultation bilaterally  Lab Findings: Lab Results  Component Value Date   WBC 10.3 04/17/2015   HGB 12.7 04/17/2015   HCT 39.1 04/17/2015   MCV 95.6 04/17/2015   PLT 297 04/17/2015     Radiographic Findings: Ct Chest W  Contrast  11/30/2015  CLINICAL DATA:  Followup lung cancer diagnosed 2012. XRT therapy complete. Personal history of laryngeal carcinoma diagnosed 59. Tracheotomy. EXAM: CT CHEST WITH CONTRAST TECHNIQUE: Multidetector CT imaging of the chest was performed during intravenous contrast administration. CONTRAST:  35m OMNIPAQUE IOHEXOL 300 MG/ML  SOLN COMPARISON:  CT 05/11/2015 FINDINGS: Mediastinum/Nodes: No axillary supraclavicular lymphadenopathy. There is haziness within the mediastinal lower paratracheal and precarinal fat. No measurable lymph nodes. There is small pericardial effusion. No central pulmonary embolism. Small RIGHT hilar lymph node is again demonstrated measuring 8 mm unchanged from prior. Lungs/Pleura: There is a oblong mass in the RIGHT lower lobe extending from the bronchial tree measuring 32 x 25 mm compared to 30 x 22 mm. Increased peribronchial thickening RIGHT lower lobe extending to the mass. There is a linear reticular pattern surrounding this lesion is also mildly increased. There is a small RIGHT effusion which is mildly increased. No additional pulmonary nodules are present. Upper abdomen: There is osteotomy in the ribs of the posterior RIGHT lower hemi thorax. Musculoskeletal: No aggressive osseous lesion IMPRESSION: 1. Mild increase in the peribronchial thickening in the RIGHT lower lobe and interstitial thickening surrounding the RIGHT lower lobe mass. The mass itself is not significantly changed. 2. No evidence of mediastinal lymphadenopathy. Small RIGHT hilar lymph node is unchanged. 3. Postsurgical change in the RIGHT chest wall again noted. Electronically Signed   By: SSuzy BouchardM.D.   On: 11/30/2015 11:51    Impression: The patient clinically is doing well. Her recent CT scan of the  chest did not show any evidence of progressive disease.   Plan:  Follow up CT scan in 6 months.      ------------------------------------------------  Jodelle Gross, MD, PhD  This  document serves as a record of services personally performed by Kyung Rudd, MD. It was created on his behalf by Derek Mound, a trained medical scribe. The creation of this record is based on the scribe's personal observations and the provider's statements to them. This document has been checked and approved by the attending provider.

## 2015-12-11 NOTE — Telephone Encounter (Signed)
CALLED PATIENT TO INFORM OF LAB AND CT ON 06-10-16, SPOKE WITH PATIENT AND SHE IS AWARE OF THESE APPTS.

## 2015-12-28 DIAGNOSIS — H401111 Primary open-angle glaucoma, right eye, mild stage: Secondary | ICD-10-CM | POA: Diagnosis not present

## 2016-02-03 ENCOUNTER — Emergency Department (HOSPITAL_COMMUNITY)
Admission: EM | Admit: 2016-02-03 | Discharge: 2016-02-03 | Disposition: A | Discharge status: Home / Self Care | Payer: Medicare Other | Attending: Emergency Medicine | Admitting: Emergency Medicine

## 2016-02-03 ENCOUNTER — Encounter (HOSPITAL_COMMUNITY): Payer: Self-pay | Admitting: Emergency Medicine

## 2016-02-03 DIAGNOSIS — Z85118 Personal history of other malignant neoplasm of bronchus and lung: Secondary | ICD-10-CM | POA: Insufficient documentation

## 2016-02-03 DIAGNOSIS — Z79899 Other long term (current) drug therapy: Secondary | ICD-10-CM | POA: Diagnosis not present

## 2016-02-03 DIAGNOSIS — Z8521 Personal history of malignant neoplasm of larynx: Secondary | ICD-10-CM | POA: Insufficient documentation

## 2016-02-03 DIAGNOSIS — R42 Dizziness and giddiness: Secondary | ICD-10-CM | POA: Diagnosis present

## 2016-02-03 DIAGNOSIS — E7800 Pure hypercholesterolemia, unspecified: Secondary | ICD-10-CM | POA: Insufficient documentation

## 2016-02-03 DIAGNOSIS — Z87891 Personal history of nicotine dependence: Secondary | ICD-10-CM | POA: Insufficient documentation

## 2016-02-03 DIAGNOSIS — N390 Urinary tract infection, site not specified: Secondary | ICD-10-CM | POA: Diagnosis not present

## 2016-02-03 DIAGNOSIS — I951 Orthostatic hypotension: Secondary | ICD-10-CM | POA: Insufficient documentation

## 2016-02-03 LAB — URINE MICROSCOPIC-ADD ON: RBC / HPF: NONE SEEN RBC/hpf (ref 0–5)

## 2016-02-03 LAB — BASIC METABOLIC PANEL
Anion gap: 11 (ref 5–15)
BUN: 18 mg/dL (ref 6–20)
CALCIUM: 9 mg/dL (ref 8.9–10.3)
CO2: 21 mmol/L — ABNORMAL LOW (ref 22–32)
Chloride: 102 mmol/L (ref 101–111)
Creatinine, Ser: 0.79 mg/dL (ref 0.44–1.00)
GFR calc Af Amer: >60 mL/min (ref >60)
GLUCOSE: 98 mg/dL (ref 65–99)
Potassium: 3.7 mmol/L (ref 3.5–5.1)
Sodium: 134 mmol/L — ABNORMAL LOW (ref 135–145)

## 2016-02-03 LAB — URINALYSIS, ROUTINE W REFLEX MICROSCOPIC
BILIRUBIN URINE: NEGATIVE
Glucose, UA: NEGATIVE mg/dL
HGB URINE DIPSTICK: NEGATIVE
KETONES UR: NEGATIVE mg/dL
NITRITE: POSITIVE — AB
PH: 5.5 (ref 5.0–8.0)
Protein, ur: NEGATIVE mg/dL
Specific Gravity, Urine: 1.019 (ref 1.005–1.030)

## 2016-02-03 LAB — CBC
HCT: 38.5 % (ref 36.0–46.0)
Hemoglobin: 12.4 g/dL (ref 12.0–15.0)
MCH: 30.8 pg (ref 26.0–34.0)
MCHC: 32.2 g/dL (ref 30.0–36.0)
MCV: 95.5 fL (ref 78.0–100.0)
Platelets: 328 10*3/uL (ref 150–400)
RBC: 4.03 MIL/uL (ref 3.87–5.11)
RDW: 13.8 % (ref 11.5–15.5)
WBC: 9.3 10*3/uL (ref 4.0–10.5)

## 2016-02-03 LAB — CBG MONITORING, ED: Glucose-Capillary: 73 mg/dL (ref 65–99)

## 2016-02-03 MED ORDER — SODIUM CHLORIDE 0.9 % IV BOLUS (SEPSIS)
1000.0000 mL | Freq: Once | INTRAVENOUS | Status: AC
  Administered 2016-02-03: 1000 mL via INTRAVENOUS

## 2016-02-03 MED ORDER — CEPHALEXIN 500 MG PO CAPS
500.0000 mg | ORAL_CAPSULE | Freq: Four times a day (QID) | ORAL | Status: DC
Start: 2016-02-03 — End: 2016-03-08

## 2016-02-03 MED ORDER — ONDANSETRON 4 MG PO TBDP: 4.0000 mg | ORAL_TABLET | Freq: Three times a day (TID) | ORAL | Status: DC | PRN

## 2016-02-03 MED ORDER — CEPHALEXIN 500 MG PO CAPS
500.0000 mg | ORAL_CAPSULE | Freq: Once | ORAL | Status: AC
  Administered 2016-02-03: 500 mg via ORAL
  Filled 2016-02-03: qty 1

## 2016-02-03 NOTE — ED Notes (Signed)
She is able to ambulate without assistance to b.r. And back.

## 2016-02-03 NOTE — ED Provider Notes (Signed)
CSN: [id number]     Arrival date & time 02/03/16  1028 History   First MD Initiated Contact with Patient 02/03/16 1047     Chief Complaint  Patient presents with  . Dizziness  . Urinary Frequency     (Consider location/radiation/quality/duration/timing/severity/associated sxs/prior Treatment) Patient is a 82 y.o. female presenting with dizziness and frequency. The history is provided by the patient.  Dizziness Associated symptoms: no blood in stool, no chest pain, no diarrhea, no headaches, no shortness of breath, no vomiting and no weakness   Urinary Frequency Pertinent negatives include no chest pain, no abdominal pain, no headaches and no shortness of breath.  Patient c/o feeling lightheaded when went to stand up today.  Described dizziness, which patient indicates is lightheaded/faint feeling, not room spinning.  States has poor po intake at baseline, has eaten little in past few days.  Denies recent/acute weight loss. Has remote hx laryngeal ca/trach. Pt notes no recurrent. Indicates is also followed for right lung mass/cancer, but denies recent tx.  States follow up cts have shown no significant growth, and that they are just monitoring.  Denies headache. No cough or uri c/o. No chest pain or discomfort. No sob or unusual doe. No abd pain. No vomiting or diarrhea. No dysuria. Denies change in meds or new meds. Pt does indicate is out of her zofran and needs new rx.         Past Medical History  Diagnosis Date  . Hypercholesterolemia   . History of radiation therapy 04/01/2011- 4/11/212    right lung  lower lobe  . Allergy     sulfa-hives, boniva=tremors,statins=n/v  . 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 Lung cancer [facility]) dx'd 01-2011    xrt comp 03/2011   Past Surgical History  Procedure Laterality Date  . Laryngectomy  1997  . Voice prosthesis    . Tracheostomy     No family history on file. Social History  Substance Use Topics  . Smoking status: Former Smoker -- 1.00  packs/day for 20 years    Quit date: 03/30/1996  . Smokeless tobacco: None     Comment: quit w/dx laryngeal cancer 1997  . Alcohol Use: No   OB History    No data available     Review of Systems  Constitutional: Negative for fever and chills.  HENT: Negative for sore throat.   Eyes: Negative for pain and visual disturbance.  Respiratory: Negative for cough and shortness of breath.   Cardiovascular: Negative for chest pain.  Gastrointestinal: Negative for vomiting, abdominal pain, diarrhea and blood in stool.  Genitourinary: Positive for frequency. Negative for dysuria and flank pain.  Musculoskeletal: Negative for back pain and neck pain.  Skin: Negative for rash.  Neurological: Positive for dizziness and light-headedness. Negative for weakness, numbness and headaches.  Hematological: Does not bruise/bleed easily.  Psychiatric/Behavioral: Negative for confusion.      Allergies  Anesthetics, amide; Boniva; Ciprofloxacin; Statins; and Sulfa antibiotics  Home Medications   Prior to Admission medications   Medication Sig Start Date End Date Taking? Authorizing Provider  fenofibrate 160 MG tablet Take 160 mg by mouth.   Yes Historical Provider, MD  lactose free nutrition (BOOST) LIQD Take 237 mLs by mouth 3 (three) times daily between meals.   Yes Historical Provider, MD  ondansetron (ZOFRAN ODT) 4 MG disintegrating tablet Take 1 tablet (4 mg total) by mouth every 8 (eight) hours as needed for nausea or vomiting. 04/17/15  Yes  Carmin Muskrat, MD  OVER THE COUNTER MEDICATION Place 1 drop into both eyes 2 (two) times daily as needed (dry eyes).    Yes Historical Provider, MD   BP 123/60 mmHg  Pulse 103  Temp(Src) 97.6 F (36.4 C) (Oral)  Resp 18  SpO2 98% Physical Exam  Constitutional: She is oriented to person, place, and time. She appears well-developed and well-nourished. No distress.  HENT:  Head: Atraumatic.  Mouth/Throat: Oropharynx is clear and moist.  Eyes:  Conjunctivae are normal. Pupils are equal, round, and reactive to light. No scleral icterus.  Neck: Neck supple. No tracheal deviation present.  Trach site covered. No infection/drainage to area.   Cardiovascular: Normal rate, regular rhythm, normal heart sounds and intact distal pulses.  Exam reveals no gallop and no friction rub.   No murmur heard. Pulmonary/Chest: Effort normal and breath sounds normal. No respiratory distress.  Abdominal: Soft. Normal appearance and bowel sounds are normal. She exhibits no distension. There is no tenderness.  Genitourinary:  No cva tenderness  Musculoskeletal: She exhibits no edema or tenderness.  Neurological: She is alert and oriented to person, place, and time.  Motor intact bil. stre 5/5. Steady gait.   Skin: Skin is warm and dry. No rash noted. She is not diaphoretic.  Psychiatric: She has a normal mood and affect.  Nursing note and vitals reviewed.   ED Course  Procedures (including critical care time) Labs Review   Results for orders placed or performed during the hospital encounter of 14/48/18  Basic metabolic panel  Result Value Ref Range   Sodium 134 (L) 135 - 145 mmol/L   Potassium 3.7 3.5 - 5.1 mmol/L   Chloride 102 101 - 111 mmol/L   CO2 21 (L) 22 - 32 mmol/L   Glucose, Bld 98 65 - 99 mg/dL   BUN 18 6 - 20 mg/dL   Creatinine, Ser 0.79 0.44 - 1.00 mg/dL   Calcium 9.0 8.9 - 10.3 mg/dL   GFR calc non Af Amer >60 >60 mL/min   GFR calc Af Amer >60 >60 mL/min   Anion gap 11 5 - 15  CBC  Result Value Ref Range   WBC 9.3 4.0 - 10.5 K/uL   RBC 4.03 3.87 - 5.11 MIL/uL   Hemoglobin 12.4 12.0 - 15.0 g/dL   HCT 38.5 36.0 - 46.0 %   MCV 95.5 78.0 - 100.0 fL   MCH 30.8 26.0 - 34.0 pg   MCHC 32.2 30.0 - 36.0 g/dL   RDW 13.8 11.5 - 15.5 %   Platelets 328 150 - 400 K/uL  Urinalysis, Routine w reflex microscopic (not at [facility])  Result Value Ref Range   Color, Urine AMBER (A) YELLOW   APPearance CLOUDY (A) CLEAR   Specific Gravity,  Urine 1.019 1.005 - 1.030   pH 5.5 5.0 - 8.0   Glucose, UA NEGATIVE NEGATIVE mg/dL   Hgb urine dipstick NEGATIVE NEGATIVE   Bilirubin Urine NEGATIVE NEGATIVE   Ketones, ur NEGATIVE NEGATIVE mg/dL   Protein, ur NEGATIVE NEGATIVE mg/dL   Nitrite POSITIVE (A) NEGATIVE   Leukocytes, UA MODERATE (A) NEGATIVE  Urine microscopic-add on  Result Value Ref Range   Squamous Epithelial / LPF 6-30 (A) NONE SEEN   WBC, UA 6-30 0 - 5 WBC/hpf   RBC / HPF NONE SEEN 0 - 5 RBC/hpf   Bacteria, UA FEW (A) NONE SEEN   Casts HYALINE CASTS (A) NEGATIVE   Crystals CA OXALATE CRYSTALS (A) NEGATIVE   Urine-Other MUCOUS  PRESENT   CBG monitoring, ED  Result Value Ref Range   Glucose-Capillary 73 65 - 99 mg/dL      I have personally reviewed and evaluated these images and lab results as part of my medical decision-making.   EKG Interpretation   Date/Time:  Saturday February 03 2016 10:44:39 EST Ventricular Rate:  102 PR Interval:  139 QRS Duration: 92 QT Interval:  331 QTC Calculation: 431 R Axis:   79 Text Interpretation:  Sinus tachycardia Low voltage, precordial leads  Confirmed by Jeneen Rinks  MD, [place] ([id number]) on 02/03/2016 10:47:52 AM      MDM   Labs.  Reviewed nursing notes and prior charts for additional history.   Po fluids.   Discussed ua with pt.  Keflex po.    Recheck no pain, no nv.   Pt indicates feels ready for d/c.  No faintness/dizziness. No pain. Tolerating po.  bp 133/55. Hr 88.       Lajean Saver, MD 02/03/16 1320

## 2016-02-03 NOTE — ED Notes (Signed)
Bed: WA15 Expected date:  Expected time:  Means of arrival:  Comments: Triage 2  

## 2016-02-03 NOTE — ED Notes (Signed)
She smilingly tells me she feels "fine".

## 2016-02-03 NOTE — ED Notes (Signed)
Pt reports urinary frequency for a while and new onset dizziness this morning; denies pain.

## 2016-02-03 NOTE — Discharge Instructions (Signed)
It was our pleasure to provide your ER care today - we hope that you feel better.  Rest. Drink plenty of fluids.  When standing up from a lying position, sit on edge of chair or bed for a couple minutes, before trying to stand.   Take keflex (antibiotic) as prescribed.  Try to maximize your nutrition - eat balanced meal 3x/day.  Consider supplementing calory intake by drinking Ensure, Boost or other nutritious shake.   Follow up with your doctor this week - call office Monday to arrange follow up.  Return to ER if worse, new symptoms, fevers, weak/fainting, trouble breathing, other concern.      Urinary Tract Infection Urinary tract infections (UTIs) can develop anywhere along your urinary tract. Your urinary tract is your body's drainage system for removing wastes and extra water. Your urinary tract includes two kidneys, two ureters, a bladder, and a urethra. Your kidneys are a pair of bean-shaped organs. Each kidney is about the size of your fist. They are located below your ribs, one on each side of your spine. CAUSES Infections are caused by microbes, which are microscopic organisms, including fungi, viruses, and bacteria. These organisms are so small that they can only be seen through a microscope. Bacteria are the microbes that most commonly cause UTIs. SYMPTOMS  Symptoms of UTIs may vary by age and gender of the patient and by the location of the infection. Symptoms in young women typically include a frequent and intense urge to urinate and a painful, burning feeling in the bladder or urethra during urination. Older women and men are more likely to be tired, shaky, and weak and have muscle aches and abdominal pain. A fever may mean the infection is in your kidneys. Other symptoms of a kidney infection include pain in your back or sides below the ribs, nausea, and vomiting. DIAGNOSIS To diagnose a UTI, your caregiver will ask you about your symptoms. Your caregiver will also ask you to  provide a urine sample. The urine sample will be tested for bacteria and white blood cells. White blood cells are made by your body to help fight infection. TREATMENT  Typically, UTIs can be treated with medication. Because most UTIs are caused by a bacterial infection, they usually can be treated with the use of antibiotics. The choice of antibiotic and length of treatment depend on your symptoms and the type of bacteria causing your infection. HOME CARE INSTRUCTIONS  If you were prescribed antibiotics, take them exactly as your caregiver instructs you. Finish the medication even if you feel better after you have only taken some of the medication.  Drink enough water and fluids to keep your urine clear or pale yellow.  Avoid caffeine, tea, and carbonated beverages. They tend to irritate your bladder.  Empty your bladder often. Avoid holding urine for long periods of time.  Empty your bladder before and after sexual intercourse.  After a bowel movement, women should cleanse from front to back. Use each tissue only once. SEEK MEDICAL CARE IF:   You have back pain.  You develop a fever.  Your symptoms do not begin to resolve within 3 days. SEEK IMMEDIATE MEDICAL CARE IF:   You have severe back pain or lower abdominal pain.  You develop chills.  You have nausea or vomiting.  You have continued burning or discomfort with urination. MAKE SURE YOU:   Understand these instructions.  Will watch your condition.  Will get help right away if you are not doing well  or get worse.   This information is not intended to replace advice given to you by your health care provider. Make sure you discuss any questions you have with your health care provider.   Document Released: 09/25/2005 Document Revised: 09/06/2015 Document Reviewed: 01/24/2012 Elsevier Interactive Patient Education 2016 Else[employer] Inc[employer]     Orthostatic Hypotension Orthostatic hypotension is a sudden drop in blood  pressure. It happens when you quickly stand up from a seated or lying position. You may feel dizzy or light-headed. This can last for just a few seconds or for up to a few minutes. It is usually not a serious problem. However, if this happens frequently or gets worse, it can be a sign of something more serious. CAUSES  Different things can cause orthostatic hypotension, including:   Loss of body fluids (dehydration).  Medicines that lower blood pressure.  Sudden changes in posture, such as standing up quickly after you have been sitting or lying down.  Taking too much of your medicine. SIGNS AND SYMPTOMS   Light-headedness or dizziness.   Fainting or near-fainting.   A fast heart rate.   Weakness.   Feeling tired (fatigue).  DIAGNOSIS  Your health care provider may do several things to help diagnose your condition and identify the cause. These may include:   Taking a medical history and doing a physical exam.  Checking your blood pressure. Your health care provider will check your blood pressure when you are:  Lying down.  Sitting.  Standing.  Using tilt table testing. In this test, you lie down on a table that moves from a lying position to a standing position. You will be strapped onto the table. This test monitors your blood pressure and heart rate when you are in different positions. TREATMENT  Treatment will vary depending on the cause. Possible treatments include:   Changing the dosage of your medicines.  Wearing compression stockings on your lower legs.  Standing up slowly after sitting or lying down.  Eating more salt.  Eating frequent, small meals.  In some cases, getting IV fluids.  Taking medicine to enhance fluid retention. HOME CARE INSTRUCTIONS  Only take over-the-counter or prescription medicines as directed by your health care provider.  Follow your health care provider's instructions for changing the dosage of your current  medicines.  Do not stop or adjust your medicine on your own.  Stand up slowly after sitting or lying down. This allows your body to adjust to the different position.  Wear compression stockings as directed.  Eat extra salt as directed.  Do not add extra salt to your diet unless directed to by your health care provider.  Eat frequent, small meals.  Avoid standing suddenly after eating.  Avoid hot showers or excessive heat as directed by your health care provider.  Keep all follow-up appointments. SEEK MEDICAL CARE IF:  You continue to feel dizzy or light-headed after standing.  You feel groggy or confused.  You feel cold, clammy, or sick to your stomach (nauseous).  You have blurred vision.  You feel short of breath. SEEK IMMEDIATE MEDICAL CARE IF:   You faint after standing.  You have chest pain.  You have difficulty breathing.   You lose feeling or movement in your arms or legs.   You have slurred speech or difficulty talking, or you are unable to talk.  MAKE SURE YOU:   Understand these instructions.  Will watch your condition.  Will get help right away if you are not  doing well or get worse.   This information is not intended to replace advice given to you by your health care provider. Make sure you discuss any questions you have with your health care provider.   Document Released: 12/06/2002 Document Revised: 12/21/2013 Document Reviewed: 10/08/2013 Elsevier Interactive Patient Education [employer].

## 2016-02-03 NOTE — ED Notes (Signed)
Pt did not want anything to drink at this time, complained of nausea

## 2016-02-10 ENCOUNTER — Emergency Department (HOSPITAL_COMMUNITY)
Admission: EM | Admit: 2016-02-10 | Discharge: 2016-02-10 | Disposition: A | Discharge status: Home / Self Care | Payer: Medicare Other | Attending: Emergency Medicine | Admitting: Emergency Medicine

## 2016-02-10 ENCOUNTER — Encounter (HOSPITAL_COMMUNITY): Payer: Self-pay | Admitting: Emergency Medicine

## 2016-02-10 ENCOUNTER — Emergency Department (HOSPITAL_COMMUNITY): Payer: Medicare Other

## 2016-02-10 DIAGNOSIS — E7800 Pure hypercholesterolemia, unspecified: Secondary | ICD-10-CM | POA: Insufficient documentation

## 2016-02-10 DIAGNOSIS — Z792 Long term (current) use of antibiotics: Secondary | ICD-10-CM | POA: Insufficient documentation

## 2016-02-10 DIAGNOSIS — Z87891 Personal history of nicotine dependence: Secondary | ICD-10-CM | POA: Insufficient documentation

## 2016-02-10 DIAGNOSIS — R112 Nausea with vomiting, unspecified: Secondary | ICD-10-CM | POA: Diagnosis present

## 2016-02-10 DIAGNOSIS — Z8521 Personal history of malignant neoplasm of larynx: Secondary | ICD-10-CM | POA: Diagnosis not present

## 2016-02-10 DIAGNOSIS — Z79899 Other long term (current) drug therapy: Secondary | ICD-10-CM | POA: Insufficient documentation

## 2016-02-10 DIAGNOSIS — Z85118 Personal history of other malignant neoplasm of bronchus and lung: Secondary | ICD-10-CM | POA: Diagnosis not present

## 2016-02-10 DIAGNOSIS — K5732 Diverticulitis of large intestine without perforation or abscess without bleeding: Secondary | ICD-10-CM | POA: Diagnosis not present

## 2016-02-10 LAB — URINE MICROSCOPIC-ADD ON: RBC / HPF: NONE SEEN RBC/hpf (ref 0–5)

## 2016-02-10 LAB — URINALYSIS, ROUTINE W REFLEX MICROSCOPIC
Glucose, UA: NEGATIVE mg/dL
HGB URINE DIPSTICK: NEGATIVE
Ketones, ur: NEGATIVE mg/dL
Nitrite: NEGATIVE
PH: 6.5 (ref 5.0–8.0)
Protein, ur: NEGATIVE mg/dL
SPECIFIC GRAVITY, URINE: 1.018 (ref 1.005–1.030)

## 2016-02-10 LAB — COMPREHENSIVE METABOLIC PANEL
ALBUMIN: 3.3 g/dL — AB (ref 3.5–5.0)
ALT: 22 U/L (ref 14–54)
ANION GAP: 7 (ref 5–15)
AST: 40 U/L (ref 15–41)
Alkaline Phosphatase: 48 U/L (ref 38–126)
BUN: 17 mg/dL (ref 6–20)
CHLORIDE: 99 mmol/L — AB (ref 101–111)
CO2: 24 mmol/L (ref 22–32)
Calcium: 8.4 mg/dL — ABNORMAL LOW (ref 8.9–10.3)
Creatinine, Ser: 0.83 mg/dL (ref 0.44–1.00)
GFR calc Af Amer: >60 mL/min (ref >60)
GFR calc non Af Amer: >60 mL/min (ref >60)
GLUCOSE: 111 mg/dL — AB (ref 65–99)
POTASSIUM: 4.1 mmol/L (ref 3.5–5.1)
SODIUM: 130 mmol/L — AB (ref 135–145)
Total Bilirubin: 0.9 mg/dL (ref 0.3–1.2)
Total Protein: 6.9 g/dL (ref 6.5–8.1)

## 2016-02-10 LAB — CBC
HEMATOCRIT: 37.6 % (ref 36.0–46.0)
HEMOGLOBIN: 12.6 g/dL (ref 12.0–15.0)
MCH: 31.1 pg (ref 26.0–34.0)
MCHC: 33.5 g/dL (ref 30.0–36.0)
MCV: 92.8 fL (ref 78.0–100.0)
Platelets: 285 10*3/uL (ref 150–400)
RBC: 4.05 MIL/uL (ref 3.87–5.11)
RDW: 13.4 % (ref 11.5–15.5)
WBC: 10.9 10*3/uL — ABNORMAL HIGH (ref 4.0–10.5)

## 2016-02-10 LAB — LIPASE, BLOOD: LIPASE: 29 U/L (ref 11–51)

## 2016-02-10 MED ORDER — ONDANSETRON 4 MG PO TBDP: 4.0000 mg | ORAL_TABLET | Freq: Three times a day (TID) | ORAL | Status: DC | PRN

## 2016-02-10 MED ORDER — ONDANSETRON HCL 4 MG/2ML IJ SOLN
4.0000 mg | Freq: Once | INTRAMUSCULAR | Status: AC
  Administered 2016-02-10: 4 mg via INTRAVENOUS
  Filled 2016-02-10: qty 2

## 2016-02-10 MED ORDER — IOHEXOL 300 MG/ML  SOLN
100.0000 mL | Freq: Once | INTRAMUSCULAR | Status: AC | PRN
  Administered 2016-02-10: 80 mL via INTRAVENOUS

## 2016-02-10 MED ORDER — IOHEXOL 300 MG/ML  SOLN
25.0000 mL | Freq: Once | INTRAMUSCULAR | Status: AC | PRN
  Administered 2016-02-10: 25 mL via ORAL

## 2016-02-10 MED ORDER — SODIUM CHLORIDE 0.9 % IV BOLUS (SEPSIS)
1000.0000 mL | Freq: Once | INTRAVENOUS | Status: AC
  Administered 2016-02-10: 1000 mL via INTRAVENOUS

## 2016-02-10 MED ORDER — AMOXICILLIN-POT CLAVULANATE 875-125 MG PO TABS: 1.0000 | ORAL_TABLET | Freq: Two times a day (BID) | ORAL | Status: DC

## 2016-02-10 MED ORDER — AMOXICILLIN-POT CLAVULANATE 875-125 MG PO TABS
1.0000 | ORAL_TABLET | Freq: Once | ORAL | Status: AC
  Administered 2016-02-10: 1 via ORAL
  Filled 2016-02-10: qty 1

## 2016-02-10 NOTE — ED Provider Notes (Signed)
CSN: [id number]     Arrival date & time 02/10/16  1410 History   First MD Initiated Contact with Patient 02/10/16 1503     Chief Complaint  Patient presents with  . Nausea     (Consider location/radiation/quality/duration/timing/severity/associated sxs/prior Treatment) HPI   82 year old female with history of lung cancer and laryngeal cancer status post tracheostomy, radiation, and poor by mouth intake at baseline who presents for evaluation of nausea and decreased appetite. Patient states for the past 2 weeks she has had no appetite. She endorsed continuous nausea and occasional vomiting. She was seen in the ED on February 4 for her complaint. She was subsequent diagnosed with having a urinary tract infection and was started on Keflex. She has finished the antibiotic but continues to endorse persistent nausea. She has chronic left lower quadrant abdominal pain for the past several years which seems to be a bit more intense during the past 2 weeks. She described her abdominal pain as soreness sensation, nonradiating, worsening with palpation. Patient states she has been evaluated for this abdominal pain the past without any specific diagnosis or treatment. She does not think the pain has bothered her too much, probably just the nausea. She denies having fever, chills, headache, chest pain, shortness of breath, productive cough, dysuria, hematuria, hematochezia or melena. She does feels weak, and had not had a bowel movement in the past 3 days due to lack of intake. She does not have any antinausea medication at home.  Past Medical History  Diagnosis Date  . Hypercholesterolemia   . History of radiation therapy 04/01/2011- 4/11/212    right lung  lower lobe  . Allergy     sulfa-hives, boniva=tremors,statins=n/v  . 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 Lung cancer [facility]) dx'd 01-2011    xrt comp 03/2011   Past Surgical History  Procedure Laterality Date  . Laryngectomy  1997  . Voice  prosthesis    . Tracheostomy     No family history on file. Social History  Substance Use Topics  . Smoking status: Former Smoker -- 1.00 packs/day for 20 years    Quit date: 03/30/1996  . Smokeless tobacco: None     Comment: quit w/dx laryngeal cancer 1997  . Alcohol Use: No   OB History    No data available     Review of Systems  All other systems reviewed and are negative.     Allergies  Anesthetics, amide; Boniva; Ciprofloxacin; Statins; and Sulfa antibiotics  Home Medications   Prior to Admission medications   Medication Sig Start Date End Date Taking? Authorizing Provider  cephALEXin (KEFLEX) 500 MG capsule Take 1 capsule (500 mg total) by mouth 4 (four) times daily. 02/03/16   Lajean Saver, MD  fenofibrate 160 MG tablet Take 160 mg by mouth.    Historical Provider, MD  lactose free nutrition (BOOST) LIQD Take 237 mLs by mouth 3 (three) times daily between meals.    Historical Provider, MD  ondansetron (ZOFRAN ODT) 4 MG disintegrating tablet Take 1 tablet (4 mg total) by mouth every 8 (eight) hours as needed for nausea. 02/03/16   Tanna Furry, MD  OVER THE COUNTER MEDICATION Place 1 drop into both eyes 2 (two) times daily as needed (dry eyes).     Historical Provider, MD   BP 126/44 mmHg  Pulse 96  Temp(Src) 99.2 F (37.3 C) (Oral)  Resp 18  Ht 5' (1.524 m)  Wt 40.824 kg  BMI 17.58 kg/m2  SpO2 97% Physical Exam  Constitutional: She appears well-developed and well-nourished. No distress.  Cachectic appearing Caucasian female, has a tracheostomy, nontoxic.  HENT:  Head: Atraumatic.  Mouth/Throat: Oropharynx is clear and moist.  Eyes: Conjunctivae are normal.  Neck: Neck supple.  Tracheostomy  Cardiovascular: Normal rate and regular rhythm.   Pulmonary/Chest: Effort normal and breath sounds normal. No respiratory distress. She has no wheezes. She has no rales.  Abdominal: Soft. Bowel sounds are normal. She exhibits no distension. There is tenderness (Mild left  lower quadrant abdominal tenderness without guarding or rebound tenderness. No suprapubic tenderness.). There is no rebound and no guarding.  Neurological: She is alert.  Skin: No rash noted.  Psychiatric: She has a normal mood and affect.  Nursing note and vitals reviewed.   ED Course  Procedures (including critical care time) Labs Review Labs Reviewed  COMPREHENSIVE METABOLIC PANEL - Abnormal; Notable for the following:    Sodium 130 (*)    Chloride 99 (*)    Glucose, Bld 111 (*)    Calcium 8.4 (*)    Albumin 3.3 (*)    All other components within normal limits  CBC - Abnormal; Notable for the following:    WBC 10.9 (*)    All other components within normal limits  URINALYSIS, ROUTINE W REFLEX MICROSCOPIC (NOT AT [facility]) - Abnormal; Notable for the following:    Color, Urine AMBER (*)    APPearance CLOUDY (*)    Bilirubin Urine SMALL (*)    Leukocytes, UA SMALL (*)    All other components within normal limits  URINE MICROSCOPIC-ADD ON - Abnormal; Notable for the following:    Squamous Epithelial / LPF 0-5 (*)    Bacteria, UA RARE (*)    Crystals CA OXALATE CRYSTALS (*)    All other components within normal limits  LIPASE, BLOOD    Imaging Review Ct Abdomen Pelvis W Contrast  02/10/2016  CLINICAL DATA:  Continue nausea and recent diagnosis of UTIs, left lower quadrant pain EXAM: CT ABDOMEN AND PELVIS WITH CONTRAST TECHNIQUE: Multidetector CT imaging of the abdomen and pelvis was performed using the standard protocol following bolus administration of intravenous contrast. CONTRAST:  41m OMNIPAQUE IOHEXOL 300 MG/ML SOLN, 846mOMNIPAQUE IOHEXOL 300 MG/ML SOLN COMPARISON:  11/30/2015, 01/28/2012 FINDINGS: Stable parenchymal density is noted in the right lower lobe similar to that seen on the recent exam. The right-sided pleural effusion has reduced in the interval from the prior exam. The liver is diffusely decreased in attenuation consistent with fatty infiltration. Minimal  perihepatic fluid is noted. The spleen, adrenal glands, gallbladder and pancreas are within normal limits. Scattered nonobstructing renal stones are seen bilaterally. No obstructive calculi are noted. Diffuse aortoiliac calcifications are noted without aneurysmal dilatation. The appendix is within normal limits. Thickening of the sigmoid colon is noted with evidence of diverticular change consistent with diverticulitis. No obstructive changes are seen. No perforation or abscess formation is noted. Mild free flow pelvic fluid is noted. The uterus is within normal limits. The bony structures are within normal limits. IMPRESSION: Fatty liver. Stable changes in the right lower lobe. There has been reduction in the degree of right-sided pleural effusion. Changes consistent with diverticulitis in the sigmoid colon. No perforation or abscess is seen. Electronically Signed   By: MaInez Catalina.D.   On: 02/10/2016 18:43   I have personally reviewed and evaluated these images and lab results as part of my medical decision-making.   EKG Interpretation None  MDM   Final diagnoses:  Diverticulitis of large intestine without perforation or abscess without bleeding    BP 139/63 mmHg  Pulse 98  Temp(Src) 99.2 F (37.3 C) (Oral)  Resp 14  Ht 5' (1.524 m)  Wt 40.824 kg  BMI 17.58 kg/m2  SpO2 97%   3:31 PM  patient presents with early satiety, decreased by mouth intake, and feeling nauseous. This is been ongoing for the past 2 weeks. Given history of cancer and having abdominal pain, will obtain a CT scan for further evaluation. IV fluid, antinausea medication given I will recheck her labs and urinalysis.   6:54 PM Abdominal and pelvis CT scan demonstrated evidence of diverticulitis without perforation or abscess. Patient however is allergic to ciprofloxacin. Therefore, I will treat with Augmentin x 14 days.  Care discussed with Dr. Laneta Simmers.    7:36 PM Was noted that patient has low blood pressure on  the left arm, last documented blood pressure was 78/60 however normal blood pressure on her right arm with a last pressure of 139/63. These were check both manually and with electric blood pressure cuff. It appears the patient has similar low blood pressure in her left arm from prior visits. She denies having any arm pain, chest pain, lightheadedness or dizziness. She is able to ambulate without difficulty. She has normal strength to the bilateral upper extremities. She has no carotid bruit on examination. No CP or SOB.  I have low suspicion for aortic dissection or thoracic outlet obstruction.  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  However, this is likely chronic and unrelated to today's visit.    8:54 PM Patient is able to tolerate her by mouth medication. She feels better. She is able to cannulate. She will be discharged with Augmentin for 14 days along with antinausea medication. She is to follow-up with her primary care provider for further care. Return precaution discussed.  Domenic Moras, PA-C 02/10/16 2055  Leo Grosser, MD 02/11/16 [phone]

## 2016-02-10 NOTE — ED Notes (Signed)
Pt complaint of continued nausea since last visit when diagnosed with UTI; pt reports no emesis related to medication help/use but anorexia/inability to eat related to nausea.

## 2016-02-10 NOTE — ED Provider Notes (Signed)
Medical screening examination/treatment/procedure(s) were conducted as a shared visit with non-physician practitioner(s) and myself.  I personally evaluated the patient during the encounter.   EKG Interpretation None     82 y.o. female presents with left lower quadrant pain and nausea. No acute distress on exam with some left lower quadrant tenderness and guarding concerning for diverticulitis versus other pathology. CT scan ordered for high risk demographic patient. CT c/w diverticulitis. Will treat empirically, return precautions discussed.  See related encounter note   Leo Grosser, MD 02/11/16 1700

## 2016-02-10 NOTE — ED Notes (Signed)
Pt ambulated to bathroom 

## 2016-02-10 NOTE — ED Notes (Signed)
Pt ambulated to bathroom.  Re-check vs post ambulation

## 2016-02-10 NOTE — Discharge Instructions (Signed)
Diverticulitis Diverticulitis is when small pockets that have formed in your colon (large intestine) become infected or swollen. HOME CARE  Follow your doctor's instructions.  Follow a special diet if told by your doctor.  When you feel better, your doctor may tell you to change your diet. You may be told to eat a lot of fiber. Fruits and vegetables are good sources of fiber. Fiber makes it easier to poop (have bowel movements).  Take supplements or probiotics as told by your doctor.  Only take medicines as told by your doctor.  Keep all follow-up visits with your doctor. GET HELP IF:  Your pain does not get better.  You have a hard time eating food.  You are not pooping like normal. GET HELP RIGHT AWAY IF:  Your pain gets worse.  Your problems do not get better.  Your problems suddenly get worse.  You have a fever.  You keep throwing up (vomiting).  You have bloody or black, tarry poop (stool). MAKE SURE YOU:   Understand these instructions.  Will watch your condition.  Will get help right away if you are not doing well or get worse.   This information is not intended to replace advice given to you by your health care provider. Make sure you discuss any questions you have with your health care provider.   Document Released: 06/03/2008 Document Revised: 12/21/2013 Document Reviewed: 11/10/2013 Elsevier Interactive Patient Education [employer].

## 2016-02-19 ENCOUNTER — Ambulatory Visit: Payer: Self-pay | Admitting: Internal Medicine

## 2016-03-08 ENCOUNTER — Encounter: Payer: Self-pay | Admitting: Internal Medicine

## 2016-03-08 ENCOUNTER — Ambulatory Visit (INDEPENDENT_AMBULATORY_CARE_PROVIDER_SITE_OTHER): Payer: Medicare Other | Admitting: Internal Medicine

## 2016-03-08 VITALS — BP 110/64 | HR 100 | Temp 98.3°F | Resp 16 | Wt 89.0 lb

## 2016-03-08 DIAGNOSIS — K5732 Diverticulitis of large intestine without perforation or abscess without bleeding: Secondary | ICD-10-CM

## 2016-03-08 DIAGNOSIS — E781 Pure hyperglyceridemia: Secondary | ICD-10-CM | POA: Diagnosis not present

## 2016-03-08 DIAGNOSIS — K529 Noninfective gastroenteritis and colitis, unspecified: Secondary | ICD-10-CM | POA: Insufficient documentation

## 2016-03-08 DIAGNOSIS — C3490 Malignant neoplasm of unspecified part of unspecified bronchus or lung: Secondary | ICD-10-CM

## 2016-03-08 DIAGNOSIS — C801 Malignant (primary) neoplasm, unspecified: Secondary | ICD-10-CM | POA: Diagnosis not present

## 2016-03-08 MED ORDER — FENOFIBRATE 160 MG PO TABS: 160.0000 mg | ORAL_TABLET | Freq: Every day | ORAL | Status: DC

## 2016-03-08 MED ORDER — ONDANSETRON 8 MG PO TBDP: 8.0000 mg | ORAL_TABLET | Freq: Three times a day (TID) | ORAL | Status: DC | PRN

## 2016-03-08 NOTE — Patient Instructions (Signed)
It was nice to meet. Have the blood work done when you are able.   Test(s) ordered today. Your results will be released to [place] (or called to you) after review, usually within 72hours after test completion. If any changes need to be made, you will be notified at that same time.   Medications reviewed and updated.  Changes include increasing the zofran to 8 mg.   Your prescription(s) have been submitted to your pharmacy. Please take as directed and contact our office if you believe you are having problem(s) with the medication(s).   Please followup in 6 months, sooner if needed

## 2016-03-08 NOTE — Progress Notes (Signed)
Patient received education resource, including the self-management goal and tool. Patient verbalized understanding. 

## 2016-03-08 NOTE — Progress Notes (Signed)
Subjective:    Patient ID: Kim Hardy, female    DOB: [birth year], 82 y.o.   MRN: [id number]  HPI  She is here to establish with a new pcp.    She has a history of lung cancer (diagnosed 5 years ago) and laryngeal cancer ( diagnosed 20 years ago).  She is following with oncology.  She lives alone and is completely independent.  She talks through her trach site by covering the hole with her finger.  She drinks 4-5 boost a day.  Typically her appetite is fair, but it has been low since her recent bout of diverticulitis.    Diverticulitis:  She had a recent bout of diverticulitis in sigmoid colon.  She went to the ED on 2/11 with nausea, decreased appetite and lower abdominal pain.  She had a ct scan of her abdomen and she was diagnosed with diverticulitis.  She has never had diverticulitis in the past.  She feels better, but is still having some nausea.  Her nausea is improving.  She has been taking zofran and it helps, but when she was on the '8mg'$  in the past it was more effective. She is not sure what she should be eating - she is still eating a low fiber diet and most of the foods she typically eats are high in fiber.    Hypertriglyceridemia:  She has been on fenofibrate for a very long time.  She is unsure if she still needs it.  She does eat a low fat/cholesterol diet.    Medications and allergies reviewed with patient and updated if appropriate.  Patient Active Problem List   Diagnosis Date Noted  . Hypertriglyceridemia 03/08/2016  . Diverticulitis of colon 03/08/2016  . Cancer of lower lobe of lung ([place]) 01/31/2012  . laryngeal ca   . Lung cancer [facility])     Current Outpatient Prescriptions on File Prior to Visit  Medication Sig Dispense Refill  . lactose free nutrition (BOOST) LIQD Take 237 mLs by mouth 3 (three) times daily between meals.    Marland Kitchen OVER THE COUNTER MEDICATION Place 1 drop into both eyes 2 (two) times daily as needed (dry eyes).      No current facility-administered  medications on file prior to visit.    Past Medical History  Diagnosis Date  . Hypercholesterolemia   . History of radiation therapy 04/01/2011- 4/11/212    right lung  lower lobe  . Allergy     sulfa-hives, boniva=tremors,statins=n/v  . 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 Lung cancer [facility]) dx'd 01-2011    xrt comp 03/2011    Past Surgical History  Procedure Laterality Date  . Laryngectomy  1997  . Voice prosthesis    . Tracheostomy      Social History   Social History  . Marital Status: Divorced    Spouse Name: N/A  . Number of Children: N/A  . Years of Education: N/A   Social History Main Topics  . Smoking status: Former Smoker -- 1.00 packs/day for 20 years    Quit date: 03/30/1996  . Smokeless tobacco: None     Comment: quit w/dx laryngeal cancer 1997  . Alcohol Use: No  . Drug Use: No  . Sexual Activity: Not Asked   Other Topics Concern  . None   Social History Narrative    No family history on file.  Review of Systems  Constitutional: Positive for appetite change. Negative for fever.  HENT:  Negative for congestion and sore throat.   Respiratory: Positive for cough (chronic). Negative for shortness of breath and wheezing.   Cardiovascular: Negative for chest pain, palpitations and leg swelling.  Gastrointestinal: Positive for nausea. Negative for vomiting, abdominal pain and blood in stool.       Bowels just beginning to form  Genitourinary: Negative for dysuria and hematuria.  Musculoskeletal: Positive for back pain (chornic). Negative for arthralgias.  Neurological: Positive for light-headedness (if stands up quickly). Negative for dizziness and headaches.  Psychiatric/Behavioral: Negative for sleep disturbance and dysphoric mood. The patient is not nervous/anxious.        Objective:   Filed Vitals:   03/08/16 1355  BP: 110/64  Pulse: 100  Temp: 98.3 F (36.8 C)  Resp: 16   Filed Weights   03/08/16 1355  Weight: 89 lb (40.37 kg)   Body  mass index is 17.38 kg/(m^2).   Physical Exam Constitutional: She appears thin and chronically appearing  No distress.  HENT:  Head: Normocephalic and atraumatic.  Right Ear: External ear normal. Normal ear canal and TM Left Ear: External ear normal.  Normal ear canal and TM Mouth/Throat: Oropharynx is clear and moist.  Normal bilateral ear canals and tympanic membranes  Eyes: Conjunctivae and EOM are normal.  Neck: s/p trach - hole covered with gauze.  Neck supple. No tracheal deviation present. No thyromegaly present.   Cardiovascular: Normal rate, regular rhythm and normal heart sounds.   No murmur heard.  No edema. Pulmonary/Chest: Effort normal and breath sounds normal. No respiratory distress. She has no wheezes. She has no rales.  Abdominal: Soft. She exhibits no distension. There is mild tenderness in her LUQ without rebound or guarding.  No mass.  No LLQ tenderness.   Skin: Skin is warm and dry. She is not diaphoretic.  Psychiatric: She has a normal mood and affect. Her behavior is normal.        Assessment & Plan:   See Problem List for Assessment and Plan of chronic medical problems.

## 2016-03-09 NOTE — Assessment & Plan Note (Signed)
Diagnosed 5 years ago S/p radiation Following with oncology No evidence of recurrence

## 2016-03-09 NOTE — Assessment & Plan Note (Signed)
Check lipid panel, cmp

## 2016-03-09 NOTE — Assessment & Plan Note (Signed)
Diagnosed 20 years ago  Following with oncology

## 2016-03-09 NOTE — Assessment & Plan Note (Signed)
One episode 01/2016 Completed antibiotics Feeling better, stool just starting to become formed Still with some mild abdominal pain - not really in area of diverticulitis Advised her to advance diet slowly increase fiber zofran as needed Return if nausea does not improve

## 2016-04-01 ENCOUNTER — Encounter: Payer: Medicare Other | Admitting: Internal Medicine

## 2016-04-01 NOTE — Progress Notes (Signed)
    Subjective:    Patient ID: Kim Hardy, female    DOB: 1931/04/16, 82 y.o.   MRN: [id number]  HPI error  Medications and allergies reviewed with patient and updated if appropriate.  Patient Active Problem List   Diagnosis Date Noted  . Hypertriglyceridemia 03/08/2016  . Diverticulitis of colon 03/08/2016  . Cancer of lower lobe of lung ([place]) 01/31/2012  . laryngeal ca   . Lung cancer [facility])     Current Outpatient Prescriptions on File Prior to Visit  Medication Sig Dispense Refill  . fenofibrate 160 MG tablet Take 1 tablet (160 mg total) by mouth daily. 90 tablet 3  . lactose free nutrition (BOOST) LIQD Take 237 mLs by mouth 3 (three) times daily between meals.    . ondansetron (ZOFRAN ODT) 8 MG disintegrating tablet Take 1 tablet (8 mg total) by mouth every 8 (eight) hours as needed for nausea or vomiting. 60 tablet 5  . OVER THE COUNTER MEDICATION Place 1 drop into both eyes 2 (two) times daily as needed (dry eyes).      No current facility-administered medications on file prior to visit.    Past Medical History  Diagnosis Date  . Hypercholesterolemia   . History of radiation therapy 04/01/2011- 4/11/212    right lung  lower lobe  . Allergy     sulfa-hives, boniva=tremors,statins=n/v  . 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 Lung cancer [facility]) dx'd 01-2011    xrt comp 03/2011    Past Surgical History  Procedure Laterality Date  . Laryngectomy  1997  . Voice prosthesis    . Tracheostomy      Social History   Social History  . Marital Status: Divorced    Spouse Name: N/A  . Number of Children: N/A  . Years of Education: N/A   Social History Main Topics  . Smoking status: Former Smoker -- 1.00 packs/day for 20 years    Quit date: 03/30/1996  . Smokeless tobacco: Not on file     Comment: quit w/dx laryngeal cancer 1997  . Alcohol Use: No  . Drug Use: No  . Sexual Activity: Not on file   Other Topics Concern  . Not on file   Social History Narrative     No family history on file.  Review of Systems     Objective:  There were no vitals filed for this visit. There were no vitals filed for this visit. There is no weight on file to calculate BMI.   Physical Exam        Assessment & Plan:    This encounter was created in error - please disregard.

## 2016-04-18 ENCOUNTER — Ambulatory Visit (INDEPENDENT_AMBULATORY_CARE_PROVIDER_SITE_OTHER): Payer: Medicare Other | Admitting: Internal Medicine

## 2016-04-18 ENCOUNTER — Encounter: Payer: Self-pay | Admitting: Internal Medicine

## 2016-04-18 VITALS — BP 132/70 | HR 94 | Temp 98.5°F | Resp 16 | Wt 85.0 lb

## 2016-04-18 DIAGNOSIS — R109 Unspecified abdominal pain: Secondary | ICD-10-CM | POA: Diagnosis not present

## 2016-04-18 DIAGNOSIS — R110 Nausea: Secondary | ICD-10-CM | POA: Diagnosis not present

## 2016-04-18 DIAGNOSIS — R634 Abnormal weight loss: Secondary | ICD-10-CM

## 2016-04-18 DIAGNOSIS — R630 Anorexia: Secondary | ICD-10-CM | POA: Diagnosis not present

## 2016-04-18 MED ORDER — PROMETHAZINE HCL 25 MG PO TABS: 25.0000 mg | ORAL_TABLET | Freq: Three times a day (TID) | ORAL | Status: DC | PRN

## 2016-04-18 NOTE — Progress Notes (Signed)
Pre visit review using our clinic review tool, if applicable. No additional management support is needed unless otherwise documented below in the visit note. 

## 2016-04-18 NOTE — Progress Notes (Signed)
Subjective:    Patient ID: Kim Hardy, female    DOB: Apr 14, 1931, 82 y.o.   MRN: [id number]  HPI She is here for an acute visit.    Since she was here last she has been experiencing anorexia, weight loss, no energy, and nausea.  She denies consistent abdominal pain, but has pain on occasion.  She denies fever, diarrhea or blood in the stool.  Two days ago she had very hard stools, yesterday her stool was normal and she has not had a bowel movement today.  She has been eating soup and drinking boost, but continues to lose weight.    Medications and allergies reviewed with patient and updated if appropriate.  Patient Active Problem List   Diagnosis Date Noted  . Hypertriglyceridemia 03/08/2016  . Diverticulitis of colon 03/08/2016  . Cancer of lower lobe of lung ([place]) 01/31/2012  . laryngeal ca   . Lung cancer [facility])     Current Outpatient Prescriptions on File Prior to Visit  Medication Sig Dispense Refill  . fenofibrate 160 MG tablet Take 1 tablet (160 mg total) by mouth daily. 90 tablet 3  . lactose free nutrition (BOOST) LIQD Take 237 mLs by mouth 3 (three) times daily between meals.    . ondansetron (ZOFRAN ODT) 8 MG disintegrating tablet Take 1 tablet (8 mg total) by mouth every 8 (eight) hours as needed for nausea or vomiting. 60 tablet 5  . OVER THE COUNTER MEDICATION Place 1 drop into both eyes 2 (two) times daily as needed (dry eyes).      No current facility-administered medications on file prior to visit.    Past Medical History  Diagnosis Date  . Hypercholesterolemia   . History of radiation therapy 04/01/2011- 4/11/212    right lung  lower lobe  . Allergy     sulfa-hives, boniva=tremors,statins=n/v  . 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 Lung cancer [facility]) dx'd 01-2011    xrt comp 03/2011    Past Surgical History  Procedure Laterality Date  . Laryngectomy  1997  . Voice prosthesis    . Tracheostomy      Social History   Social History  . Marital  Status: Divorced    Spouse Name: N/A  . Number of Children: N/A  . Years of Education: N/A   Social History Main Topics  . Smoking status: Former Smoker -- 1.00 packs/day for 20 years    Quit date: 03/30/1996  . Smokeless tobacco: Not on file     Comment: quit w/dx laryngeal cancer 1997  . Alcohol Use: No  . Drug Use: No  . Sexual Activity: Not on file   Other Topics Concern  . Not on file   Social History Narrative    No family history on file.  Review of Systems  Constitutional: Positive for appetite change and unexpected weight change. Negative for fever.  Respiratory: Negative for cough, shortness of breath and wheezing.   Cardiovascular: Negative for chest pain, palpitations and leg swelling.  Gastrointestinal: Positive for nausea. Negative for abdominal pain and diarrhea.       Occ gerd, not new, not increased  Genitourinary: Negative for dysuria and hematuria.  Neurological: Negative for light-headedness and headaches.       Objective:   Filed Vitals:   04/18/16 1112  BP: 132/70  Pulse: 94  Temp: 98.5 F (36.9 C)  Resp: 16   Filed Weights   04/18/16 1112  Weight: 85 lb (  38.556 kg)   Body mass index is 16.6 kg/(m^2).   Physical Exam Constitutional: Appears well-developed and well-nourished. No distress.   Cardiovascular: Normal rate, regular rhythm and normal heart sounds.   No murmur heard.  No edema Pulmonary/Chest: Effort normal and breath sounds normal. No respiratory distress. No wheezes.  Abdomen: soft, tenderness in periumbilical area, RUQ and LMQ without rebound or guarding, no mass     Assessment & Plan:   Abdominal pain, nausea, anorexia, weight loss Since she had diverticulitis in February and she has been experiencing nausea, anorexia, intermittent abdominal pain with tenderness on exam today and weight loss Her abdomen is only mildly tender and I would expect it to be more tender if she had active diverticulitis, but given all of her  symptoms this needs to be ruled out-CT scan ordered Will check blood work including amylase, lipase, CBC, CMP and TSH Zofran was not effective for nausea-we will try promethazine Small frequent meals and continue boost supplementation Further evaluation and follow-up depending on above results    See Problem List for Assessment and Plan of chronic medical problems.

## 2016-04-18 NOTE — Patient Instructions (Signed)
  Test(s) ordered today. Your results will be released to [place] (or called to you) after review, usually within 72hours after test completion. If any changes need to be made, you will be notified at that same time.   Medications reviewed and updated.  Changes include trying phenergan for the nausea.   Your prescription(s) have been submitted to your pharmacy. Please take as directed and contact our office if you believe you are having problem(s) with the medication(s).  A cat scan was ordered and we will call you to schedule this.

## 2016-04-19 ENCOUNTER — Other Ambulatory Visit (INDEPENDENT_AMBULATORY_CARE_PROVIDER_SITE_OTHER): Payer: Medicare Other

## 2016-04-19 DIAGNOSIS — R634 Abnormal weight loss: Secondary | ICD-10-CM | POA: Diagnosis not present

## 2016-04-19 DIAGNOSIS — R110 Nausea: Secondary | ICD-10-CM | POA: Diagnosis not present

## 2016-04-19 DIAGNOSIS — R630 Anorexia: Secondary | ICD-10-CM | POA: Diagnosis not present

## 2016-04-19 DIAGNOSIS — R109 Unspecified abdominal pain: Secondary | ICD-10-CM | POA: Diagnosis not present

## 2016-04-19 LAB — CBC WITH DIFFERENTIAL/PLATELET
BASOS ABS: 0 10*3/uL (ref 0.0–0.1)
Basophils Relative: 0.3 % (ref 0.0–3.0)
EOS ABS: 0.3 10*3/uL (ref 0.0–0.7)
Eosinophils Relative: 3.7 % (ref 0.0–5.0)
HEMATOCRIT: 36.7 % (ref 36.0–46.0)
HEMOGLOBIN: 12.2 g/dL (ref 12.0–15.0)
LYMPHS PCT: 22.7 % (ref 12.0–46.0)
Lymphs Abs: 1.6 10*3/uL (ref 0.7–4.0)
MCHC: 33.2 g/dL (ref 30.0–36.0)
MCV: 93.1 fl (ref 78.0–100.0)
MONOS PCT: 8.8 % (ref 3.0–12.0)
Monocytes Absolute: 0.6 10*3/uL (ref 0.1–1.0)
Neutro Abs: 4.5 10*3/uL (ref 1.4–7.7)
Neutrophils Relative %: 64.5 % (ref 43.0–77.0)
Platelets: 341 10*3/uL (ref 150.0–400.0)
RBC: 3.95 Mil/uL (ref 3.87–5.11)
RDW: 14.7 % (ref 11.5–15.5)
WBC: 7 10*3/uL (ref 4.0–10.5)

## 2016-04-19 LAB — COMPREHENSIVE METABOLIC PANEL
ALK PHOS: 42 U/L (ref 39–117)
ALT: 16 U/L (ref 0–35)
AST: 32 U/L (ref 0–37)
Albumin: 3.3 g/dL — ABNORMAL LOW (ref 3.5–5.2)
BILIRUBIN TOTAL: 0.6 mg/dL (ref 0.2–1.2)
BUN: 19 mg/dL (ref 6–23)
CALCIUM: 9 mg/dL (ref 8.4–10.5)
CO2: 26 mEq/L (ref 19–32)
CREATININE: 0.64 mg/dL (ref 0.40–1.20)
Chloride: 101 mEq/L (ref 96–112)
GFR: 94.19 mL/min (ref >60.00)
Glucose, Bld: 116 mg/dL — ABNORMAL HIGH (ref 70–99)
Potassium: 4 mEq/L (ref 3.5–5.1)
Sodium: 135 mEq/L (ref 135–145)
TOTAL PROTEIN: 6.9 g/dL (ref 6.0–8.3)

## 2016-04-19 LAB — TSH: TSH: 3.48 u[IU]/mL (ref 0.35–4.50)

## 2016-04-19 LAB — LIPASE: LIPASE: 43 U/L (ref 11.0–59.0)

## 2016-04-19 LAB — AMYLASE: AMYLASE: 50 U/L (ref 27–131)

## 2016-04-22 ENCOUNTER — Ambulatory Visit (HOSPITAL_COMMUNITY)
Admission: RE | Admit: 2016-04-22 | Discharge: 2016-04-22 | Disposition: A | Discharge status: Home / Self Care | Payer: Medicare Other | Source: Ambulatory Visit | Attending: Internal Medicine | Admitting: Internal Medicine

## 2016-04-22 ENCOUNTER — Encounter (HOSPITAL_COMMUNITY): Payer: Self-pay | Admitting: Emergency Medicine

## 2016-04-22 ENCOUNTER — Emergency Department (HOSPITAL_COMMUNITY)
Admission: EM | Admit: 2016-04-22 | Discharge: 2016-04-22 | Disposition: A | Discharge status: Home / Self Care | Payer: Medicare Other | Attending: Emergency Medicine | Admitting: Emergency Medicine

## 2016-04-22 DIAGNOSIS — R112 Nausea with vomiting, unspecified: Secondary | ICD-10-CM | POA: Diagnosis not present

## 2016-04-22 DIAGNOSIS — R634 Abnormal weight loss: Secondary | ICD-10-CM

## 2016-04-22 DIAGNOSIS — R109 Unspecified abdominal pain: Secondary | ICD-10-CM | POA: Insufficient documentation

## 2016-04-22 DIAGNOSIS — K5730 Diverticulosis of large intestine without perforation or abscess without bleeding: Secondary | ICD-10-CM | POA: Insufficient documentation

## 2016-04-22 DIAGNOSIS — R630 Anorexia: Secondary | ICD-10-CM

## 2016-04-22 DIAGNOSIS — J90 Pleural effusion, not elsewhere classified: Secondary | ICD-10-CM

## 2016-04-22 DIAGNOSIS — R1032 Left lower quadrant pain: Secondary | ICD-10-CM | POA: Diagnosis not present

## 2016-04-22 DIAGNOSIS — I700 Atherosclerosis of aorta: Secondary | ICD-10-CM | POA: Insufficient documentation

## 2016-04-22 DIAGNOSIS — I313 Pericardial effusion (noninflammatory): Secondary | ICD-10-CM | POA: Insufficient documentation

## 2016-04-22 DIAGNOSIS — R110 Nausea: Secondary | ICD-10-CM

## 2016-04-22 DIAGNOSIS — K5790 Diverticulosis of intestine, part unspecified, without perforation or abscess without bleeding: Secondary | ICD-10-CM

## 2016-04-22 HISTORY — DX: Diverticulosis of intestine, part unspecified, without perforation or abscess without bleeding: K57.90

## 2016-04-22 MED ORDER — DIATRIZOATE MEGLUMINE & SODIUM 66-10 % PO SOLN
30.0000 mL | Freq: Once | ORAL | Status: AC
Start: 2016-04-22 — End: 2016-04-22
  Administered 2016-04-22: 30 mL via ORAL

## 2016-04-22 MED ORDER — IOPAMIDOL (ISOVUE-300) INJECTION 61%
100.0000 mL | Freq: Once | INTRAVENOUS | Status: AC | PRN
  Administered 2016-04-22: 100 mL via INTRAVENOUS

## 2016-04-22 NOTE — ED Notes (Signed)
Pt is supposed to go to CT for out-pt scan.  Not in ED

## 2016-04-22 NOTE — ED Notes (Signed)
Had diverticulitis in February. States she took 2 weeks of antibiotics without improvement. C/o LLQ pain, nausea, and vomiting. Pt went to PCP last week, had to get insurance approval before getting another CT scan, was sent here today.

## 2016-04-22 NOTE — ED Notes (Signed)
Called to re check vital signs, pt did not answer.

## 2016-04-22 NOTE — ED Notes (Signed)
Pt called three times, no response.

## 2016-04-23 ENCOUNTER — Telehealth: Payer: Self-pay | Admitting: Internal Medicine

## 2016-04-23 DIAGNOSIS — K529 Noninfective gastroenteritis and colitis, unspecified: Secondary | ICD-10-CM

## 2016-04-23 MED ORDER — AMOXICILLIN-POT CLAVULANATE 875-125 MG PO TABS: 1.0000 | ORAL_TABLET | Freq: Two times a day (BID) | ORAL | Status: DC

## 2016-04-23 NOTE — Telephone Encounter (Signed)
Yes, she needs to take it - all antibiotics can cause diarrhea

## 2016-04-23 NOTE — Telephone Encounter (Signed)
Looks like she was in ED yesterday - but was discharged?  Ct scan showed possible colitis  - antibiotic was sent to pharmacy.  Referral to GI was ordered

## 2016-04-23 NOTE — Telephone Encounter (Signed)
Pt called stated one of the side effect for this antibiotic is diarrhea, she has diarrhea 4 times already today, she was wondering if she still need to take this med? Please call her back

## 2016-04-23 NOTE — Telephone Encounter (Signed)
Spoke with pt to inform.  

## 2016-04-23 NOTE — Telephone Encounter (Signed)
Spoke with pt to inform of MDs response.

## 2016-04-24 ENCOUNTER — Emergency Department (HOSPITAL_COMMUNITY)
Admission: EM | Admit: 2016-04-24 | Discharge: 2016-04-24 | Disposition: A | Discharge status: Home / Self Care | Payer: Medicare Other | Attending: Emergency Medicine | Admitting: Emergency Medicine

## 2016-04-24 ENCOUNTER — Encounter (HOSPITAL_COMMUNITY): Payer: Self-pay | Admitting: Emergency Medicine

## 2016-04-24 ENCOUNTER — Telehealth: Payer: Self-pay | Admitting: Internal Medicine

## 2016-04-24 DIAGNOSIS — Z792 Long term (current) use of antibiotics: Secondary | ICD-10-CM | POA: Insufficient documentation

## 2016-04-24 DIAGNOSIS — Z85118 Personal history of other malignant neoplasm of bronchus and lung: Secondary | ICD-10-CM | POA: Diagnosis not present

## 2016-04-24 DIAGNOSIS — K529 Noninfective gastroenteritis and colitis, unspecified: Secondary | ICD-10-CM | POA: Diagnosis not present

## 2016-04-24 DIAGNOSIS — E7800 Pure hypercholesterolemia, unspecified: Secondary | ICD-10-CM | POA: Insufficient documentation

## 2016-04-24 DIAGNOSIS — Z87891 Personal history of nicotine dependence: Secondary | ICD-10-CM | POA: Insufficient documentation

## 2016-04-24 DIAGNOSIS — Z8521 Personal history of malignant neoplasm of larynx: Secondary | ICD-10-CM | POA: Insufficient documentation

## 2016-04-24 DIAGNOSIS — Z923 Personal history of irradiation: Secondary | ICD-10-CM | POA: Insufficient documentation

## 2016-04-24 DIAGNOSIS — Z79899 Other long term (current) drug therapy: Secondary | ICD-10-CM | POA: Diagnosis not present

## 2016-04-24 DIAGNOSIS — E860 Dehydration: Secondary | ICD-10-CM | POA: Diagnosis not present

## 2016-04-24 DIAGNOSIS — R197 Diarrhea, unspecified: Secondary | ICD-10-CM | POA: Diagnosis present

## 2016-04-24 LAB — COMPREHENSIVE METABOLIC PANEL
ALBUMIN: 3.6 g/dL (ref 3.5–5.0)
ALT: 19 U/L (ref 14–54)
ANION GAP: 15 (ref 5–15)
AST: 42 U/L — ABNORMAL HIGH (ref 15–41)
Alkaline Phosphatase: 42 U/L (ref 38–126)
BUN: 21 mg/dL — ABNORMAL HIGH (ref 6–20)
CO2: 18 mmol/L — AB (ref 22–32)
Calcium: 8.9 mg/dL (ref 8.9–10.3)
Chloride: 102 mmol/L (ref 101–111)
Creatinine, Ser: 0.95 mg/dL (ref 0.44–1.00)
GFR calc Af Amer: >60 mL/min (ref >60)
GFR calc non Af Amer: 54 mL/min — ABNORMAL LOW (ref >60)
GLUCOSE: 85 mg/dL (ref 65–99)
POTASSIUM: 3.8 mmol/L (ref 3.5–5.1)
SODIUM: 135 mmol/L (ref 135–145)
Total Bilirubin: 1.2 mg/dL (ref 0.3–1.2)
Total Protein: 7.5 g/dL (ref 6.5–8.1)

## 2016-04-24 LAB — URINALYSIS, ROUTINE W REFLEX MICROSCOPIC
Glucose, UA: NEGATIVE mg/dL
Hgb urine dipstick: NEGATIVE
Ketones, ur: NEGATIVE mg/dL
NITRITE: NEGATIVE
PH: 5 (ref 5.0–8.0)
Protein, ur: NEGATIVE mg/dL
SPECIFIC GRAVITY, URINE: 1.025 (ref 1.005–1.030)

## 2016-04-24 LAB — URINE MICROSCOPIC-ADD ON
BACTERIA UA: NONE SEEN
RBC / HPF: NONE SEEN RBC/hpf (ref 0–5)

## 2016-04-24 LAB — CBC
HEMATOCRIT: 40.6 % (ref 36.0–46.0)
HEMOGLOBIN: 13.4 g/dL (ref 12.0–15.0)
MCH: 31.4 pg (ref 26.0–34.0)
MCHC: 33 g/dL (ref 30.0–36.0)
MCV: 95.1 fL (ref 78.0–100.0)
Platelets: 346 10*3/uL (ref 150–400)
RBC: 4.27 MIL/uL (ref 3.87–5.11)
RDW: 14.1 % (ref 11.5–15.5)
WBC: 9.6 10*3/uL (ref 4.0–10.5)

## 2016-04-24 LAB — LIPASE, BLOOD: LIPASE: 33 U/L (ref 11–51)

## 2016-04-24 MED ORDER — SODIUM CHLORIDE 0.9 % IV BOLUS (SEPSIS)
1000.0000 mL | Freq: Once | INTRAVENOUS | Status: AC
  Administered 2016-04-24: 1000 mL via INTRAVENOUS

## 2016-04-24 MED ORDER — METRONIDAZOLE IN NACL 5-0.79 MG/ML-% IV SOLN
500.0000 mg | Freq: Once | INTRAVENOUS | Status: AC
  Administered 2016-04-24: 500 mg via INTRAVENOUS
  Filled 2016-04-24: qty 100

## 2016-04-24 MED ORDER — METRONIDAZOLE 500 MG PO TABS: 500.0000 mg | ORAL_TABLET | Freq: Two times a day (BID) | ORAL | Status: DC

## 2016-04-24 MED ORDER — PROMETHAZINE HCL 25 MG PO TABS: 25.0000 mg | ORAL_TABLET | Freq: Three times a day (TID) | ORAL | Status: DC | PRN

## 2016-04-24 MED ORDER — LEVOFLOXACIN 500 MG PO TABS: 500.0000 mg | ORAL_TABLET | Freq: Every day | ORAL | Status: DC

## 2016-04-24 MED ORDER — LEVOFLOXACIN IN D5W 750 MG/150ML IV SOLN
750.0000 mg | Freq: Once | INTRAVENOUS | Status: AC
  Administered 2016-04-24: 750 mg via INTRAVENOUS
  Filled 2016-04-24: qty 150

## 2016-04-24 NOTE — ED Notes (Signed)
LUE GIVES LOW BP EVERY TIME PER FAMILY AND PT. AS DEMONSTRATED ON MONITOR HOWEVER PER PT AND MONITOR BP ON RUE IS 132/58. PT IS ALERT AND ACTIVE WITH CARE.

## 2016-04-24 NOTE — ED Notes (Addendum)
PT UPON SITTING ON STRETCHER HR INCREASED TO 117-121 SUSTAINED. THIS WITNESSED BY ANNEAH NT.  PT STATED SHE STILL FELT WEAK. PT STATED SHE LIVED ALONE. PT ASSISTED BACK IN BED. EDP HAVILAND MADE AWARE. NO ORDERS GIVEN. DELAY IN [place]. PT MADE AWARE.

## 2016-04-24 NOTE — ED Notes (Signed)
MD at bedside. EDP MESNER PRESENT TO DISCUS PLAN OF CARE

## 2016-04-24 NOTE — ED Notes (Signed)
Tolerated crackers and water. Pt states she will need a ride home. She states she now does not believe she can drive home.

## 2016-04-24 NOTE — ED Notes (Signed)
MD at bedside. 

## 2016-04-24 NOTE — ED Provider Notes (Signed)
CSN: [id number]     Arrival date & time 04/24/16  1035 History   First MD Initiated Contact with Patient 04/24/16 1402     Chief Complaint  Patient presents with  . Diarrhea  . Nausea     (Consider location/radiation/quality/duration/timing/severity/associated sxs/prior Treatment) HPI  Past Medical History  Diagnosis Date  . Hypercholesterolemia   . History of radiation therapy 04/01/2011- 4/11/212    right lung  lower lobe  . Allergy     sulfa-hives, boniva=tremors,statins=n/v  . 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 Lung cancer [facility]) dx'd 01-2011    xrt comp 03/2011   Past Surgical History  Procedure Laterality Date  . Laryngectomy  1997  . Voice prosthesis    . Tracheostomy     No family history on file. Social History  Substance Use Topics  . Smoking status: Former Smoker -- 1.00 packs/day for 20 years    Quit date: 03/30/1996  . Smokeless tobacco: None     Comment: quit w/dx laryngeal cancer 1997  . Alcohol Use: No   OB History    No data available     Review of Systems    Allergies  Anesthetics, amide; Boniva; Ciprofloxacin; Statins; and Sulfa antibiotics  Home Medications   Prior to Admission medications   Medication Sig Start Date End Date Taking? Authorizing Provider  amoxicillin-clavulanate (AUGMENTIN) 875-125 MG tablet Take 1 tablet by mouth 2 (two) times daily. 04/23/16   [name], MD  fenofibrate 160 MG tablet Take 1 tablet (160 mg total) by mouth daily. 03/08/16   [name], MD  lactose free nutrition (BOOST) LIQD Take 237 mLs by mouth 3 (three) times daily between meals.    Historical Provider, MD  levofloxacin (LEVAQUIN) 500 MG tablet Take 1 tablet (500 mg total) by mouth daily. 04/24/16   Merrily Pew, MD  metroNIDAZOLE (FLAGYL) 500 MG tablet Take 1 tablet (500 mg total) by mouth 2 (two) times daily. One po bid x 7 days 04/24/16   Merrily Pew, MD  OVER THE COUNTER MEDICATION Place 1 drop into both eyes 2 (two) times daily as needed (dry  eyes).     Historical Provider, MD  promethazine (PHENERGAN) 25 MG tablet Take 1 tablet (25 mg total) by mouth every 8 (eight) hours as needed for nausea or vomiting. 04/24/16   Merrily Pew, MD   BP 120/56 mmHg  Pulse 98  Temp(Src) 98.2 F (36.8 C) (Oral)  Resp 18  Wt 85 lb (38.556 kg)  SpO2 98% Physical Exam  ED Course  Procedures (including critical care time) Labs Review Labs Reviewed  COMPREHENSIVE METABOLIC PANEL - Abnormal; Notable for the following:    CO2 18 (*)    BUN 21 (*)    AST 42 (*)    GFR calc non Af Amer 54 (*)    All other components within normal limits  URINALYSIS, ROUTINE W REFLEX MICROSCOPIC (NOT AT [facility]) - Abnormal; Notable for the following:    Color, Urine AMBER (*)    APPearance CLOUDY (*)    Bilirubin Urine LARGE (*)    Leukocytes, UA TRACE (*)    All other components within normal limits  URINE MICROSCOPIC-ADD ON - Abnormal; Notable for the following:    Squamous Epithelial / LPF 0-5 (*)    Casts HYALINE CASTS (*)    All other components within normal limits  LIPASE, BLOOD  CBC    Imaging Review No results found. I have personally  reviewed and evaluated these images and lab results as part of my medical decision-making.   EKG Interpretation None      MDM  NURSE CONCERNED ABOUT TACHYCARDIA.  PT GIVEN AN ADDITIONAL L OF FLUID AND HR IS BETTER.  PT FEELS BETTER. SHE KNOWS TO RETURN IF WORSE. Final diagnoses:  Dehydration  Colitis       Isla Pence, MD 04/24/16 [phone]

## 2016-04-24 NOTE — ED Provider Notes (Signed)
CSN: [id number]     Arrival date & time 04/24/16  1035 History   First MD Initiated Contact with Patient 04/24/16 1402     Chief Complaint  Patient presents with  . Diarrhea  . Nausea     (Consider location/radiation/quality/duration/timing/severity/associated sxs/prior Treatment) Patient is a 82 y.o. female presenting with diarrhea.  Diarrhea Quality:  Watery Severity:  Mild Relieved by:  Nothing Worsened by:  Nothing tried Ineffective treatments:  None tried Associated symptoms: vomiting   Associated symptoms: no abdominal pain, no arthralgias, no chills and no fever     Past Medical History  Diagnosis Date  . Hypercholesterolemia   . History of radiation therapy 04/01/2011- 4/11/212    right lung  lower lobe  . Allergy     sulfa-hives, boniva=tremors,statins=n/v  . 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 Lung cancer [facility]) dx'd 01-2011    xrt comp 03/2011   Past Surgical History  Procedure Laterality Date  . Laryngectomy  1997  . Voice prosthesis    . Tracheostomy     No family history on file. Social History  Substance Use Topics  . Smoking status: Former Smoker -- 1.00 packs/day for 20 years    Quit date: 03/30/1996  . Smokeless tobacco: None     Comment: quit w/dx laryngeal cancer 1997  . Alcohol Use: No   OB History    No data available     Review of Systems  Constitutional: Negative for fever and chills.  Cardiovascular: Negative for chest pain.  Gastrointestinal: Positive for nausea, vomiting and diarrhea. Negative for abdominal pain.  Genitourinary: Negative for urgency and vaginal pain.  Musculoskeletal: Negative for back pain and arthralgias.  All other systems reviewed and are negative.     Allergies  Anesthetics, amide; Boniva; Ciprofloxacin; Statins; and Sulfa antibiotics  Home Medications   Prior to Admission medications   Medication Sig Start Date End Date Taking? Authorizing Provider  amoxicillin-clavulanate (AUGMENTIN) 875-125 MG  tablet Take 1 tablet by mouth 2 (two) times daily. 04/23/16   [name], MD  fenofibrate 160 MG tablet Take 1 tablet (160 mg total) by mouth daily. 03/08/16   [name], MD  lactose free nutrition (BOOST) LIQD Take 237 mLs by mouth 3 (three) times daily between meals.    Historical Provider, MD  levofloxacin (LEVAQUIN) 500 MG tablet Take 1 tablet (500 mg total) by mouth daily. 04/24/16   Merrily Pew, MD  metroNIDAZOLE (FLAGYL) 500 MG tablet Take 1 tablet (500 mg total) by mouth 2 (two) times daily. One po bid x 7 days 04/24/16   Merrily Pew, MD  OVER THE COUNTER MEDICATION Place 1 drop into both eyes 2 (two) times daily as needed (dry eyes).     Historical Provider, MD  promethazine (PHENERGAN) 25 MG tablet Take 1 tablet (25 mg total) by mouth every 8 (eight) hours as needed for nausea or vomiting. 04/24/16   Merrily Pew, MD   BP 117/91 mmHg  Pulse 105  Temp(Src) 97.6 F (36.4 C) (Oral)  Resp 19  Wt 85 lb (38.556 kg)  SpO2 98% Physical Exam  Constitutional: She appears well-developed and well-nourished.  HENT:  Head: Normocephalic and atraumatic.  Neck: Normal range of motion.  Cardiovascular: Normal rate and regular rhythm.   Pulmonary/Chest: No stridor. No respiratory distress.  Abdominal: Soft. Bowel sounds are normal. She exhibits no distension. There is no tenderness. There is no rebound and no guarding.  Neurological: She is alert.  Nursing note and vitals reviewed.   ED Course  Procedures (including critical care time) Labs Review Labs Reviewed  COMPREHENSIVE METABOLIC PANEL - Abnormal; Notable for the following:    CO2 18 (*)    BUN 21 (*)    AST 42 (*)    GFR calc non Af Amer 54 (*)    All other components within normal limits  URINALYSIS, ROUTINE W REFLEX MICROSCOPIC (NOT AT [facility]) - Abnormal; Notable for the following:    Color, Urine AMBER (*)    APPearance CLOUDY (*)    Bilirubin Urine LARGE (*)    Leukocytes, UA TRACE (*)    All other components within  normal limits  URINE MICROSCOPIC-ADD ON - Abnormal; Notable for the following:    Squamous Epithelial / LPF 0-5 (*)    Casts HYALINE CASTS (*)    All other components within normal limits  LIPASE, BLOOD  CBC    Imaging Review No results found. I have personally reviewed and evaluated these images and lab results as part of my medical decision-making.   EKG Interpretation None      MDM   Final diagnoses:  Dehydration  Colitis    Known colitis, nausea/vomiting after augmentin yesterday. Here without nausea. Slight dehydration on labs and exam. Bolus of fluids and will switch to levaquin/flagyl. Will return if not improving or worsening at any point.   New Prescriptions: New Prescriptions   LEVOFLOXACIN (LEVAQUIN) 500 MG TABLET    Take 1 tablet (500 mg total) by mouth daily.   METRONIDAZOLE (FLAGYL) 500 MG TABLET    Take 1 tablet (500 mg total) by mouth 2 (two) times daily. One po bid x 7 days     I have personally and contemperaneously reviewed labs and imaging and used in my decision making as above.   A medical screening exam was performed and I feel the patient has had an appropriate workup for their chief complaint at this time and likelihood of emergent condition existing is low. Their vital signs are stable. They have been counseled on decision, discharge, follow up and which symptoms necessitate immediate return to the emergency department.  They verbally stated understanding and agreement with plan and discharged in stable condition.      Merrily Pew, MD 04/25/16 [phone]

## 2016-04-24 NOTE — Telephone Encounter (Signed)
[facility] - Client Onset Call Center  Patient Name: Kim Hardy  DOB: 10-06-1931    Initial Comment Caller states she started new rx last night, has sore protrusion on left side   Nurse Assessment  Nurse: Wayne Sever, RN, Tillie Rung Date/Time (Eastern Time): 04/24/2016 9:58:20 AM  Confirm and document reason for call. If symptomatic, describe symptoms. You must click the next button to save text entered. ---Caller states she started a new prescription last night, she states Augmentin yesterday, she thinks it's for Diverticulitis.. She is unsure of why she is taking it, she states she was having problems with her bowels. She states she is nauseated. She states when she got up this morning, she states she has a small protrusion on the left side of her stomach. Denies any fever  Has the patient traveled out of the country within the last 30 days? ---Not Applicable  Does the patient have any new or worsening symptoms? ---Yes  Will a triage be completed? ---Yes  Related visit to physician within the last 2 weeks? ---No  Does the PT have any chronic conditions? (i.e. diabetes, asthma, etc.) ---No  Is this a behavioral health or substance abuse call? ---No     Guidelines    Guideline Title Affirmed Question Affirmed Notes  Vomiting High-risk adult (e.g., diabetes mellitus, brain tumor, V-P shunt, hernia)    Final Disposition User   Go to ED Now (or PCP triage) Wayne Sever, RN, Kendra    Referrals  [facility] - ED   Disagree/Comply: Comply

## 2016-04-24 NOTE — ED Notes (Signed)
Crackers and water given per pt's request

## 2016-04-24 NOTE — ED Notes (Signed)
Provider asked to feed pt and keep evaluating hr rate on reassessment

## 2016-04-24 NOTE — ED Notes (Signed)
Pt reports diarrhea and nausea/vomiting since last night. States she has been dealing with diverticulitis for a while and hasn't gotten over it. CT abdomen was done 2 days ago.

## 2016-04-24 NOTE — ED Notes (Signed)
PT AWARE OF NEED FOR URINE SAMPLE. 

## 2016-05-02 ENCOUNTER — Telehealth: Payer: Self-pay | Admitting: Internal Medicine

## 2016-05-02 NOTE — Telephone Encounter (Signed)
[facility] Call Center  Patient Name: [name]  DOB: [birth year]    Initial Comment Caller states his mother went to the ER about 8 days ago, and she hasn't gotten any better. She's still vomiting and nauseous.   Nurse Assessment  Nurse: Harlow Mares, RN, Suanne Marker Date/Time (Eastern Time): 05/02/2016 4:42:56 PM  Confirm and document reason for call. If symptomatic, describe symptoms. You must click the next button to save text entered. ---Caller states his mother went to the ER about 8 days ago, and she hasn't gotten any better. She's still vomiting and nauseous. Was in the ED 8 days ago for colitis/diverticulitis. Was placed on abx x2. Reports that she continues to take these abx. Levoflaxacin is one of the abx/Metronazole. Reports that the patient has vomited x2 today. Today is the first day of vomiting. Not vomiting meds. No fever. Undigested food. Also reports loose stools x 3 /day for the last 7-8 days.  Has the patient traveled out of the country within the last 30 days? ---No  Does the patient have any new or worsening symptoms? ---Yes  Will a triage be completed? ---Yes  Related visit to physician within the last 2 weeks? ---Yes  Does the PT have any chronic conditions? (i.e. diabetes, asthma, etc.) ---Yes  List chronic conditions. ---high chol  Is this a behavioral health or substance abuse call? ---No     Guidelines    Guideline Title Affirmed Question Affirmed Notes  Vomiting MILD or MODERATE vomiting (e.g., 1 - 5 times / day) (all triage questions negative)    Final Disposition User   [place], RN, Suanne Marker    Disagree/Comply: Comply

## 2016-06-06 ENCOUNTER — Encounter: Payer: Self-pay | Admitting: Radiation Oncology

## 2016-06-10 ENCOUNTER — Ambulatory Visit: Payer: Medicare Other | Attending: Radiation Oncology

## 2016-06-10 ENCOUNTER — Ambulatory Visit (HOSPITAL_COMMUNITY): Payer: Medicare Other

## 2016-06-12 ENCOUNTER — Ambulatory Visit: Admission: RE | Admit: 2016-06-12 | Payer: Medicare Other | Source: Ambulatory Visit | Admitting: Radiation Oncology

## 2016-06-12 HISTORY — DX: Diverticulosis of intestine, part unspecified, without perforation or abscess without bleeding: K57.90

## 2016-07-10 ENCOUNTER — Encounter: Payer: Self-pay | Admitting: Internal Medicine

## 2016-07-10 ENCOUNTER — Ambulatory Visit (INDEPENDENT_AMBULATORY_CARE_PROVIDER_SITE_OTHER): Payer: Medicare Other | Admitting: Internal Medicine

## 2016-07-10 VITALS — BP 124/62 | HR 87 | Temp 98.4°F | Resp 16 | Wt 82.0 lb

## 2016-07-10 DIAGNOSIS — K5732 Diverticulitis of large intestine without perforation or abscess without bleeding: Secondary | ICD-10-CM

## 2016-07-10 DIAGNOSIS — R630 Anorexia: Secondary | ICD-10-CM | POA: Diagnosis not present

## 2016-07-10 DIAGNOSIS — F329 Major depressive disorder, single episode, unspecified: Secondary | ICD-10-CM | POA: Insufficient documentation

## 2016-07-10 DIAGNOSIS — R634 Abnormal weight loss: Secondary | ICD-10-CM | POA: Insufficient documentation

## 2016-07-10 DIAGNOSIS — F32A Depression, unspecified: Secondary | ICD-10-CM

## 2016-07-10 MED ORDER — SERTRALINE HCL 25 MG PO TABS: 25.0000 mg | ORAL_TABLET | Freq: Every day | ORAL | Status: DC

## 2016-07-10 NOTE — Assessment & Plan Note (Signed)
Started when she had diverticulitis Losing weight Causing some depression, which may also be causing some weight loss Discussed a one month trial of megace, but will first try sertraline to see if that helps with her mild depression and boost her appetite Follow up in one month

## 2016-07-10 NOTE — Patient Instructions (Addendum)
Have the prevnar pneumonia vaccine at your pharmacy.    All other Health Maintenance issues reviewed.   All recommended immunizations and age-appropriate screenings are up-to-date or discussed.  No immunizations administered today.   Medications reviewed and updated.  Changes include starting sertraline for depression and to hopefully increase your appetite.  Start at 12.5 mg nightly for a few nights and increase to 25 mg if you tolerate the medication.   Your prescription(s) have been submitted to your pharmacy. Please take as directed and contact our office if you believe you are having problem(s) with the medication(s).   Please followup in 4 weeks

## 2016-07-10 NOTE — Assessment & Plan Note (Signed)
Has had significant anorexia and therefore decreased PO intake - that is likely the cause No evidence of cancer recurrence and symptoms started with diverticulitis She does have follow up to schedule with oncology and will do that Follow up in one month

## 2016-07-10 NOTE — Progress Notes (Signed)
Pre visit review using our clinic review tool, if applicable. No additional management support is needed unless otherwise documented below in the visit note. 

## 2016-07-10 NOTE — Assessment & Plan Note (Signed)
Mild Will start sertraline 12.5 mg daily for a few days and then increase to 25 mg if she tolerates the medication Discussed possible side effects Call with questions F/u in one month

## 2016-07-10 NOTE — Progress Notes (Signed)
Subjective:    Patient ID: Kim Hardy, female    DOB: [birth year], 82 y.o.   MRN: [id number]  HPI She is here for follow up.  Diverticulitis:  She initially took augmentin, but was still having symptoms and ended up going to the ED.  She was put on levaquin and flagyl and did not tolerate either medication.  She does not think she can ever take those meds again.  She denies diarrhea, abdominal pain and fever and feels the diverticulitis has resolved. She started having difficulty with her appetite with her first episode of diverticulitis.   Problem eating/anorexia:   She has to force herself to eat.  She drinks a lot of boost but is losing weight.  She denies stomach pain, nausea or diarrhea. She does not feel like eating anything.  She does feel fatigued, general weakness and a little depressed because of how sick she has been feeling. She is concerned about losing more weight.   She has a follow up to schedule with her oncologist, but there has been no evidence of recurrence of either cancer.   Medications and allergies reviewed with patient and updated if appropriate.  Patient Active Problem List   Diagnosis Date Noted  . Hypertriglyceridemia 03/08/2016  . Diverticulitis of colon 03/08/2016  . Cancer of lower lobe of lung ([place]) 01/31/2012  . laryngeal ca   . Lung cancer [facility])     Current Outpatient Prescriptions on File Prior to Visit  Medication Sig Dispense Refill  . fenofibrate 160 MG tablet Take 1 tablet (160 mg total) by mouth daily. 90 tablet 3  . lactose free nutrition (BOOST) LIQD Take 237 mLs by mouth 3 (three) times daily between meals.    Marland Kitchen OVER THE COUNTER MEDICATION Place 1 drop into both eyes 2 (two) times daily as needed (dry eyes).     . promethazine (PHENERGAN) 25 MG tablet Take 1 tablet (25 mg total) by mouth every 8 (eight) hours as needed for nausea or vomiting. 30 tablet 0   No current facility-administered medications on file prior to visit.    Past  Medical History  Diagnosis Date  . Hypercholesterolemia   . History of radiation therapy 04/01/2011- 4/11/212    right lung  lower lobe  . Allergy     sulfa-hives, boniva=tremors,statins=n/v  . 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 Lung cancer ([place]) dx'd 01-2011    xrt comp 03/2011  . Diverticulosis 04/22/16    ct abd/pelvis    Past Surgical History  Procedure Laterality Date  . Laryngectomy  1997  . Voice prosthesis    . Tracheostomy      Social History   Social History  . Marital Status: Divorced    Spouse Name: N/A  . Number of Children: N/A  . Years of Education: N/A   Social History Main Topics  . Smoking status: Former Smoker -- 1.00 packs/day for 20 years    Quit date: 03/30/1996  . Smokeless tobacco: Not on file     Comment: quit w/dx laryngeal cancer 1997  . Alcohol Use: No  . Drug Use: No  . Sexual Activity: Not on file   Other Topics Concern  . Not on file   Social History Narrative    No family history on file.  Review of Systems  Constitutional: Positive for appetite change, fatigue and unexpected weight change. Negative for fever.  Respiratory: Negative for cough, shortness of breath and wheezing.  Cardiovascular: Positive for leg swelling (right foot and leg swell - x couple of months). Negative for chest pain and palpitations.  Gastrointestinal: Negative for nausea, abdominal pain and diarrhea.  Genitourinary: Negative for dysuria and hematuria.  Neurological: Negative for dizziness, light-headedness and headaches.  All other systems reviewed and are negative.      Objective:   Filed Vitals:   07/10/16 1321  BP: 124/62  Pulse: 87  Temp: 98.4 F (36.9 C)  Resp: 16   Filed Weights   07/10/16 1321  Weight: 82 lb (37.195 kg)   Body mass index is 16.01 kg/(m^2).   Physical Exam  Constitutional: No distress.  Thin, cachetic elderly female  HENT:  Head: Normocephalic and atraumatic.  Eyes: Conjunctivae are normal.  Neck: Neck  supple. No tracheal deviation present. No thyromegaly present.  Cardiovascular: Normal rate, regular rhythm and normal heart sounds.   No murmur heard. Pulmonary/Chest: Effort normal. No respiratory distress. She has no wheezes. She has no rales.  Abdominal: Soft. She exhibits mass (b/l pubic non-tender lumps - probable hernia - decrease is size when laying down). She exhibits no distension. There is no tenderness.  Musculoskeletal: She exhibits edema (mild).  Lymphadenopathy:    She has no cervical adenopathy.  Skin: Skin is warm and dry. She is not diaphoretic.  Psychiatric:  Mildly depressed affect          Assessment & Plan:     See Problem List for Assessment and Plan of chronic medical problems.   F/u in 4 weeks

## 2016-07-10 NOTE — Assessment & Plan Note (Signed)
Symptoms resolved, but left with residual anorexia causing weight loss

## 2016-07-16 ENCOUNTER — Telehealth: Payer: Self-pay

## 2016-07-16 NOTE — Telephone Encounter (Signed)
Please advise 

## 2016-07-16 NOTE — Telephone Encounter (Signed)
Patient is requesting something for her appetite. She is having a very hard time eating and would like to take something to help her. She states that you talked about this last time she was here. Please follow up thank you.

## 2016-07-18 ENCOUNTER — Telehealth: Payer: Self-pay | Admitting: Internal Medicine

## 2016-07-18 NOTE — Telephone Encounter (Signed)
Please give Kim Hardy a call t discuss abnormal circulation findings along with medication questions

## 2016-07-19 NOTE — Telephone Encounter (Signed)
Please advise 

## 2016-07-19 NOTE — Telephone Encounter (Signed)
We can do a trial of a medication for one month only - the only concern is that it may increase the risk of blood clots.  This would be a temporary medication only.  If she still wants to try it - send to pof

## 2016-07-21 NOTE — Telephone Encounter (Signed)
See below - does she still want to try it?

## 2016-07-23 MED ORDER — MEGESTROL ACETATE 400 MG/10ML PO SUSP: 400.0000 mg | Freq: Every day | ORAL | 0 refills | Status: DC

## 2016-07-23 NOTE — Telephone Encounter (Signed)
Spoke with [facility] nurse states the PAD test shows decreased circulation. States pt was interested in taking the medication to help with appetite. Has been sent to pharmacy.

## 2016-07-29 ENCOUNTER — Telehealth: Payer: Self-pay | Admitting: Emergency Medicine

## 2016-07-29 NOTE — Telephone Encounter (Signed)
Pharmacy called and the pt is on the BEERS list and its increased risk of cardiovascular events. Do you still want to dispense megestrol (MEGACE) 400 MG/10ML suspension to the pt. Please follow up thanks.

## 2016-07-29 NOTE — Telephone Encounter (Signed)
Spoke with pharmacy to inform MD would like to fill medication.

## 2016-08-12 ENCOUNTER — Encounter: Payer: Self-pay | Admitting: Internal Medicine

## 2016-08-12 ENCOUNTER — Ambulatory Visit (INDEPENDENT_AMBULATORY_CARE_PROVIDER_SITE_OTHER): Payer: Medicare Other | Admitting: Internal Medicine

## 2016-08-12 VITALS — BP 134/58 | HR 87 | Temp 98.2°F | Resp 16 | Ht 60.0 in | Wt 81.5 lb

## 2016-08-12 DIAGNOSIS — R634 Abnormal weight loss: Secondary | ICD-10-CM | POA: Diagnosis not present

## 2016-08-12 DIAGNOSIS — F329 Major depressive disorder, single episode, unspecified: Secondary | ICD-10-CM | POA: Diagnosis not present

## 2016-08-12 DIAGNOSIS — L602 Onychogryphosis: Secondary | ICD-10-CM

## 2016-08-12 DIAGNOSIS — L609 Nail disorder, unspecified: Secondary | ICD-10-CM | POA: Diagnosis not present

## 2016-08-12 DIAGNOSIS — F32A Depression, unspecified: Secondary | ICD-10-CM

## 2016-08-12 DIAGNOSIS — R630 Anorexia: Secondary | ICD-10-CM | POA: Diagnosis not present

## 2016-08-12 DIAGNOSIS — M79604 Pain in right leg: Secondary | ICD-10-CM

## 2016-08-12 NOTE — Assessment & Plan Note (Signed)
Refer to podiatry

## 2016-08-12 NOTE — Progress Notes (Signed)
Patient received education resource, including the self-management goal and tool. Patient verbalized understanding. 

## 2016-08-12 NOTE — Assessment & Plan Note (Signed)
Impression improved Continue sertraline at current dose

## 2016-08-12 NOTE — Assessment & Plan Note (Signed)
Has pain with walking, which started recently Nurse visit from insurance preliminary test for PAD was positive Will order ABI May need vascular surgery referral

## 2016-08-12 NOTE — Assessment & Plan Note (Signed)
Appetite improved She feels the improved appetite started before starting Megace ? Related to sertraline Continue current medications, will plan to discontinue Megace after one month depending on weight in one month Continue small frequent meals

## 2016-08-12 NOTE — Progress Notes (Signed)
Subjective:    Patient ID: Kim Hardy, female    DOB: [birth year], 82 y.o.   MRN: [id number]  HPI She is here for follow up.  Decreased appetite, weight loss:  Her appetite is Improving. She recently started the Megace, but before starting the medication started to notice an increase in her appetite. She feels she has been eating more and more frequently. Last night she woke up in the middle of the night and was hungry and ate something, but only ate 4 crackers. She was surprised to see her weight is down by 1 pound-she was expecting an increase. She does feel like things are getting better. She is drinking boost.  Insomnia, depression:  She is sleeping better with the zoloft.  She is sometimes only taking half of a pill and then sometimes taking another half-year-old the night if needed. She denies any side effects. She feels her depression is improved.  ? PAD:  She does have some discomfort in her right leg when walking.  This started recently. A nurse from her insurance came to her home to visit a preliminary test for peripheral arterial disease was positive. Further evaluation was recommended.  Thickened toenails: She is interested in seeing a podiatrist because she does have thickened toenails.  Medications and allergies reviewed with patient and updated if appropriate.  Patient Active Problem List   Diagnosis Date Noted  . Anorexia 07/10/2016  . Loss of weight 07/10/2016  . Depression 07/10/2016  . Hypertriglyceridemia 03/08/2016  . Diverticulitis of colon 03/08/2016  . Cancer of lower lobe of lung ([place]) 01/31/2012  . laryngeal ca   . Lung cancer [facility])     Current Outpatient Prescriptions on File Prior to Visit  Medication Sig Dispense Refill  . dorzolamide-timolol (COSOPT) 22.3-6.8 MG/ML ophthalmic solution INSTILL 1 DROP INTO RIGHT EYE TWICE A DAY  2  . fenofibrate 160 MG tablet Take 1 tablet (160 mg total) by mouth daily. 90 tablet 3  . lactose free nutrition (BOOST)  LIQD Take 237 mLs by mouth 3 (three) times daily between meals.    . megestrol (MEGACE) 400 MG/10ML suspension Take 10 mLs (400 mg total) by mouth daily. 240 mL 0  . OVER THE COUNTER MEDICATION Place 1 drop into both eyes 2 (two) times daily as needed (dry eyes).     . promethazine (PHENERGAN) 25 MG tablet Take 1 tablet (25 mg total) by mouth every 8 (eight) hours as needed for nausea or vomiting. 30 tablet 0  . sertraline (ZOLOFT) 25 MG tablet Take 1 tablet (25 mg total) by mouth daily. 30 tablet 5   No current facility-administered medications on file prior to visit.     Past Medical History:  Diagnosis Date  . Allergy    sulfa-hives, boniva=tremors,statins=n/v  . Diverticulosis 04/22/16   ct abd/pelvis  . History of radiation therapy 04/01/2011- 4/11/212   right lung  lower lobe  . Hypercholesterolemia   . laryngeal ca dx'd 1997   surg only  . Lung cancer [facility]) dx'd 01-2011   xrt comp 03/2011    Past Surgical History:  Procedure Laterality Date  . LARYNGECTOMY  1997  . TRACHEOSTOMY    . VOICE PROSTHESIS      Social History   Social History  . Marital status: Divorced    Spouse name: N/A  . Number of children: N/A  . Years of education: N/A   Social History Main Topics  . Smoking status: Former Smoker  Packs/day: 1.00    Years: 20.00    Quit date: 03/30/1996  . Smokeless tobacco: Not on file     Comment: quit w/dx laryngeal cancer 1997  . Alcohol use No  . Drug use: No  . Sexual activity: Not on file   Other Topics Concern  . Not on file   Social History Narrative  . No narrative on file    No family history on file.  Review of Systems  Constitutional: Negative for chills and fever.  Respiratory: Negative for cough, shortness of breath and wheezing.   Cardiovascular: Negative for chest pain, palpitations and leg swelling.  Gastrointestinal: Negative for abdominal pain.  Neurological: Negative for light-headedness and headaches.       Objective:    Vitals:   08/12/16 1431  BP: (!) 134/58  Pulse: 87  Resp: 16  Temp: 98.2 F (36.8 C)   Filed Weights   08/12/16 1431  Weight: 81 lb 8 oz (37 kg)   Body mass index is 15.92 kg/m.   Physical Exam Constitutional: Appears well-developed and well-nourished. No distress.  HENT:  Head: Normocephalic and atraumatic.  Neck: Neck supple. No tracheal deviation present. No thyromegaly present.  Cardiovascular: Normal rate, regular rhythm and normal heart sounds.   No murmur heard.   Pulmonary/Chest: Effort normal and breath sounds normal. No respiratory distress. No has no wheezes. No rales.  Musculoskeletal:Mild right lower ankle edema.  Lymphadenopathy: No cervical adenopathy.  Skin: Skin is warm and dry. Not diaphoretic.   Psychiatric: Normal mood and affect. Behavior is normal.         Assessment & Plan:   See Problem List for Assessment and Plan of chronic medical problems.

## 2016-08-12 NOTE — Patient Instructions (Addendum)
   Medications reviewed and updated.  No changes recommended at this time.  Your prescription(s) have been submitted to your pharmacy. Please take as directed and contact our office if you believe you are having problem(s) with the medication(s).  A referral was ordered for podiatry.    A test was ordered to assess your leg circulation.   Please followup in 1 month

## 2016-08-12 NOTE — Assessment & Plan Note (Signed)
Wt Readings from Last 3 Encounters:  08/12/16 81 lb 8 oz (37 kg)  07/10/16 82 lb (37.2 kg)  04/24/16 85 lb (38.6 kg)   1 pound weight loss in last month, but her appetite has improved and she feels over the past few days especially she has been eating more Continue to monitor closely Continue Megace-we'll continue this only for one month and then will discontinue Continue sertraline Follow-up in 4 weeks, sooner if needed

## 2016-09-09 ENCOUNTER — Other Ambulatory Visit: Payer: Self-pay | Admitting: Internal Medicine

## 2016-09-09 DIAGNOSIS — M79604 Pain in right leg: Secondary | ICD-10-CM

## 2016-09-11 ENCOUNTER — Ambulatory Visit (INDEPENDENT_AMBULATORY_CARE_PROVIDER_SITE_OTHER): Payer: Medicare Other | Admitting: Internal Medicine

## 2016-09-11 ENCOUNTER — Encounter: Payer: Self-pay | Admitting: Internal Medicine

## 2016-09-11 VITALS — BP 136/68 | HR 87 | Temp 98.5°F | Resp 16 | Wt 84.0 lb

## 2016-09-11 DIAGNOSIS — R630 Anorexia: Secondary | ICD-10-CM

## 2016-09-11 DIAGNOSIS — F329 Major depressive disorder, single episode, unspecified: Secondary | ICD-10-CM

## 2016-09-11 DIAGNOSIS — R634 Abnormal weight loss: Secondary | ICD-10-CM | POA: Diagnosis not present

## 2016-09-11 DIAGNOSIS — R200 Anesthesia of skin: Secondary | ICD-10-CM | POA: Insufficient documentation

## 2016-09-11 DIAGNOSIS — Z23 Encounter for immunization: Secondary | ICD-10-CM

## 2016-09-11 DIAGNOSIS — R208 Other disturbances of skin sensation: Secondary | ICD-10-CM

## 2016-09-11 DIAGNOSIS — F32A Depression, unspecified: Secondary | ICD-10-CM

## 2016-09-11 MED ORDER — PROMETHAZINE HCL 25 MG PO TABS: 25.0000 mg | ORAL_TABLET | Freq: Three times a day (TID) | ORAL | 0 refills | Status: DC | PRN

## 2016-09-11 MED ORDER — MEGESTROL ACETATE 400 MG/10ML PO SUSP
400.0000 mg | Freq: Every day | ORAL | 1 refills | Status: DC
Start: 2016-09-11 — End: 2016-11-11

## 2016-09-11 NOTE — Assessment & Plan Note (Signed)
Mild, intermittent and not bothering her Will monitor for now and do further testing if it persists, worsens

## 2016-09-11 NOTE — Assessment & Plan Note (Signed)
Due to decreased oral intake from anorexia Megace has helped a little - no side effects - will continue to two more months and then hopefully her appetite will have been improved and she will no longer needed Has gained 3 lbs in past month Work up for other causes of weight loss have been negative. Phenergan prn for nausea Follow up in 2 months

## 2016-09-11 NOTE — Patient Instructions (Signed)
  Continue the megace and use phenergan if needed for nausea.   Flu vaccine administered today.   Medications reviewed and updated.  No changes recommended at this time.  Your prescription(s) have been submitted to your pharmacy. Please take as directed and contact our office if you believe you are having problem(s) with the medication(s).   Please followup in 2 months

## 2016-09-11 NOTE — Progress Notes (Signed)
Pre visit review using our clinic review tool, if applicable. No additional management support is needed unless otherwise documented below in the visit note. 

## 2016-09-11 NOTE — Assessment & Plan Note (Addendum)
Sertraline may have improved the insomnia which has made her feel better Controlled, stable Continue current dose of medication

## 2016-09-11 NOTE — Progress Notes (Signed)
Subjective:    Patient ID: Kim Hardy, female    DOB: 07/04/1931, 82 y.o.   MRN: [id number]  HPI The patient is here for follow up.  Her appetite has improved just a little.  She thinks the megace has helped a little.  She denies side effects from the megace.  She completed it yesterday.  She does have ocassion nausea.   She denies abdominal pain.  Her bowel movements are normal if she eats.  She has gained three pounds since she was here last.  She continue to drink the boost daily and tries to eat.    Difficulty sleeping, depression:  She is taking the sertraline nightly.  She is sleeping better.  She is unsure if it is helping with her depression.   She has noticed numbness in her hands at times - it is intermittent.  The other day her right hand was weak.  She denies right wrist, elbow, shoulder or neck pain.   Medications and allergies reviewed with patient and updated if appropriate.  Patient Active Problem List   Diagnosis Date Noted  . Right leg pain 08/12/2016  . Hypertrophic toenail 08/12/2016  . Anorexia 07/10/2016  . Loss of weight 07/10/2016  . Depression 07/10/2016  . Hypertriglyceridemia 03/08/2016  . Diverticulitis of colon 03/08/2016  . laryngeal ca   . Lung cancer [facility])     Current Outpatient Prescriptions on File Prior to Visit  Medication Sig Dispense Refill  . dorzolamide-timolol (COSOPT) 22.3-6.8 MG/ML ophthalmic solution INSTILL 1 DROP INTO RIGHT EYE TWICE A DAY  2  . fenofibrate 160 MG tablet Take 1 tablet (160 mg total) by mouth daily. 90 tablet 3  . lactose free nutrition (BOOST) LIQD Take 237 mLs by mouth 3 (three) times daily between meals.    . megestrol (MEGACE) 400 MG/10ML suspension Take 10 mLs (400 mg total) by mouth daily. 240 mL 0  . OVER THE COUNTER MEDICATION Place 1 drop into both eyes 2 (two) times daily as needed (dry eyes).     . sertraline (ZOLOFT) 25 MG tablet Take 1 tablet (25 mg total) by mouth daily. 30 tablet 5   No current  facility-administered medications on file prior to visit.     Past Medical History:  Diagnosis Date  . Allergy    sulfa-hives, boniva=tremors,statins=n/v  . Diverticulosis 04/22/16   ct abd/pelvis  . History of radiation therapy 04/01/2011- 4/11/212   right lung  lower lobe  . Hypercholesterolemia   . laryngeal ca dx'd 1997   surg only  . Lung cancer [facility]) dx'd 01-2011   xrt comp 03/2011    Past Surgical History:  Procedure Laterality Date  . LARYNGECTOMY  1997  . TRACHEOSTOMY    . VOICE PROSTHESIS      Social History   Social History  . Marital status: Divorced    Spouse name: N/A  . Number of children: N/A  . Years of education: N/A   Social History Main Topics  . Smoking status: Former Smoker    Packs/day: 1.00    Years: 20.00    Quit date: 03/30/1996  . Smokeless tobacco: Not on file     Comment: quit w/dx laryngeal cancer 1997  . Alcohol use No  . Drug use: No  . Sexual activity: Not on file   Other Topics Concern  . Not on file   Social History Narrative  . No narrative on file    No family history on file.  Review of Systems  Constitutional: Positive for appetite change. Negative for fever.  Respiratory: Negative for cough, shortness of breath and wheezing.   Cardiovascular: Negative for chest pain and palpitations.  Gastrointestinal: Positive for nausea (ocassional). Negative for abdominal pain, constipation and diarrhea.  Musculoskeletal: Negative for neck pain.       No arm pain  Neurological: Positive for weakness (right hand) and numbness (b/l hands). Negative for headaches.       Objective:   Vitals:   09/11/16 1346  BP: 136/68  Pulse: 87  Resp: 16  Temp: 98.5 F (36.9 C)   Filed Weights   09/11/16 1346  Weight: 84 lb (38.1 kg)   Body mass index is 16.41 kg/m.   Physical Exam    Constitutional: chronically ill appearing, frail. No distress.  Musculoskeletal: No edema.  no deformity of hands Neuro: normal sensation in hands,  warm to touch  Skin: Skin is warm and dry. Not diaphoretic.  Psychiatric: Normal mood and affect. Behavior is normal.     Assessment & Plan:    See Problem List for Assessment and Plan of chronic medical problems.   F/u in 2 months

## 2016-09-11 NOTE — Assessment & Plan Note (Signed)
Phenergan prn for nausea Will continue megace to increase appetite - benefits outweigh risks.  We have discussed risks Continue for two more months and then hope to discontinue

## 2016-09-18 ENCOUNTER — Encounter: Payer: Self-pay | Admitting: Podiatry

## 2016-09-18 ENCOUNTER — Ambulatory Visit (INDEPENDENT_AMBULATORY_CARE_PROVIDER_SITE_OTHER): Payer: Medicare Other | Admitting: Podiatry

## 2016-09-18 VITALS — BP 133/62 | HR 93 | Resp 18

## 2016-09-18 DIAGNOSIS — M79676 Pain in unspecified toe(s): Secondary | ICD-10-CM | POA: Diagnosis not present

## 2016-09-18 DIAGNOSIS — B351 Tinea unguium: Secondary | ICD-10-CM | POA: Diagnosis not present

## 2016-09-18 NOTE — Progress Notes (Signed)
   Subjective:    Patient ID: Kim Hardy, female    DOB: 1931-09-28, 82 y.o.   MRN: [id number]  HPI    This patient presents today complaining of approximately 2 year history of gradually thickening and the form toenails which she says are extremely hard to trim, however, denies any pain in the toenails. 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       Objective:   Physical Exam  Orientated 3  Vascular: No calf edema or calf tenderness bilaterally Trace palpable DP pulses bilaterally Trace palpable PT pulses bilaterally Capillary reflex immediate bilaterally  Neurological: Sensation to 10 g monofilament wire intact 5/5 bilaterally Vibratory sensation reactive bilaterally Ankle reflex equal and reactive bilaterally   Dermatological: No open skin lesions bilaterally Atrophic skin bilaterally The toenails are extremely elongated, deformed, hypertrophic 6-10 Maximum deformity and hallux toenails with hypertrophy one-inch  Musculoskeletal: There is no restriction ankle, subtalar, midtarsal joints bilaterally Dorsi flexion, plantar flexion 5/5 bilaterally         Assessment & Plan:   Assessment: Decreased pedal pulses bilaterally suggestive peripheral arterial disease Extremely neglected symptomatic onychomycoses 6-10  Plan: Today I offered patient debridement of the toenails and she verbally consents The toenails 6-10 were debrided mechanically and electronically. Slight bleeding distal fifth right and first left toe treated with antibiotic ointment and Band-Aids. Patient instructed to remove Band-Aids 1-3 days and to continue apply topical antibiotic ointment and Band-Aids daily until a scab forms   Reappoint 3 months

## 2016-09-18 NOTE — Patient Instructions (Signed)
After debridement of extremely thickened toenails today there was slight bleeding on the fifth right toe and first left toe treated with antibiotic ointment and Band-Aid. Removed Band-Aid 1-3 days and apply small amount topical antibiotic ointment and a Band-Aid daily until a scab forms

## 2016-09-19 ENCOUNTER — Ambulatory Visit (HOSPITAL_COMMUNITY)
Admission: RE | Admit: 2016-09-19 | Discharge: 2016-09-19 | Disposition: A | Discharge status: Home / Self Care | Payer: Medicare Other | Source: Ambulatory Visit | Attending: Cardiology | Admitting: Cardiology

## 2016-09-19 DIAGNOSIS — Z87891 Personal history of nicotine dependence: Secondary | ICD-10-CM | POA: Insufficient documentation

## 2016-09-19 DIAGNOSIS — I708 Atherosclerosis of other arteries: Secondary | ICD-10-CM | POA: Insufficient documentation

## 2016-09-19 DIAGNOSIS — M79604 Pain in right leg: Secondary | ICD-10-CM | POA: Diagnosis not present

## 2016-09-19 DIAGNOSIS — I700 Atherosclerosis of aorta: Secondary | ICD-10-CM | POA: Diagnosis not present

## 2016-09-19 DIAGNOSIS — I739 Peripheral vascular disease, unspecified: Secondary | ICD-10-CM | POA: Diagnosis not present

## 2016-09-19 DIAGNOSIS — E785 Hyperlipidemia, unspecified: Secondary | ICD-10-CM | POA: Diagnosis not present

## 2016-09-19 DIAGNOSIS — R938 Abnormal findings on diagnostic imaging of other specified body structures: Secondary | ICD-10-CM | POA: Diagnosis not present

## 2016-09-20 ENCOUNTER — Encounter: Payer: Self-pay | Admitting: Internal Medicine

## 2016-09-20 DIAGNOSIS — I739 Peripheral vascular disease, unspecified: Secondary | ICD-10-CM | POA: Insufficient documentation

## 2016-09-24 ENCOUNTER — Other Ambulatory Visit: Payer: Self-pay | Admitting: Internal Medicine

## 2016-09-24 DIAGNOSIS — I739 Peripheral vascular disease, unspecified: Secondary | ICD-10-CM

## 2016-10-04 ENCOUNTER — Encounter: Payer: Self-pay | Admitting: Vascular Surgery

## 2016-10-09 ENCOUNTER — Encounter: Payer: Self-pay | Admitting: Vascular Surgery

## 2016-10-09 ENCOUNTER — Ambulatory Visit (INDEPENDENT_AMBULATORY_CARE_PROVIDER_SITE_OTHER): Payer: Medicare Other | Admitting: Vascular Surgery

## 2016-10-09 VITALS — BP 125/63 | HR 85 | Temp 97.7°F | Resp 28 | Ht 60.0 in | Wt 86.4 lb

## 2016-10-09 DIAGNOSIS — I739 Peripheral vascular disease, unspecified: Secondary | ICD-10-CM | POA: Diagnosis not present

## 2016-10-09 NOTE — Addendum Note (Signed)
Addended by: Kaleen Mask on: 10/09/2016 04:53 PM   Modules accepted: Orders

## 2016-10-09 NOTE — Progress Notes (Signed)
Patient ID: Kim Hardy, female   DOB: July 24, 1931, 82 y.o.   MRN: [id number]  Reason for Consult: New Evaluation (eval pvd - ref by Dr. Quay Burow - c/o pain and swelling in R LE X 7-8 months)   Referred by [name], MD  Subjective:     HPI:  Kim Hardy is a 82 y.o. female with history of laryngeal cancer status post laryngectomy 20 years ago. At that time she had been dedicated smoker but now no longer. She presents with several month history of right lower extremity pain. The pain is brought on with any ambulation. It is mostly focused in her anterior thigh sometimes drain down her anterior lower leg. She denies any calf cramping. She does not have pain at rest. She does not have tissue loss however has recently undergone a toenail clipping of her left great toe that is in the process of healing and cut herself on her right PT toe that is also been slow to heal. She has never had any other issues with healing in her feet. She does not take aspirin she does take fibroids and cannot take statins due to GI intolerance. She is never had a stroke does not have known coronary history.  Past Medical History:  Diagnosis Date  . Allergy    sulfa-hives, boniva=tremors,statins=n/v  . Diverticulosis 04/22/16   ct abd/pelvis  . History of radiation therapy 04/01/2011- 4/11/212   right lung  lower lobe  . Hypercholesterolemia   . laryngeal ca dx'd 1997   surg only  . Lung cancer ([place]) dx'd 01-2011   xrt comp 03/2011  . Peripheral vascular disease ([place])    No family history on file. Past Surgical History:  Procedure Laterality Date  . LARYNGECTOMY  1997  . TRACHEOSTOMY    . VOICE PROSTHESIS      Short Social History:  Social History  Substance Use Topics  . Smoking status: Former Smoker    Packs/day: 1.00    Years: 20.00    Quit date: 03/30/1996  . Smokeless tobacco: Not on file     Comment: quit w/dx laryngeal cancer 1997  . Alcohol use No    Allergies  Allergen Reactions  .  Anesthetics, Amide Nausea And Vomiting  . Boniva [Ibandronate Sodium] Other (See Comments)    tremors  . Ciprofloxacin     "I get very sick"  . Levofloxacin     Pt states she is unable to take this.   . Metronidazole     Pt states she is unable to tolerate.   . Statins Nausea And Vomiting  . Sulfa Antibiotics Hives    Current Outpatient Prescriptions  Medication Sig Dispense Refill  . dorzolamide-timolol (COSOPT) 22.3-6.8 MG/ML ophthalmic solution INSTILL 1 DROP INTO RIGHT EYE TWICE A DAY  2  . fenofibrate 160 MG tablet Take 1 tablet (160 mg total) by mouth daily. 90 tablet 3  . lactose free nutrition (BOOST) LIQD Take 237 mLs by mouth 3 (three) times daily between meals.    . megestrol (MEGACE) 400 MG/10ML suspension Take 10 mLs (400 mg total) by mouth daily. 240 mL 1  . OVER THE COUNTER MEDICATION Place 1 drop into both eyes 2 (two) times daily as needed (dry eyes).     . promethazine (PHENERGAN) 25 MG tablet Take 1 tablet (25 mg total) by mouth every 8 (eight) hours as needed for nausea or vomiting. 30 tablet 0  . sertraline (ZOLOFT) 25 MG tablet Take 1  tablet (25 mg total) by mouth daily. 30 tablet 5   No current facility-administered medications for this visit.     Review of Systems  Constitutional:  Constitutional negative. Eyes: Eyes negative.  Respiratory: Respiratory negative.  Cardiovascular: Positive for leg swelling.  GI: Gastrointestinal negative.  Musculoskeletal: Positive for gait problem, leg pain and joint pain.  Skin: Skin negative.  Hematologic: Hematologic/lymphatic negative.  Psychiatric: Psychiatric negative.        Objective:  Objective   Vitals:   10/09/16 1347  BP: 125/63  Pulse: 85  Resp: (!) 28  Temp: 97.7 F (36.5 C)  TempSrc: Oral  SpO2: 100%  Weight: 86 lb 6.4 oz (39.2 kg)  Height: 5' (1.524 m)   Body mass index is 16.87 kg/m.  Physical Exam  Constitutional: She is oriented to person, place, and time.  undernourished  HENT:    Laryngeal stoma  Eyes: EOM are normal.  Neck: Normal range of motion.  Cardiovascular: Normal rate.   Pulses:      Carotid pulses are 2+ on the right side, and 2+ on the left side.      Radial pulses are 2+ on the right side, and 0 on the left side.       Femoral pulses are 2+ on the right side, and 2+ on the left side.      Popliteal pulses are 1+ on the right side, and 1+ on the left side.       Dorsalis pedis pulses are 0 on the right side, and 0 on the left side.       Posterior tibial pulses are 0 on the right side, and 0 on the left side.  Monophasic bilateral dp/pt  Pulmonary/Chest: Effort normal.  Abdominal: Soft. She exhibits no mass.  Musculoskeletal: Normal range of motion. She exhibits no edema.  Lymphadenopathy:    She has no cervical adenopathy.  Neurological: She is alert and oriented to person, place, and time.  Skin: Skin is warm and dry.  Psychiatric: She has a normal mood and affect. Her behavior is normal. Judgment and thought content normal.    Data: Bilateral lower 70 duplexes at [facility] demonstrate that the percent stenosis of the bilateral common and external iliac arteries. She has a 50-74% stenosis in the right distal and left mid SFA with three-vessel runoff bilaterally. ABI on right 0.65 and on left 0.53.     Assessment/Plan:    82 year old white female history of laryngeal cancer now with right lower extremity pain duplex and ABIs demonstrating lower extremity vascular disease. She does not have wounds at this time her pain seems nonvascular in origin. I have recommended to her to add aspirin to her medication regimen. She should continue walking as tolerated. I will see her in 6 months with repeat duplex and ABIs and if she is no better perhaps we could discuss aortogram with bilateral lower extremity runoff and possible intervention. All of her questions were answered at today's visit.     Kim Sandy MD [facility]

## 2016-10-09 NOTE — Progress Notes (Signed)
respiratory

## 2016-10-11 ENCOUNTER — Other Ambulatory Visit: Payer: Self-pay | Admitting: Vascular Surgery

## 2016-10-11 DIAGNOSIS — I739 Peripheral vascular disease, unspecified: Secondary | ICD-10-CM

## 2016-11-10 MED ORDER — ASPIRIN EC 81 MG PO TBEC: 81.0000 mg | DELAYED_RELEASE_TABLET | Freq: Every day | ORAL | Status: AC

## 2016-11-10 NOTE — Progress Notes (Signed)
Subjective:    Patient ID: Kim Hardy, female    DOB: 1931/02/23, 82 y.o.   MRN: [id number]  HPI The patient is here for follow up.  Anorexia, early satiety, weight loss:  She was taking the megace and ran out three weeks ago.  She denies side effects from the medication..  She states decreased appetite, early satiety and nothing tastes good.  She does not weigh herself at home.  She has lost 6 lbs since she was here last.   Difficulty sleeping, depression:  She is taking sertraline nightly.  She feels her sleep is good.  She denies depression except for not feeling good.   Medications and allergies reviewed with patient and updated if appropriate.  Patient Active Problem List   Diagnosis Date Noted  . PVD (peripheral vascular disease) ([place]) 09/20/2016  . Bilateral hand numbness 09/11/2016  . Right leg pain 08/12/2016  . Hypertrophic toenail 08/12/2016  . Anorexia 07/10/2016  . Loss of weight 07/10/2016  . Depression 07/10/2016  . Hypertriglyceridemia 03/08/2016  . Diverticulitis of colon 03/08/2016  . laryngeal ca   . Lung cancer [facility])     Current Outpatient Prescriptions on File Prior to Visit  Medication Sig Dispense Refill  . dorzolamide-timolol (COSOPT) 22.3-6.8 MG/ML ophthalmic solution INSTILL 1 DROP INTO RIGHT EYE TWICE A DAY  2  . fenofibrate 160 MG tablet Take 1 tablet (160 mg total) by mouth daily. 90 tablet 3  . lactose free nutrition (BOOST) LIQD Take 237 mLs by mouth 3 (three) times daily between meals.    Marland Kitchen OVER THE COUNTER MEDICATION Place 1 drop into both eyes 2 (two) times daily as needed (dry eyes).     . promethazine (PHENERGAN) 25 MG tablet Take 1 tablet (25 mg total) by mouth every 8 (eight) hours as needed for nausea or vomiting. 30 tablet 0  . sertraline (ZOLOFT) 25 MG tablet Take 1 tablet (25 mg total) by mouth daily. 30 tablet 5   No current facility-administered medications on file prior to visit.     Past Medical History:  Diagnosis Date  .  Allergy    sulfa-hives, boniva=tremors,statins=n/v  . Diverticulosis 04/22/16   ct abd/pelvis  . History of radiation therapy 04/01/2011- 4/11/212   right lung  lower lobe  . Hypercholesterolemia   . laryngeal ca dx'd 1997   surg only  . Lung cancer ([place]) dx'd 01-2011   xrt comp 03/2011  . Peripheral vascular disease [facility])     Past Surgical History:  Procedure Laterality Date  . LARYNGECTOMY  1997  . TRACHEOSTOMY    . VOICE PROSTHESIS      Social History   Social History  . Marital status: Divorced    Spouse name: N/A  . Number of children: N/A  . Years of education: N/A   Social History Main Topics  . Smoking status: Former Smoker    Packs/day: 1.00    Years: 20.00    Quit date: 03/30/1996  . Smokeless tobacco: Not on file     Comment: quit w/dx laryngeal cancer 1997  . Alcohol use No  . Drug use: No  . Sexual activity: Not on file   Other Topics Concern  . Not on file   Social History Narrative  . No narrative on file    No family history on file.  Review of Systems  Constitutional: Positive for appetite change and unexpected weight change. Negative for fever.  HENT: Negative for trouble swallowing.  Respiratory: Negative for shortness of breath.   Cardiovascular: Negative for chest pain, palpitations and leg swelling.  Gastrointestinal: Negative for abdominal pain, blood in stool (no black stool), constipation and diarrhea.  Neurological: Negative for light-headedness and headaches.       Objective:   Vitals:   11/11/16 1337  BP: (!) 112/54  Resp: 16  Temp: 98.1 F (36.7 C)   Filed Weights   11/11/16 1337  Weight: 80 lb (36.3 kg)   Body mass index is 15.62 kg/m.   Physical Exam    Constitutional: frail, thin, cachetic appearing. No distress.  HENT:  Head: Normocephalic and atraumatic.  Neck: tracheostomy,  No tracheal deviation present. No thyromegaly present.  No cervical lymphadenopathy Cardiovascular: Normal rate, regular rhythm and  normal heart sounds.   No murmur heard. No carotid bruit .  No edema Pulmonary/Chest: Effort normal and breath sounds normal. No respiratory distress. No has no wheezes. No rales.  Abdomen: soft, non tender, non distended Skin: Skin is warm and dry. Not diaphoretic.  Psychiatric: Normal mood and affect. Behavior is normal.      Assessment & Plan:    See Problem List for Assessment and Plan of chronic medical problems.

## 2016-11-10 NOTE — Assessment & Plan Note (Signed)
Following with vascular Started ASA 81 mg daily Will follow up with them to re-evaluate

## 2016-11-11 ENCOUNTER — Ambulatory Visit (INDEPENDENT_AMBULATORY_CARE_PROVIDER_SITE_OTHER): Payer: Medicare Other | Admitting: Internal Medicine

## 2016-11-11 ENCOUNTER — Encounter: Payer: Self-pay | Admitting: Internal Medicine

## 2016-11-11 VITALS — BP 112/54 | Temp 98.1°F | Resp 16 | Wt 80.0 lb

## 2016-11-11 DIAGNOSIS — R630 Anorexia: Secondary | ICD-10-CM | POA: Diagnosis not present

## 2016-11-11 DIAGNOSIS — F329 Major depressive disorder, single episode, unspecified: Secondary | ICD-10-CM

## 2016-11-11 DIAGNOSIS — R634 Abnormal weight loss: Secondary | ICD-10-CM

## 2016-11-11 DIAGNOSIS — F32A Depression, unspecified: Secondary | ICD-10-CM

## 2016-11-11 MED ORDER — MEGESTROL ACETATE 400 MG/10ML PO SUSP: 400.0000 mg | Freq: Every day | ORAL | 5 refills | Status: DC

## 2016-11-11 NOTE — Progress Notes (Signed)
Pre visit review using our clinic review tool, if applicable. No additional management support is needed unless otherwise documented below in the visit note. 

## 2016-11-11 NOTE — Patient Instructions (Signed)
   Medications reviewed and updated.  Changes include restarting the megace.  Your prescription(s) have been submitted to your pharmacy. Please take as directed and contact our office if you believe you are having problem(s) with the medication(s).   Please followup in 2 months

## 2016-11-11 NOTE — Assessment & Plan Note (Signed)
Depression and sleep:  Controlled, stable Continue current dose of medication

## 2016-11-11 NOTE — Assessment & Plan Note (Addendum)
Wt Readings from Last 3 Encounters:  11/11/16 80 lb (36.3 kg)  10/09/16 86 lb 6.4 oz (39.2 kg)  09/11/16 84 lb (38.1 kg)   Restart megace - continue for now long term Will f/u with Dr [name] - no obvious other cause of weight loss

## 2016-11-11 NOTE — Assessment & Plan Note (Addendum)
Decreased significantly - megace helps - has lost 6lbs since being off of it in the past 3 weeks Restart Will follow up with Dr [name] for his opinion Deferred nutrition and GI referrals at this time - symptoms started after diverticulitis early last year

## 2016-12-18 ENCOUNTER — Ambulatory Visit: Payer: Medicare Other | Admitting: Podiatry

## 2017-01-12 NOTE — Progress Notes (Signed)
Subjective:    Patient ID: Kim Hardy, female    DOB: February 20, 1931, 83 y.o.   MRN: [id number]  HPI The patient is here for follow up.  Anorexia, early satiety, nausea, weight loss:  She is taking megace.  She denies side effects. She denies nausea.  She denies abdominal pain, constipation.  She feels better.  She thought she would have weighed more today than 85 because she has been eating well.  She continues to drink ensure daily.    Wt Readings from Last 3 Encounters:  01/13/17 85 lb (38.6 kg)  11/11/16 80 lb (36.3 kg)  10/09/16 86 lb 6.4 oz (39.2 kg)    Depression, difficulty sleeping:  She is taking sertraline as prescribed.  She is taking one full pill at night.  She denies depression and her sleep is good.    Hypertriglyceridemia: She is taking her medication daily. She is compliant with a low fat/cholesterol diet.     Medications and allergies reviewed with patient and updated if appropriate.  Patient Active Problem List   Diagnosis Date Noted  . PVD (peripheral vascular disease) ([place]) 09/20/2016  . Bilateral hand numbness 09/11/2016  . Right leg pain 08/12/2016  . Hypertrophic toenail 08/12/2016  . Anorexia 07/10/2016  . Loss of weight 07/10/2016  . Depression 07/10/2016  . Hypertriglyceridemia 03/08/2016  . Diverticulitis of colon 03/08/2016  . laryngeal ca   . Lung cancer [facility])     Current Outpatient Prescriptions on File Prior to Visit  Medication Sig Dispense Refill  . aspirin EC 81 MG tablet Take 1 tablet (81 mg total) by mouth daily.    . dorzolamide-timolol (COSOPT) 22.3-6.8 MG/ML ophthalmic solution INSTILL 1 DROP INTO RIGHT EYE TWICE A DAY  2  . fenofibrate 160 MG tablet Take 1 tablet (160 mg total) by mouth daily. 90 tablet 3  . lactose free nutrition (BOOST) LIQD Take 237 mLs by mouth 3 (three) times daily between meals.    . megestrol (MEGACE) 400 MG/10ML suspension Take 10 mLs (400 mg total) by mouth daily. 240 mL 5  . OVER THE COUNTER MEDICATION  Place 1 drop into both eyes 2 (two) times daily as needed (dry eyes).     . promethazine (PHENERGAN) 25 MG tablet Take 1 tablet (25 mg total) by mouth every 8 (eight) hours as needed for nausea or vomiting. 30 tablet 0  . sertraline (ZOLOFT) 25 MG tablet Take 1 tablet (25 mg total) by mouth daily. 30 tablet 5   No current facility-administered medications on file prior to visit.     Past Medical History:  Diagnosis Date  . Allergy    sulfa-hives, boniva=tremors,statins=n/v  . Diverticulosis 04/22/16   ct abd/pelvis  . History of radiation therapy 04/01/2011- 4/11/212   right lung  lower lobe  . Hypercholesterolemia   . laryngeal ca dx'd 1997   surg only  . Lung cancer ([place]) dx'd 01-2011   xrt comp 03/2011  . Peripheral vascular disease [facility])     Past Surgical History:  Procedure Laterality Date  . LARYNGECTOMY  1997  . TRACHEOSTOMY    . VOICE PROSTHESIS      Social History   Social History  . Marital status: Divorced    Spouse name: N/A  . Number of children: N/A  . Years of education: N/A   Social History Main Topics  . Smoking status: Former Smoker    Packs/day: 1.00    Years: 20.00    Quit date: 03/30/1996  .  Smokeless tobacco: Not on file     Comment: quit w/dx laryngeal cancer 1997  . Alcohol use No  . Drug use: No  . Sexual activity: Not on file   Other Topics Concern  . Not on file   Social History Narrative  . No narrative on file    No family history on file.  Review of Systems  Constitutional: Negative for fever.  Respiratory: Negative for cough and wheezing.   Cardiovascular: Negative for chest pain, palpitations and leg swelling.  Gastrointestinal: Negative for abdominal pain and constipation.  Neurological: Negative for light-headedness and headaches.       Objective:   Vitals:   01/13/17 1337  BP: (!) 142/70  Pulse: 86  Resp: 16  Temp: 98 F (36.7 C)   Wt Readings from Last 3 Encounters:  01/13/17 85 lb (38.6 kg)  11/11/16 80 lb  (36.3 kg)  10/09/16 86 lb 6.4 oz (39.2 kg)   Body mass index is 16.6 kg/m.   Physical Exam    Constitutional: Appears well-developed and well-nourished. No distress.  HENT:  Head: Normocephalic and atraumatic.  Neck: Trach Cardiovascular: Normal rate, regular rhythm and normal heart sounds.   No murmur heard. No carotid bruit .  No edema Pulmonary/Chest: Effort normal and breath sounds normal. No respiratory distress. No has no wheezes. No rales.  Skin: Skin is warm and dry. Not diaphoretic.  Psychiatric: Normal mood and affect. Behavior is normal.      Assessment & Plan:    See Problem List for Assessment and Plan of chronic medical problems.   FU in 3 months

## 2017-01-13 ENCOUNTER — Ambulatory Visit (INDEPENDENT_AMBULATORY_CARE_PROVIDER_SITE_OTHER): Payer: Medicare Other | Admitting: Internal Medicine

## 2017-01-13 ENCOUNTER — Encounter: Payer: Self-pay | Admitting: Internal Medicine

## 2017-01-13 VITALS — BP 142/70 | HR 86 | Temp 98.0°F | Resp 16 | Wt 85.0 lb

## 2017-01-13 DIAGNOSIS — R634 Abnormal weight loss: Secondary | ICD-10-CM

## 2017-01-13 DIAGNOSIS — F32A Depression, unspecified: Secondary | ICD-10-CM

## 2017-01-13 DIAGNOSIS — R630 Anorexia: Secondary | ICD-10-CM

## 2017-01-13 DIAGNOSIS — F329 Major depressive disorder, single episode, unspecified: Secondary | ICD-10-CM

## 2017-01-13 DIAGNOSIS — E781 Pure hyperglyceridemia: Secondary | ICD-10-CM | POA: Diagnosis not present

## 2017-01-13 NOTE — Patient Instructions (Addendum)
Have blood work done in 2-3 months.  You will need to fast.   Test(s) ordered today. Your results will be released to [facility] (or called to you) after review, usually within 72hours after test completion. If any changes need to be made, you will be notified at that same time.    Medications reviewed and updated.  Changes include stopping the fenofibrate.     Please followup in 3 months

## 2017-01-13 NOTE — Assessment & Plan Note (Addendum)
Has gained 5 lbs since her last visit  Wt Readings from Last 3 Encounters:  01/13/17 85 lb (38.6 kg)  11/11/16 80 lb (36.3 kg)  10/09/16 86 lb 6.4 oz (39.2 kg)    Tolerating megace well - benefits outweigh risk Continue megace Follow up in 3 months with blood work prior

## 2017-01-13 NOTE — Assessment & Plan Note (Signed)
Controlled, stable Continue current dose of medication  

## 2017-01-13 NOTE — Assessment & Plan Note (Signed)
?   Still need fenofibrate Stop medication for now Check cholesterol panel in 2-3 months and we will re-evaluate

## 2017-01-13 NOTE — Assessment & Plan Note (Signed)
Improved with megace Continue above

## 2017-01-13 NOTE — Progress Notes (Signed)
Pre visit review using our clinic review tool, if applicable. No additional management support is needed unless otherwise documented below in the visit note. 

## 2017-01-20 ENCOUNTER — Other Ambulatory Visit: Payer: Self-pay | Admitting: Internal Medicine

## 2017-02-12 NOTE — Telephone Encounter (Signed)
Lovena Le can you close this encounter?

## 2017-03-21 ENCOUNTER — Other Ambulatory Visit: Payer: Self-pay | Admitting: Internal Medicine

## 2017-03-21 NOTE — Telephone Encounter (Signed)
Please advise, not on current med list.

## 2017-04-02 ENCOUNTER — Other Ambulatory Visit: Payer: Self-pay | Admitting: Internal Medicine

## 2017-04-03 ENCOUNTER — Encounter: Payer: Self-pay | Admitting: Vascular Surgery

## 2017-04-11 ENCOUNTER — Ambulatory Visit: Payer: Medicare Other | Admitting: Vascular Surgery

## 2017-04-11 ENCOUNTER — Ambulatory Visit (HOSPITAL_COMMUNITY): Payer: Medicare Other

## 2017-04-14 ENCOUNTER — Encounter: Payer: Self-pay | Admitting: Internal Medicine

## 2017-04-14 ENCOUNTER — Ambulatory Visit (INDEPENDENT_AMBULATORY_CARE_PROVIDER_SITE_OTHER): Payer: Medicare Other | Admitting: Internal Medicine

## 2017-04-14 VITALS — BP 156/68 | HR 103 | Temp 98.8°F | Resp 16 | Wt 92.0 lb

## 2017-04-14 DIAGNOSIS — R630 Anorexia: Secondary | ICD-10-CM | POA: Diagnosis not present

## 2017-04-14 DIAGNOSIS — R030 Elevated blood-pressure reading, without diagnosis of hypertension: Secondary | ICD-10-CM

## 2017-04-14 DIAGNOSIS — R634 Abnormal weight loss: Secondary | ICD-10-CM | POA: Diagnosis not present

## 2017-04-14 NOTE — Progress Notes (Signed)
Pre visit review using our clinic review tool, if applicable. No additional management support is needed unless otherwise documented below in the visit note. 

## 2017-04-14 NOTE — Assessment & Plan Note (Signed)
Has gained weight with megace  Wt Readings from Last 3 Encounters:  04/14/17 92 lb (41.7 kg)  01/13/17 85 lb (38.6 kg)  11/11/16 80 lb (36.3 kg)    Will try holding megace and see how she does without it - restart if needed Follow up in 3 months

## 2017-04-14 NOTE — Progress Notes (Signed)
Subjective:    Patient ID: Kim Hardy, female    DOB: 1931-07-28, 83 y.o.   MRN: [id number]  HPI The patient is here for follow up.    Medications and allergies reviewed with patient and updated if appropriate.  Patient Active Problem List   Diagnosis Date Noted  . PVD (peripheral vascular disease) ([place]) 09/20/2016  . Bilateral hand numbness 09/11/2016  . Right leg pain 08/12/2016  . Hypertrophic toenail 08/12/2016  . Anorexia 07/10/2016  . Loss of weight 07/10/2016  . Depression 07/10/2016  . Hypertriglyceridemia 03/08/2016  . Diverticulitis of colon 03/08/2016  . laryngeal ca   . Lung cancer [facility])     Current Outpatient Prescriptions on File Prior to Visit  Medication Sig Dispense Refill  . aspirin EC 81 MG tablet Take 1 tablet (81 mg total) by mouth daily.    . dorzolamide-timolol (COSOPT) 22.3-6.8 MG/ML ophthalmic solution INSTILL 1 DROP INTO RIGHT EYE TWICE A DAY  2  . lactose free nutrition (BOOST) LIQD Take 237 mLs by mouth 3 (three) times daily between meals.    . megestrol (MEGACE) 40 MG/ML suspension TAKE 10 ML BY MOUTH DAILY 240 mL 5  . OVER THE COUNTER MEDICATION Place 1 drop into both eyes 2 (two) times daily as needed (dry eyes).     . promethazine (PHENERGAN) 25 MG tablet Take 1 tablet (25 mg total) by mouth every 8 (eight) hours as needed for nausea or vomiting. 30 tablet 0  . sertraline (ZOLOFT) 25 MG tablet TAKE 1 TABLET BY MOUTH DAILY 30 tablet 5   No current facility-administered medications on file prior to visit.     Past Medical History:  Diagnosis Date  . Allergy    sulfa-hives, boniva=tremors,statins=n/v  . Diverticulosis 04/22/16   ct abd/pelvis  . History of radiation therapy 04/01/2011- 4/11/212   right lung  lower lobe  . Hypercholesterolemia   . laryngeal ca dx'd 1997   surg only  . Lung cancer ([place]) dx'd 01-2011   xrt comp 03/2011  . Peripheral vascular disease [facility])     Past Surgical History:  Procedure Laterality Date  .  LARYNGECTOMY  1997  . TRACHEOSTOMY    . VOICE PROSTHESIS      Social History   Social History  . Marital status: Divorced    Spouse name: N/A  . Number of children: N/A  . Years of education: N/A   Social History Main Topics  . Smoking status: Former Smoker    Packs/day: 1.00    Years: 20.00    Quit date: 03/30/1996  . Smokeless tobacco: Not on file     Comment: quit w/dx laryngeal cancer 1997  . Alcohol use No  . Drug use: No  . Sexual activity: Not on file   Other Topics Concern  . Not on file   Social History Narrative  . No narrative on file    No family history on file.  Review of Systems  Constitutional: Negative for fatigue and fever.  Respiratory: Positive for shortness of breath (with trach) and wheezing (trach related). Negative for cough.   Cardiovascular: Negative for chest pain and palpitations.  Gastrointestinal: Negative for abdominal pain.  Neurological: Positive for headaches.       Objective:   Vitals:   04/14/17 1347  BP: (!) 156/68  Pulse: (!) 103  Resp: 16  Temp: 98.8 F (37.1 C)   Wt Readings from Last 3 Encounters:  04/14/17 92 lb (41.7 kg)  01/13/17  85 lb (38.6 kg)  11/11/16 80 lb (36.3 kg)   Body mass index is 17.97 kg/m.   Physical Exam    Constitutional: Appears well-developed and well-nourished. No distress.  HENT:  Head: Normocephalic and atraumatic.  Neck: Neck supple. No tracheal deviation present. No thyromegaly present.  No cervical lymphadenopathy Cardiovascular: Normal rate, regular rhythm and normal heart sounds.   No murmur heard. No carotid bruit .  No edema Pulmonary/Chest: Effort normal and breath sounds normal. No respiratory distress. No has no wheezes. No rales.  Skin: Skin is warm and dry. Not diaphoretic.  Psychiatric: Normal mood and affect. Behavior is normal.      Assessment & Plan:    See Problem List for Assessment and Plan of chronic medical problems.

## 2017-04-14 NOTE — Assessment & Plan Note (Signed)
Decreased appetite  Improved with megace and has gained weight Continue with increased calories

## 2017-04-14 NOTE — Assessment & Plan Note (Addendum)
BP Readings from Last 3 Encounters:  04/14/17 (!) 156/68  01/13/17 (!) 142/70  11/11/16 (!) 112/54    Elevated today - will have it rechecked today before leaving Typically not elevated Decreased sodium intake

## 2017-04-14 NOTE — Patient Instructions (Addendum)
Have your blood work done when you can.   Stop the megace when you finish the current bottle and see how you do without it. Restart if needed.    Medications reviewed and updated.  No changes recommended at this time.    Please followup in 3 months

## 2017-07-01 ENCOUNTER — Encounter (HOSPITAL_COMMUNITY): Payer: Self-pay | Admitting: Emergency Medicine

## 2017-07-01 ENCOUNTER — Inpatient Hospital Stay (HOSPITAL_COMMUNITY)
Admission: EM | Admit: 2017-07-01 | Discharge: 2017-07-07 | DRG: 871 | Disposition: A | Discharge status: Skilled Nursing Facility | Payer: Medicare Other | Attending: Internal Medicine | Admitting: Internal Medicine

## 2017-07-01 ENCOUNTER — Emergency Department (HOSPITAL_COMMUNITY): Payer: Medicare Other

## 2017-07-01 DIAGNOSIS — F329 Major depressive disorder, single episode, unspecified: Secondary | ICD-10-CM | POA: Diagnosis present

## 2017-07-01 DIAGNOSIS — R110 Nausea: Secondary | ICD-10-CM | POA: Diagnosis not present

## 2017-07-01 DIAGNOSIS — E876 Hypokalemia: Secondary | ICD-10-CM | POA: Diagnosis present

## 2017-07-01 DIAGNOSIS — E860 Dehydration: Secondary | ICD-10-CM | POA: Diagnosis not present

## 2017-07-01 DIAGNOSIS — J9690 Respiratory failure, unspecified, unspecified whether with hypoxia or hypercapnia: Secondary | ICD-10-CM | POA: Diagnosis present

## 2017-07-01 DIAGNOSIS — A419 Sepsis, unspecified organism: Principal | ICD-10-CM | POA: Diagnosis present

## 2017-07-01 DIAGNOSIS — C3490 Malignant neoplasm of unspecified part of unspecified bronchus or lung: Secondary | ICD-10-CM | POA: Diagnosis present

## 2017-07-01 DIAGNOSIS — E43 Unspecified severe protein-calorie malnutrition: Secondary | ICD-10-CM | POA: Diagnosis present

## 2017-07-01 DIAGNOSIS — Z87891 Personal history of nicotine dependence: Secondary | ICD-10-CM | POA: Diagnosis not present

## 2017-07-01 DIAGNOSIS — E871 Hypo-osmolality and hyponatremia: Secondary | ICD-10-CM | POA: Diagnosis present

## 2017-07-01 DIAGNOSIS — Z79899 Other long term (current) drug therapy: Secondary | ICD-10-CM | POA: Diagnosis not present

## 2017-07-01 DIAGNOSIS — Z888 Allergy status to other drugs, medicaments and biological substances status: Secondary | ICD-10-CM | POA: Diagnosis not present

## 2017-07-01 DIAGNOSIS — Z681 Body mass index (BMI) 19 or less, adult: Secondary | ICD-10-CM | POA: Diagnosis not present

## 2017-07-01 DIAGNOSIS — Z930 Tracheostomy status: Secondary | ICD-10-CM

## 2017-07-01 DIAGNOSIS — T17990A Other foreign object in respiratory tract, part unspecified in causing asphyxiation, initial encounter: Secondary | ICD-10-CM | POA: Diagnosis present

## 2017-07-01 DIAGNOSIS — N179 Acute kidney failure, unspecified: Secondary | ICD-10-CM | POA: Diagnosis present

## 2017-07-01 DIAGNOSIS — E222 Syndrome of inappropriate secretion of antidiuretic hormone: Secondary | ICD-10-CM | POA: Diagnosis present

## 2017-07-01 DIAGNOSIS — Z9002 Acquired absence of larynx: Secondary | ICD-10-CM | POA: Diagnosis not present

## 2017-07-01 DIAGNOSIS — Z7982 Long term (current) use of aspirin: Secondary | ICD-10-CM

## 2017-07-01 DIAGNOSIS — B961 Klebsiella pneumoniae [K. pneumoniae] as the cause of diseases classified elsewhere: Secondary | ICD-10-CM | POA: Diagnosis present

## 2017-07-01 DIAGNOSIS — R531 Weakness: Secondary | ICD-10-CM | POA: Diagnosis not present

## 2017-07-01 DIAGNOSIS — F32A Depression, unspecified: Secondary | ICD-10-CM | POA: Diagnosis present

## 2017-07-01 DIAGNOSIS — K529 Noninfective gastroenteritis and colitis, unspecified: Secondary | ICD-10-CM | POA: Diagnosis not present

## 2017-07-01 DIAGNOSIS — R630 Anorexia: Secondary | ICD-10-CM | POA: Diagnosis present

## 2017-07-01 DIAGNOSIS — I739 Peripheral vascular disease, unspecified: Secondary | ICD-10-CM | POA: Diagnosis present

## 2017-07-01 DIAGNOSIS — Z882 Allergy status to sulfonamides status: Secondary | ICD-10-CM | POA: Diagnosis not present

## 2017-07-01 DIAGNOSIS — Z8521 Personal history of malignant neoplasm of larynx: Secondary | ICD-10-CM

## 2017-07-01 DIAGNOSIS — M1611 Unilateral primary osteoarthritis, right hip: Secondary | ICD-10-CM | POA: Diagnosis present

## 2017-07-01 DIAGNOSIS — N390 Urinary tract infection, site not specified: Secondary | ICD-10-CM | POA: Diagnosis present

## 2017-07-01 DIAGNOSIS — Z85118 Personal history of other malignant neoplasm of bronchus and lung: Secondary | ICD-10-CM | POA: Diagnosis not present

## 2017-07-01 LAB — URINALYSIS, ROUTINE W REFLEX MICROSCOPIC
BILIRUBIN URINE: NEGATIVE
GLUCOSE, UA: NEGATIVE mg/dL
HGB URINE DIPSTICK: NEGATIVE
KETONES UR: NEGATIVE mg/dL
NITRITE: NEGATIVE
PROTEIN: NEGATIVE mg/dL
Specific Gravity, Urine: 1.016 (ref 1.005–1.030)
Squamous Epithelial / LPF: NONE SEEN
pH: 7 (ref 5.0–8.0)

## 2017-07-01 LAB — COMPREHENSIVE METABOLIC PANEL
ALK PHOS: 62 U/L (ref 38–126)
ALT: 12 U/L — ABNORMAL LOW (ref 14–54)
ANION GAP: 6 (ref 5–15)
AST: 22 U/L (ref 15–41)
Albumin: 3.7 g/dL (ref 3.5–5.0)
BILIRUBIN TOTAL: 0.7 mg/dL (ref 0.3–1.2)
BUN: 19 mg/dL (ref 6–20)
CALCIUM: 9 mg/dL (ref 8.9–10.3)
CO2: 25 mmol/L (ref 22–32)
Chloride: 99 mmol/L — ABNORMAL LOW (ref 101–111)
Creatinine, Ser: 0.91 mg/dL (ref 0.44–1.00)
GFR calc non Af Amer: 56 mL/min — ABNORMAL LOW (ref >60)
Glucose, Bld: 96 mg/dL (ref 65–99)
POTASSIUM: 4.7 mmol/L (ref 3.5–5.1)
SODIUM: 130 mmol/L — AB (ref 135–145)
TOTAL PROTEIN: 7.6 g/dL (ref 6.5–8.1)

## 2017-07-01 LAB — CBC
HCT: 37.8 % (ref 36.0–46.0)
HEMOGLOBIN: 12.9 g/dL (ref 12.0–15.0)
MCH: 32.3 pg (ref 26.0–34.0)
MCHC: 34.1 g/dL (ref 30.0–36.0)
MCV: 94.7 fL (ref 78.0–100.0)
Platelets: 323 10*3/uL (ref 150–400)
RBC: 3.99 MIL/uL (ref 3.87–5.11)
RDW: 12.1 % (ref 11.5–15.5)
WBC: 9.3 10*3/uL (ref 4.0–10.5)

## 2017-07-01 LAB — LIPASE, BLOOD: Lipase: 30 U/L (ref 11–51)

## 2017-07-01 LAB — I-STAT CG4 LACTIC ACID, ED: Lactic Acid, Venous: 0.97 mmol/L (ref 0.5–1.9)

## 2017-07-01 MED ORDER — SODIUM CHLORIDE 0.9 % IV BOLUS (SEPSIS): 500.0000 mL | Freq: Once | INTRAVENOUS | Status: DC

## 2017-07-01 MED ORDER — DIMENHYDRINATE 50 MG PO TABS: 50.0000 mg | ORAL_TABLET | Freq: Three times a day (TID) | ORAL | Status: DC | PRN

## 2017-07-01 MED ORDER — ONDANSETRON HCL 4 MG PO TABS
4.0000 mg | ORAL_TABLET | Freq: Four times a day (QID) | ORAL | Status: DC | PRN
  Administered 2017-07-04 – 2017-07-07 (×2): 4 mg via ORAL
  Filled 2017-07-01 (×2): qty 1

## 2017-07-01 MED ORDER — METRONIDAZOLE IN NACL 5-0.79 MG/ML-% IV SOLN
500.0000 mg | Freq: Three times a day (TID) | INTRAVENOUS | Status: DC
  Administered 2017-07-02 (×2): 500 mg via INTRAVENOUS
  Filled 2017-07-01 (×3): qty 100

## 2017-07-01 MED ORDER — ACETAMINOPHEN 325 MG PO TABS
650.0000 mg | ORAL_TABLET | Freq: Four times a day (QID) | ORAL | Status: DC | PRN
Start: 2017-07-01 — End: 2017-07-07
  Filled 2017-07-01: qty 2

## 2017-07-01 MED ORDER — TRAMADOL HCL 50 MG PO TABS
50.0000 mg | ORAL_TABLET | Freq: Two times a day (BID) | ORAL | Status: DC | PRN
Start: 2017-07-02 — End: 2017-07-07
  Administered 2017-07-02: 50 mg via ORAL
  Filled 2017-07-01: qty 1

## 2017-07-01 MED ORDER — IOPAMIDOL (ISOVUE-300) INJECTION 61%
INTRAVENOUS | Status: AC
  Filled 2017-07-01: qty 100

## 2017-07-01 MED ORDER — ENOXAPARIN SODIUM 30 MG/0.3ML ~~LOC~~ SOLN
30.0000 mg | Freq: Every day | SUBCUTANEOUS | Status: DC
  Administered 2017-07-02 – 2017-07-05 (×5): 30 mg via SUBCUTANEOUS
  Filled 2017-07-01 (×5): qty 0.3

## 2017-07-01 MED ORDER — SODIUM CHLORIDE 0.9 % IV SOLN
INTRAVENOUS | Status: DC
  Administered 2017-07-02 – 2017-07-03 (×2): via INTRAVENOUS

## 2017-07-01 MED ORDER — IOPAMIDOL (ISOVUE-300) INJECTION 61%: 100.0000 mL | Freq: Once | INTRAVENOUS | Status: DC | PRN

## 2017-07-01 MED ORDER — DORZOLAMIDE HCL-TIMOLOL MAL 2-0.5 % OP SOLN
1.0000 [drp] | Freq: Two times a day (BID) | OPHTHALMIC | Status: DC
  Administered 2017-07-02 – 2017-07-07 (×11): 1 [drp] via OPHTHALMIC
  Filled 2017-07-01 (×2): qty 10

## 2017-07-01 MED ORDER — ACETAMINOPHEN 650 MG RE SUPP: 650.0000 mg | Freq: Four times a day (QID) | RECTAL | Status: DC | PRN

## 2017-07-01 MED ORDER — SERTRALINE HCL 50 MG PO TABS
25.0000 mg | ORAL_TABLET | Freq: Every day | ORAL | Status: DC
  Administered 2017-07-02 – 2017-07-07 (×5): 25 mg via ORAL
  Filled 2017-07-01 (×7): qty 1

## 2017-07-01 MED ORDER — DEXTROSE 5 % IV SOLN
1.0000 g | Freq: Once | INTRAVENOUS | Status: AC
  Administered 2017-07-01: 1 g via INTRAVENOUS
  Filled 2017-07-01: qty 10

## 2017-07-01 MED ORDER — SODIUM CHLORIDE 0.9 % IV BOLUS (SEPSIS)
250.0000 mL | Freq: Once | INTRAVENOUS | Status: AC
  Administered 2017-07-01: 250 mL via INTRAVENOUS

## 2017-07-01 MED ORDER — DEXTROSE 5 % IV SOLN
1.0000 g | INTRAVENOUS | Status: DC
  Administered 2017-07-02 – 2017-07-06 (×5): 1 g via INTRAVENOUS
  Filled 2017-07-01 (×6): qty 10

## 2017-07-01 MED ORDER — ASPIRIN EC 81 MG PO TBEC
81.0000 mg | DELAYED_RELEASE_TABLET | Freq: Every day | ORAL | Status: DC
  Administered 2017-07-02: 81 mg via ORAL
  Filled 2017-07-01 (×2): qty 1

## 2017-07-01 MED ORDER — ONDANSETRON HCL 4 MG/2ML IJ SOLN
4.0000 mg | Freq: Four times a day (QID) | INTRAMUSCULAR | Status: DC | PRN
  Administered 2017-07-02 – 2017-07-06 (×9): 4 mg via INTRAVENOUS
  Filled 2017-07-01 (×9): qty 2

## 2017-07-01 MED ORDER — SODIUM CHLORIDE 0.9 % IV BOLUS (SEPSIS)
500.0000 mL | Freq: Once | INTRAVENOUS | Status: AC
  Administered 2017-07-01: 500 mL via INTRAVENOUS

## 2017-07-01 MED ORDER — BOOST PO LIQD
237.0000 mL | Freq: Three times a day (TID) | ORAL | Status: DC
Start: 2017-07-02 — End: 2017-07-07
  Administered 2017-07-02 – 2017-07-07 (×8): 237 mL via ORAL
  Filled 2017-07-01 (×18): qty 237
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
## 2017-07-01 NOTE — ED Notes (Signed)
Attempted to call report to 5E; Unit stated that a nurse was called in and that they are unable to take the patient until that RN gets here; they advised will call back when RN arrives

## 2017-07-01 NOTE — ED Notes (Signed)
Patient transported to CT 

## 2017-07-01 NOTE — ED Notes (Signed)
Patient transported to X-ray 

## 2017-07-01 NOTE — ED Provider Notes (Signed)
Medical screening examination/treatment/procedure(s) were conducted as a shared visit with non-physician practitioner(s) and myself.  I personally evaluated the patient during the encounter.   EKG Interpretation None     83 year old patient who presents with right hip pain as well as diffuse weakness. Has evidence of UTI. Started on Rocephin will be admitted to the hospitalist service   Lacretia Leigh, MD 07/01/17 2226

## 2017-07-01 NOTE — ED Triage Notes (Addendum)
Patient c/o nausea and decreased appetite x1 week and worsening right leg pain x1 month. Denies abdominal pain, N/V/D. Patient has a trach. Denies chest pain and SOB.

## 2017-07-02 DIAGNOSIS — N390 Urinary tract infection, site not specified: Secondary | ICD-10-CM

## 2017-07-02 DIAGNOSIS — M1611 Unilateral primary osteoarthritis, right hip: Secondary | ICD-10-CM | POA: Diagnosis present

## 2017-07-02 DIAGNOSIS — F329 Major depressive disorder, single episode, unspecified: Secondary | ICD-10-CM

## 2017-07-02 DIAGNOSIS — C3490 Malignant neoplasm of unspecified part of unspecified bronchus or lung: Secondary | ICD-10-CM

## 2017-07-02 DIAGNOSIS — R531 Weakness: Secondary | ICD-10-CM

## 2017-07-02 DIAGNOSIS — R110 Nausea: Secondary | ICD-10-CM | POA: Diagnosis present

## 2017-07-02 LAB — BASIC METABOLIC PANEL
ANION GAP: 11 (ref 5–15)
BUN: 10 mg/dL (ref 6–20)
CALCIUM: 8.1 mg/dL — AB (ref 8.9–10.3)
CO2: 19 mmol/L — AB (ref 22–32)
CREATININE: 0.65 mg/dL (ref 0.44–1.00)
Chloride: 105 mmol/L (ref 101–111)
GFR calc Af Amer: >60 mL/min (ref >60)
GFR calc non Af Amer: >60 mL/min (ref >60)
GLUCOSE: 81 mg/dL (ref 65–99)
Potassium: 3.8 mmol/L (ref 3.5–5.1)
Sodium: 135 mmol/L (ref 135–145)

## 2017-07-02 LAB — CBC
HEMATOCRIT: 33.4 % — AB (ref 36.0–46.0)
Hemoglobin: 11.5 g/dL — ABNORMAL LOW (ref 12.0–15.0)
MCH: 31.9 pg (ref 26.0–34.0)
MCHC: 34.4 g/dL (ref 30.0–36.0)
MCV: 92.8 fL (ref 78.0–100.0)
Platelets: 286 10*3/uL (ref 150–400)
RBC: 3.6 MIL/uL — ABNORMAL LOW (ref 3.87–5.11)
RDW: 12.1 % (ref 11.5–15.5)
WBC: 9.1 10*3/uL (ref 4.0–10.5)

## 2017-07-02 LAB — TSH: TSH: 2.449 u[IU]/mL (ref 0.350–4.500)
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
## 2017-07-02 NOTE — Progress Notes (Signed)
PT Cancellation Note  Patient Details Name: Kim Hardy MRN: [id number] DOB: 1931/04/13   Cancelled Treatment:     PT eval attempted but deferred at request of pt 2* pain and fatigue.  Will follow.   Hannan Hutmacher 07/02/2017, 11:51 AM

## 2017-07-02 NOTE — Progress Notes (Signed)
Lovenox per Pharmacy for DVT Prophylaxis    Pharmacy has been consulted from dosing enoxaparin (lovenox) in this patient for DVT prophylaxis.  The pharmacist has reviewed pertinent labs (Hgb _12.9__; PLT_323__), patient weight (_40.8__kg) and renal function (CrCl_29.6__mL/min) and decided that enoxaparin _30_mg SQ Q24Hrs is appropriate for this patient.  The pharmacy department will sign off at this time.  Please reconsult pharmacy if status changes or for further issues.  Thank you  Cyndia Diver PharmD, BCPS  07/02/2017, 1:32 AM

## 2017-07-02 NOTE — Progress Notes (Signed)
CSW spoke with patient niece to discuss patient discharge plan. She reports the patient prefers to return home, the patient is "fiercely independent." She reports the patient has been living at home for 60 years and is beginning to need more help. She reports she has started to talk about advanced directives with the patient. CSW educated her about completing paperwork at hospital.  PT is pending to help determine patient discharge plan.  CSW following for social service needs.   Kathrin Greathouse, Latanya Presser, MSW Clinical Social Worker 5E and Psychiatric Service Line [phone] 07/02/2017  2:48 PM

## 2017-07-02 NOTE — ED Notes (Signed)
Report given to 5E 

## 2017-07-03 DIAGNOSIS — E860 Dehydration: Secondary | ICD-10-CM

## 2017-07-03 LAB — CBC WITH DIFFERENTIAL/PLATELET
BASOS ABS: 0 10*3/uL (ref 0.0–0.1)
Basophils Relative: 0 %
EOS PCT: 5 %
Eosinophils Absolute: 0.5 10*3/uL (ref 0.0–0.7)
HEMATOCRIT: 36.6 % (ref 36.0–46.0)
HEMOGLOBIN: 12.3 g/dL (ref 12.0–15.0)
LYMPHS ABS: 0.9 10*3/uL (ref 0.7–4.0)
LYMPHS PCT: 10 %
MCH: 31.9 pg (ref 26.0–34.0)
MCHC: 33.6 g/dL (ref 30.0–36.0)
MCV: 94.8 fL (ref 78.0–100.0)
Monocytes Absolute: 0.7 10*3/uL (ref 0.1–1.0)
Monocytes Relative: 8 %
NEUTROS ABS: 6.9 10*3/uL (ref 1.7–7.7)
NEUTROS PCT: 77 %
Platelets: 294 10*3/uL (ref 150–400)
RBC: 3.86 MIL/uL — AB (ref 3.87–5.11)
RDW: 12.1 % (ref 11.5–15.5)
WBC: 9 10*3/uL (ref 4.0–10.5)

## 2017-07-03 LAB — BASIC METABOLIC PANEL
ANION GAP: 11 (ref 5–15)
BUN: 9 mg/dL (ref 6–20)
CHLORIDE: 99 mmol/L — AB (ref 101–111)
CO2: 21 mmol/L — AB (ref 22–32)
Calcium: 8.3 mg/dL — ABNORMAL LOW (ref 8.9–10.3)
Creatinine, Ser: 0.5 mg/dL (ref 0.44–1.00)
GFR calc Af Amer: >60 mL/min (ref >60)
GLUCOSE: 88 mg/dL (ref 65–99)
POTASSIUM: 3.9 mmol/L (ref 3.5–5.1)
Sodium: 131 mmol/L — ABNORMAL LOW (ref 135–145)

## 2017-07-03 MED ORDER — ASPIRIN EC 81 MG PO TBEC
81.0000 mg | DELAYED_RELEASE_TABLET | Freq: Every day | ORAL | Status: DC
  Administered 2017-07-03 – 2017-07-05 (×2): 81 mg via ORAL
  Filled 2017-07-03 (×3): qty 1

## 2017-07-03 MED ORDER — PROMETHAZINE HCL 25 MG/ML IJ SOLN
12.5000 mg | Freq: Once | INTRAMUSCULAR | Status: AC
  Administered 2017-07-03: 12.5 mg via INTRAVENOUS
  Filled 2017-07-03: qty 1

## 2017-07-03 NOTE — Progress Notes (Signed)
Initial Nutrition Assessment  DOCUMENTATION CODES:   Severe malnutrition in context of chronic illness  INTERVENTION:   Boost Plus chocolate TID- Each supplement provides 360kcal and 14g protein.    NUTRITION DIAGNOSIS:   Malnutrition (severe) related to cancer and cancer related treatments (advanced age) as evidenced by severe depletion of muscle mass, severe depletion of body fat.  GOAL:   Patient will meet greater than or equal to 90% of their needs  MONITOR:   PO intake, Supplement acceptance, Labs, Weight trends  REASON FOR ASSESSMENT:   Malnutrition Screening Tool    ASSESSMENT:   83 year old female who presented with right hip pain. Patient is known to have laryngeal and lung cancer, status post tracheostomy. Worsening right hip pain over last 4 weeks prior to hospitalization, radiation down to the right lower extremity, progressive to the point where patient was unable to ambulate. Pt admitted for UTI with sepsis   Met with pt in room today. Pt reports poor appetite and oral intake for 2 weeks pta. Pt reports continued poor appetite today and states that food tastes bad. Pt is documented to be eating 50-100% of meals. Pt also drinks Boost Plus. Per chart, pt is weight stable. Pt reports nausea and vomiting last night but improved today. Per nurse tech, pt ate well at breakfast this morning.   Medications reviewed and include: aspirin, lovenox, ceftriaxone, zofran  Labs reviewed: Na 131(L), CL 99(L), Ca 8.3(L)  Nutrition-Focused physical exam completed. Findings are severe fat and  muscle depletions over entire body, and no edema.   Diet Order:  Diet Heart Room service appropriate? Yes; Fluid consistency: Thin  Skin:  Reviewed, no issues  Last BM:  none since admit  Height:   Ht Readings from Last 1 Encounters:  07/01/17 5' (1.524 m)    Weight:   Wt Readings from Last 1 Encounters:  07/01/17 90 lb (40.8 kg)    Ideal Body Weight:  45.4 kg  BMI:  Body  mass index is 17.58 kg/m.  Estimated Nutritional Needs:   Kcal:  1400-1600kcal/day   Protein:  74-82g/day   Fluid:  >1.4L/day   EDUCATION NEEDS:   No education needs identified at this time  Koleen Distance MS, RD, [place] Pager #[phone] After Hours Pager: [phone]

## 2017-07-03 NOTE — Progress Notes (Signed)
PT Cancellation Note  Patient Details Name: AAYUSHI SOLORZANO MRN: [id number] DOB: 05/18/1931   Cancelled Treatment:     PT attempted x 2 this date.  Pt declined x 2 c/o fatigue and nausea.  RN present on 2nd attempt.  Will follow.   Hansel Devan 07/03/2017, 3:34 PM
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
## 2017-07-04 DIAGNOSIS — E43 Unspecified severe protein-calorie malnutrition: Secondary | ICD-10-CM | POA: Insufficient documentation

## 2017-07-04 LAB — CBC WITH DIFFERENTIAL/PLATELET
BASOS ABS: 0 10*3/uL (ref 0.0–0.1)
BASOS PCT: 0 %
Eosinophils Absolute: 0.6 10*3/uL (ref 0.0–0.7)
Eosinophils Relative: 7 %
HEMATOCRIT: 37.1 % (ref 36.0–46.0)
HEMOGLOBIN: 12.8 g/dL (ref 12.0–15.0)
LYMPHS PCT: 14 %
Lymphs Abs: 1.2 10*3/uL (ref 0.7–4.0)
MCH: 31.8 pg (ref 26.0–34.0)
MCHC: 34.5 g/dL (ref 30.0–36.0)
MCV: 92.1 fL (ref 78.0–100.0)
MONO ABS: 1 10*3/uL (ref 0.1–1.0)
MONOS PCT: 12 %
NEUTROS ABS: 5.9 10*3/uL (ref 1.7–7.7)
NEUTROS PCT: 67 %
Platelets: 350 10*3/uL (ref 150–400)
RBC: 4.03 MIL/uL (ref 3.87–5.11)
RDW: 11.7 % (ref 11.5–15.5)
WBC: 8.7 10*3/uL (ref 4.0–10.5)

## 2017-07-04 LAB — BASIC METABOLIC PANEL
ANION GAP: 10 (ref 5–15)
BUN: 5 mg/dL — ABNORMAL LOW (ref 6–20)
CHLORIDE: 95 mmol/L — AB (ref 101–111)
CO2: 23 mmol/L (ref 22–32)
Calcium: 8.2 mg/dL — ABNORMAL LOW (ref 8.9–10.3)
Creatinine, Ser: 0.51 mg/dL (ref 0.44–1.00)
GFR calc non Af Amer: >60 mL/min (ref >60)
GLUCOSE: 86 mg/dL (ref 65–99)
Potassium: 3.4 mmol/L — ABNORMAL LOW (ref 3.5–5.1)
Sodium: 128 mmol/L — ABNORMAL LOW (ref 135–145)

## 2017-07-04 LAB — OSMOLALITY, URINE: Osmolality, Ur: 476 mOsm/kg (ref 300–900)

## 2017-07-04 LAB — URINE CULTURE: Culture: >=100000 — AB

## 2017-07-04 LAB — NA AND K (SODIUM & POTASSIUM), RAND UR
Potassium Urine: 35 mmol/L
SODIUM UR: 156 mmol/L

## 2017-07-04 LAB — OSMOLALITY: Osmolality: 258 mOsm/kg — ABNORMAL LOW (ref 275–295)

## 2017-07-04 MED ORDER — BISACODYL 5 MG PO TBEC
10.0000 mg | DELAYED_RELEASE_TABLET | Freq: Once | ORAL | Status: AC
  Administered 2017-07-04: 10 mg via ORAL
  Filled 2017-07-04: qty 2

## 2017-07-04 MED ORDER — FAMOTIDINE 20 MG PO TABS
20.0000 mg | ORAL_TABLET | Freq: Every day | ORAL | Status: DC
  Administered 2017-07-04 – 2017-07-07 (×4): 20 mg via ORAL
  Filled 2017-07-04 (×4): qty 1

## 2017-07-04 NOTE — Progress Notes (Deleted)
Report called to [facility] on [facility] at [facility].  Patient transported by [facility]. 

## 2017-07-04 NOTE — Evaluation (Signed)
Occupational Therapy Evaluation Patient Details Name: Kim Hardy MRN: [id number] DOB: 09-Aug-1931 Today's Date: 07/04/2017    History of Present Illness 83 yo female admitted with UTI, sepsis. Hx of laryngeal cancer, lung cancer, tracheostomy   Clinical Impression   Pt admitted with UTI. Pt currently with functional limitations due to the deficits listed below (see OT Problem List).  Pt will benefit from skilled OT to increase their safety and independence with ADL and functional mobility for ADL to facilitate discharge to venue listed below.      Follow Up Recommendations  SNF          Precautions / Restrictions Precautions Precautions: Fall Restrictions Weight Bearing Restrictions: No      Mobility Bed Mobility Overal bed mobility: Needs Assistance Bed Mobility: Supine to Sit;Sit to Supine     Supine to sit: Supervision Sit to supine: Supervision   General bed mobility comments: for safety, lines  Transfers Overall transfer level: Needs assistance Equipment used: 1 person hand held assist Transfers: Sit to/from [facility] Sit to Stand: Min assist Stand pivot transfers: Min assist       General transfer comment: bed to chair    Balance Overall balance assessment: Needs assistance           Standing balance-Leahy Scale: Fair                             ADL either performed or assessed with clinical judgement   ADL Overall ADL's : Needs assistance/impaired Eating/Feeding: Set up;Sitting   Grooming: Minimal assistance;Standing   Upper Body Bathing: Minimal assistance;Sitting   Lower Body Bathing: Moderate assistance;Sit to/from stand;Cueing for sequencing;Cueing for safety   Upper Body Dressing : Minimal assistance;Sitting   Lower Body Dressing: Moderate assistance;Sit to/from stand;Cueing for safety;Cueing for sequencing   Toilet Transfer: Minimal assistance;BSC;Cueing for safety;Cueing for sequencing   Toileting-  Clothing Manipulation and Hygiene: Moderate assistance;Sit to/from stand;Cueing for safety;Cueing for sequencing         General ADL Comments: pt has limited A at home. Pt will need ST SNF to regain I with ADL activity      Vision Patient Visual Report: No change from baseline       Perception     Praxis      Pertinent Vitals/Pain Pain Assessment: Faces Faces Pain Scale: Hurts a little bit Pain Location: R LE with activity Pain Descriptors / Indicators: Sore Pain Intervention(s): Monitored during session;Repositioned;Limited activity within patient's tolerance     Hand Dominance     Extremity/Trunk Assessment Upper Extremity Assessment Upper Extremity Assessment: Generalized weakness   Lower Extremity Assessment Lower Extremity Assessment: Generalized weakness   Cervical / Trunk Assessment Cervical / Trunk Assessment: Kyphotic   Communication Communication Communication: No difficulties   Cognition Arousal/Alertness: Awake/alert Behavior During Therapy: WFL for tasks assessed/performed Overall Cognitive Status: Within Functional Limits for tasks assessed                                 General Comments: appears WFL during session              Home Living Family/patient expects to be discharged to:: Skilled nursing facility Living Arrangements: Alone   Type of Home: House       Home Layout: One level               Home Equipment:  None          Prior Functioning/Environment Level of Independence: Independent                 OT Problem List: Decreased strength;Decreased activity tolerance;Impaired balance (sitting and/or standing);Decreased safety awareness;Decreased knowledge of use of DME or AE      OT Treatment/Interventions: Self-care/ADL training;DME and/or AE instruction;Patient/family education;Therapeutic activities    OT Goals(Current goals can be found in the care plan section) Acute Rehab OT Goals Patient  Stated Goal: to regain PLOF OT Goal Formulation: With patient Time For Goal Achievement: 07/18/17 Potential to Achieve Goals: Good  OT Frequency: Min 2X/week              AM-PAC PT "6 Clicks" Daily Activity     Outcome Measure Help from another person eating meals?: None Help from another person taking care of personal grooming?: A Little Help from another person toileting, which includes using toliet, bedpan, or urinal?: A Lot Help from another person bathing (including washing, rinsing, drying)?: A Little Help from another person to put on and taking off regular upper body clothing?: A Lot Help from another person to put on and taking off regular lower body clothing?: A Lot 6 Click Score: 16   End of Session Equipment Utilized During Treatment: Rolling walker Nurse Communication: Mobility status  Activity Tolerance: Patient tolerated treatment well Patient left: in bed;with call bell/phone within reach  OT Visit Diagnosis: Unsteadiness on feet (R26.81);Muscle weakness (generalized) (M62.81);Pain                Time: 1135-1146 OT Time Calculation (min): 11 min Charges:  OT General Charges $OT Visit: 1 Procedure OT Evaluation $OT Eval Moderate Complexity: 1 Procedure G-Codes:     Kari Baars, [place]  Payton Mccallum D 07/04/2017, 11:52 AM

## 2017-07-04 NOTE — Progress Notes (Signed)
PROGRESS NOTE    Kim Hardy  IHK:[id number] DOB: 03-08-1931 DOA: 07/01/2017 PCP: [name], MD    Brief Narrative:  83 year old female who presented with right hip pain. Patient is known to have laryngeal and lung cancer, status post tracheostomy. Worsening right hip pain over last 4 weeks prior to hospitalization, radiation down to the right lower extremity, progressive to the point where patient was unable to ambulate. On initial physical examination, patient was afebrile, respiratory 25, blood pressure 85/67. Dry mucous membranes, lungs were clear to auscultation bilaterally, heart S1-S2 present and rhythmic, the abdomen was tender in lower quadrants, no lower extremity edema. Urinalysis positive for infection. Patient was admitted to the hospital with working diagnosis of sepsis due to urinary tract infection. Noted worsening weakness and hyponatremia. Placed on fluid restriction, waiting for physical therapy and occupational therapy evaluations, for discharge planning.    Assessment & Plan:   Principal Problem:   UTI (urinary tract infection) Active Problems:   Lung cancer (HCC)   Colitis   Anorexia   Depression   Nausea   Osteoarthritis of right hip   Protein-calorie malnutrition, severe   1. Sepsis due to urine tract infection, present on admission. Antibiotic therapyCeftriaxone IV #2, wbc stable at 8,7, patient has remained afebrile. Urine culture positive to aerococcus, will follow on sensitivities. Hold on IV fluids. Patient very weak and deconditioned, will ask physical therapy to evaluate patient for discharge planing.   2. History of laryngeal and lung cancer. Tracheostomy stoma in place, no significant secretions, patient with no dyspnea.  3. Depression. On sertraline, no confusion or agitation.   4. Hyponatremia. Worsening hyponatremia with IV fluids, will check serum and urine osmolality, urine electrolytes, suspected SIADH. Fluid restriction and follow on  renal panel in am, patient clinically euvolemic.   DVT prophylaxis:enoxaparin  Code Status:Full  Family Communication:No family at the bedside  Disposition Plan: Home    Consultants:    Procedures:    Antimicrobials  Ceftriaxone   Subjective: Patient feeling very weak and deconditioned, poor oral intake, no nausea or vomiting, no recent bowel movement, positive abdominal pain at the lower quadrants, no dyspnea or chest pain.   Objective: Vitals:   07/03/17 1944 07/03/17 2045 07/04/17 0633 07/04/17 0850  BP:  (!) 142/66 (!) 149/71   Pulse: (!) 108 (!) 110 99 (!) 111  Resp: 16 18 18 18   Temp:  99.5 F (37.5 C) 98.8 F (37.1 C)   TempSrc:  Oral Oral   SpO2: 95% 96% 95% 95%  Weight:      Height:        Intake/Output Summary (Last 24 hours) at 07/04/17 1109 Last data filed at 07/03/17 2046  Gross per 24 hour  Intake          1003.75 ml  Output              650 ml  Net           353.75 ml   Filed Weights   07/01/17 1842  Weight: 40.8 kg (90 lb)    Examination:  General exam: very deconditioned and ill looking appearing E ENT: mild pallor, no icterus, neck with trach stoma in place.  Respiratory system: No wheezing, rales or rhonchi. Respiratory effort normal. Cardiovascular system: S1 & S2 heard, RRR. No JVD, murmurs, rubs, gallops or clicks. No pedal edema. Gastrointestinal system: Abdomen is nondistended, soft. Tender to deep palpation at the lower quadrants. No organomegaly or masses felt. Normal bowel  sounds heard. Central nervous system: Alert and oriented. No focal neurological deficits. Extremities: Symmetric 5 x 5 power. Skin: No rashes, lesions or ulcers     Data Reviewed: I have personally reviewed following labs and imaging studies  CBC:  Recent Labs Lab 07/01/17 1849 07/02/17 0541 07/03/17 0521 07/04/17 0522  WBC 9.3 9.1 9.0 8.7  NEUTROABS  --   --  6.9 5.9  HGB 12.9 11.5* 12.3 12.8  HCT 37.8 33.4* 36.6 37.1  MCV 94.7 92.8  94.8 92.1  PLT 323 286 294 341   Basic Metabolic Panel:  Recent Labs Lab 07/01/17 1849 07/02/17 0541 07/03/17 0521 07/04/17 0522  NA 130* 135 131* 128*  K 4.7 3.8 3.9 3.4*  CL 99* 105 99* 95*  CO2 25 19* 21* 23  GLUCOSE 96 81 88 86  BUN 19 10 9  5*  CREATININE 0.91 0.65 0.50 0.51  CALCIUM 9.0 8.1* 8.3* 8.2*   GFR: Estimated Creatinine Clearance: 33.7 mL/min (by C-G formula based on SCr of 0.51 mg/dL). Liver Function Tests:  Recent Labs Lab 07/01/17 1849  AST 22  ALT 12*  ALKPHOS 62  BILITOT 0.7  PROT 7.6  ALBUMIN 3.7    Recent Labs Lab 07/01/17 1849  LIPASE 30   No results for input(s): AMMONIA in the last 168 hours. Coagulation Profile: No results for input(s): INR, PROTIME in the last 168 hours. Cardiac Enzymes: No results for input(s): CKTOTAL, CKMB, CKMBINDEX, TROPONINI in the last 168 hours. BNP (last 3 results) No results for input(s): PROBNP in the last 8760 hours. HbA1C: No results for input(s): HGBA1C in the last 72 hours. CBG: No results for input(s): GLUCAP in the last 168 hours. Lipid Profile: No results for input(s): CHOL, HDL, LDLCALC, TRIG, CHOLHDL, LDLDIRECT in the last 72 hours. Thyroid Function Tests:  Recent Labs  07/02/17 0541  TSH 2.449   Anemia Panel: No results for input(s): VITAMINB12, FOLATE, FERRITIN, TIBC, IRON, RETICCTPCT in the last 72 hours. Sepsis Labs:  Recent Labs Lab 07/01/17 2135  LATICACIDVEN 0.97    Recent Results (from the past 240 hour(s))  Culture, blood (routine x 2)     Status: None (Preliminary result)   Collection Time: 07/01/17  7:36 PM  Result Value Ref Range Status   Specimen Description BLOOD LEFT ANTECUBITAL  Final   Special Requests   Final    BOTTLES DRAWN AEROBIC AND ANAEROBIC Blood Culture adequate volume   Culture   Final    NO GROWTH 1 DAY Performed at [facility], [address], [place], [place] [address]    Report Status PENDING  Incomplete  Culture, blood (routine x 2)      Status: None (Preliminary result)   Collection Time: 07/01/17  8:20 PM  Result Value Ref Range Status   Specimen Description BLOOD LEFT FOREARM  Final   Special Requests   Final    BOTTLES DRAWN AEROBIC AND ANAEROBIC Blood Culture adequate volume   Culture   Final    NO GROWTH 2 DAYS Performed at [facility], [place] [address] [place], [place] [address]    Report Status PENDING  Incomplete  Urine Culture     Status: Abnormal   Collection Time: 07/01/17 11:32 PM  Result Value Ref Range Status   Specimen Description URINE, CLEAN CATCH  Final   Special Requests NONE  Final   Culture (A)  Final    >=100,000 COLONIES/mL AEROCOCCUS URINAE Standardized susceptibility testing for this organism is not available.  Performed at [facility], [place] [address], [place], [place] [address]    Report Status 07/04/2017 FINAL  Final         Radiology Studies: No results found.      Scheduled Meds: . aspirin EC  81 mg Oral QHS  . dorzolamide-timolol  1 drop Right Eye BID  . enoxaparin (LOVENOX) injection  30 mg Subcutaneous QHS  . lactose free nutrition  237 mL Oral TID BM  . sertraline  25 mg Oral Daily   Continuous Infusions: . cefTRIAXone (ROCEPHIN) IVPB 1 gram/50 mL D5W Stopped (07/03/17 2142)     LOS: 3 days       Debroah Shuttleworth Gerome Apley, MD [facility] Pager [phone]  If 7PM-7AM, please contact night-coverage [email or web address] Password [email or web address] 07/04/2017, 11:09 AM

## 2017-07-04 NOTE — Evaluation (Signed)
Physical Therapy Evaluation Patient Details Name: Kim Hardy MRN: [id number] DOB: 1931/06/20 Today's Date: 07/04/2017   History of Present Illness  83 yo female admitted with UTI, sepsis. Hx of laryngeal cancer, lung cancer, tracheostomy  Clinical Impression  On eval, pt required Min assist for mobility. She walked ~15 feet in room. Gait is antalgic-pt reports R LE pain during ambulation. Discussed d/c plan-pt would like to return home however she does not feel she can manage at home alone in her current condition. Pt presents with general weakness, decreased activity tolerance, and impaired gait and balance. Recommend ST rehab at SNF. Will follow and progress activity as tolerated.     Follow Up Recommendations SNF    Equipment Recommendations  Rolling walker with 5" wheels (youth height)    Recommendations for Other Services       Precautions / Restrictions Precautions Precautions: Fall Restrictions Weight Bearing Restrictions: No      Mobility  Bed Mobility Overal bed mobility: Needs Assistance Bed Mobility: Supine to Sit;Sit to Supine     Supine to sit: Supervision Sit to supine: Supervision   General bed mobility comments: for safety, lines  Transfers Overall transfer level: Needs assistance   Transfers: Sit to/from Stand Sit to Stand: Min guard         General transfer comment: close guard for safety. Pt is unsteady without UE support.   Ambulation/Gait Ambulation/Gait assistance: Min assist Ambulation Distance (Feet): 15 Feet Assistive device:  (furniture walked) Gait Pattern/deviations: Step-to pattern;Antalgic     General Gait Details: Assist to stabilize throughout short distance. Gait is antalgic. Pt is unsteady.  Stairs            Wheelchair Mobility    Modified Rankin (Stroke Patients Only)       Balance Overall balance assessment: Needs assistance           Standing balance-Leahy Scale: Fair                                Pertinent Vitals/Pain Pain Assessment: Faces Faces Pain Scale: Hurts little more Pain Location: R LE with activity Pain Descriptors / Indicators: Sore Pain Intervention(s): Limited activity within patient's tolerance;Repositioned    Home Living Family/patient expects to be discharged to:: Unsure Living Arrangements: Alone   Type of Home: House       Home Layout: One level Home Equipment: None      Prior Function Level of Independence: Independent               Hand Dominance        Extremity/Trunk Assessment   Upper Extremity Assessment Upper Extremity Assessment: Defer to OT evaluation    Lower Extremity Assessment Lower Extremity Assessment: Generalized weakness    Cervical / Trunk Assessment Cervical / Trunk Assessment: Kyphotic  Communication   Communication: No difficulties  Cognition Arousal/Alertness: Awake/alert Behavior During Therapy: WFL for tasks assessed/performed Overall Cognitive Status: Difficult to assess                                 General Comments: appears [facility] during session      General Comments      Exercises     Assessment/Plan    PT Assessment Patient needs continued PT services  PT Problem List Decreased strength;Decreased mobility;Decreased activity tolerance;Decreased balance;Decreased knowledge of use of DME;Pain  PT Treatment Interventions DME instruction;Therapeutic activities;Gait training;Therapeutic exercise;Patient/family education;Functional mobility training;Balance training    PT Goals (Current goals can be found in the Care Plan section)  Acute Rehab PT Goals Patient Stated Goal: to regain PLOF PT Goal Formulation: With patient Time For Goal Achievement: 07/18/17 Potential to Achieve Goals: Good    Frequency Min 3X/week   Barriers to discharge        Co-evaluation               AM-PAC PT "6 Clicks" Daily Activity  Outcome Measure Difficulty turning over  in bed (including adjusting bedclothes, sheets and blankets)?: A Little Difficulty moving from lying on back to sitting on the side of the bed? : A Little Difficulty sitting down on and standing up from a chair with arms (e.g., wheelchair, bedside commode, etc,.)?: A Little Help needed moving to and from a bed to chair (including a wheelchair)?: A Little Help needed walking in hospital room?: A Little Help needed climbing 3-5 steps with a railing? : A Little 6 Click Score: 18    End of Session   Activity Tolerance: Patient tolerated treatment well Patient left: in bed;with call bell/phone within reach   PT Visit Diagnosis: Muscle weakness (generalized) (M62.81);Difficulty in walking, not elsewhere classified (R26.2)    Time: 6415-8309 PT Time Calculation (min) (ACUTE ONLY): 12 min   Charges:   PT Evaluation $PT Eval Low Complexity: 1 Procedure     PT G Codes:          Weston Anna, MPT Pager: [phone]

## 2017-07-04 NOTE — Care Management Important Message (Signed)
Important Message  Patient Details  Name: Kim Hardy MRN: [id number] Date of Birth: 03-18-1931   Medicare Important Message Given:  Yes    Erenest Rasher, RN 07/04/2017, 11:17 AM

## 2017-07-04 NOTE — Care Management Note (Signed)
Case Management Note  Patient Details  Name: KESHONDA MONSOUR MRN: [id number] Date of Birth: Apr 02, 1931  Subjective/Objective:   UTI, lung cancer, colitis, anorexia, osteoarthritis of hip                 Action/Plan: Discharge Planning: NCM spoke to pt and states she lives at home alone. Her Niece, Tylene Fantasia will assist her at home as needed. States she was driving to her appts. States she does not have RW or bedside commode at home. Offered choice for HH/list provided. Pt agreeable to SNF for rehab as recommended by PT. Waiting OT recommendations. CSW referral for SNF.   PCP BURNS, Claudina Lick MD  Expected Discharge Date:                Expected Discharge Plan:  [place]  In-House Referral:  Clinical Social Work  Discharge planning Services  CM Consult  Post Acute Care Choice:  NA Choice offered to:  NA  DME Arranged:  N/A DME Agency:  NA  HH Arranged:  NA HH Agency:  NA  Status of Service:  Completed, signed off  If discussed at [place] of Stay Meetings, dates discussed:    Additional Comments:  Erenest Rasher, RN 07/04/2017, 11:24 AM

## 2017-07-04 NOTE — Progress Notes (Signed)
Chaplain saw pt in response to spiritual care consult re: Advance Directives.    Provided education around advance directives.  Pt stated that she did not wish to complete paperwork at this time, as she is feeling sick.  Chaplain informed pt of how to contact spiritual care or other notary resources in hospital to complete paperwork  Showed pt which parts of paperwork could be completed with out notary presence.  Pt states she may discharge tomorrow.  Informed pt that she can bring paperwork back to hospital for chaplain to notarize.     In conversation stated that she did not want to go home alone, stating she "is sick" and feels alone and scared.   States her family comes to check on her, but they have children and she understands their time is valuable.        [facility] / [place], MDiv

## 2017-07-05 DIAGNOSIS — E43 Unspecified severe protein-calorie malnutrition: Secondary | ICD-10-CM

## 2017-07-05 DIAGNOSIS — E871 Hypo-osmolality and hyponatremia: Secondary | ICD-10-CM

## 2017-07-05 LAB — BASIC METABOLIC PANEL
Anion gap: 13 (ref 5–15)
BUN: 12 mg/dL (ref 6–20)
CALCIUM: 8.3 mg/dL — AB (ref 8.9–10.3)
CHLORIDE: 91 mmol/L — AB (ref 101–111)
CO2: 22 mmol/L (ref 22–32)
CREATININE: 0.75 mg/dL (ref 0.44–1.00)
GFR calc Af Amer: >60 mL/min (ref >60)
GFR calc non Af Amer: >60 mL/min (ref >60)
Glucose, Bld: 76 mg/dL (ref 65–99)
Potassium: 3.4 mmol/L — ABNORMAL LOW (ref 3.5–5.1)
SODIUM: 126 mmol/L — AB (ref 135–145)

## 2017-07-05 MED ORDER — ALPRAZOLAM 0.5 MG PO TABS
0.5000 mg | ORAL_TABLET | Freq: Three times a day (TID) | ORAL | Status: DC | PRN
  Administered 2017-07-05: 0.5 mg via ORAL
  Filled 2017-07-05: qty 1

## 2017-07-05 MED ORDER — SODIUM CHLORIDE 3 % IN NEBU
4.0000 mL | INHALATION_SOLUTION | Freq: Two times a day (BID) | RESPIRATORY_TRACT | Status: DC
  Administered 2017-07-05 – 2017-07-07 (×4): 4 mL via RESPIRATORY_TRACT
  Filled 2017-07-05 (×5): qty 4

## 2017-07-05 MED ORDER — SODIUM CHLORIDE 1 G PO TABS
1.0000 g | ORAL_TABLET | Freq: Two times a day (BID) | ORAL | Status: DC
  Administered 2017-07-05 – 2017-07-07 (×4): 1 g via ORAL
  Filled 2017-07-05 (×4): qty 1

## 2017-07-05 MED ORDER — GUAIFENESIN 100 MG/5ML PO SOLN
5.0000 mL | Freq: Four times a day (QID) | ORAL | Status: DC
  Administered 2017-07-05 – 2017-07-06 (×4): 100 mg via ORAL
  Filled 2017-07-05 (×6): qty 10

## 2017-07-05 MED ORDER — PROMETHAZINE HCL 25 MG/ML IJ SOLN
12.5000 mg | Freq: Four times a day (QID) | INTRAMUSCULAR | Status: DC | PRN
  Administered 2017-07-05: 12.5 mg via INTRAVENOUS
  Filled 2017-07-05: qty 1

## 2017-07-05 NOTE — Progress Notes (Signed)
Notified physician that patient BP 184/105 and heart rate consistently 130s - 160s over the past hour.  Patient has continuous nausea.  Physician putting in order for 12 lead EKG and phenergan.

## 2017-07-05 NOTE — NC FL2 (Signed)
[place] LEVEL OF CARE SCREENING TOOL     IDENTIFICATION  Patient Name: Kim Hardy Birthdate: [birth year] Sex: female Admission Date (Current Location): 07/01/2017  [facility] [place] Number:  Herbalist and Address:  [facility],  [place], [place]      Provider Number: [id number]  Attending Physician Name and Address:  Tawni Millers  Relative Name and Phone Number:       Current Level of Care: Hospital Recommended Level of Care: [place] Prior Approval Number:    Date Approved/Denied:   PASRR Number: [id number]  Discharge Plan: SNF    Current Diagnoses: Patient Active Problem List   Diagnosis Date Noted  . Protein-calorie malnutrition, severe 07/04/2017  . Nausea 07/02/2017  . Osteoarthritis of right hip 07/02/2017  . UTI (urinary tract infection) 07/01/2017  . Elevated blood pressure reading 04/14/2017  . PVD (peripheral vascular disease) ([place]) 09/20/2016  . Bilateral hand numbness 09/11/2016  . Right leg pain 08/12/2016  . Hypertrophic toenail 08/12/2016  . Anorexia 07/10/2016  . Loss of weight 07/10/2016  . Depression 07/10/2016  . Hypertriglyceridemia 03/08/2016  . Colitis 03/08/2016  . laryngeal ca   . Lung cancer ([place])     Orientation RESPIRATION BLADDER Height & Weight     Self, Time, Situation, Place  No oxygen. Has old trach site- stoma is open hole but no suctioning/oxygen required Continent Weight: 90 lb (40.8 kg) Height:  5' (152.4 cm)  BEHAVIORAL SYMPTOMS/MOOD NEUROLOGICAL BOWEL NUTRITION STATUS      Continent Diet (Heart Healthy)  AMBULATORY STATUS COMMUNICATION OF NEEDS Skin   Limited Assist Verbally Normal                       Personal Care Assistance Level of Assistance  Bathing, Dressing Bathing Assistance: Limited assistance   Dressing Assistance: Limited assistance     Functional Limitations Info  Speech (Whispers due to stoma)      Speech Info:  (has old trach (stoma)- whispers)    [place]  PT (By licensed PT), OT (By licensed OT)     PT Frequency: 5 OT Frequency: 5            Contractures Contractures Info: Not present    Additional Factors Info  Code Status, Allergies Code Status Info: Full code Allergies Info: Anesthetics, Amide, Boniva, Cipro, Levofloxacin, metronidazole, Statins, Sulfa Antibiotics           Current Medications (07/05/2017):  This is the current hospital active medication list Current Facility-Administered Medications  Medication Dose Route Frequency Provider Last Rate Last Dose  . acetaminophen (TYLENOL) tablet 650 mg  650 mg Oral Q6H PRN Fuller Plan A, MD       Or  . acetaminophen (TYLENOL) suppository 650 mg  650 mg Rectal Q6H PRN Fuller Plan A, MD      . aspirin EC tablet 81 mg  81 mg Oral QHS Minda Ditto, RPH   81 mg at 07/03/17 2112  . cefTRIAXone (ROCEPHIN) 1 g in dextrose 5 % 50 mL IVPB  1 g Intravenous Q24H Norval Morton, MD   Stopped at 07/04/17 2256  . dorzolamide-timolol (COSOPT) 22.3-6.8 MG/ML ophthalmic solution 1 drop  1 drop Right Eye BID Fuller Plan A, MD   1 drop at 07/05/17 0945  . enoxaparin (LOVENOX) injection 30 mg  30 mg Subcutaneous QHS Smith, Rondell A, MD   30 mg at  07/04/17 2226  . famotidine (PEPCID) tablet 20 mg  20 mg Oral Daily Bryla Burek, Jimmy Picket, MD   20 mg at 07/05/17 0944  . iopamidol (ISOVUE-300) 61 % injection 100 mL  100 mL Intravenous Once PRN Lacretia Leigh, MD      . lactose free nutrition (Boost) liquid 237 mL  237 mL Oral TID BM Smith, Rondell A, MD   237 mL at 07/04/17 1344  . ondansetron (ZOFRAN) tablet 4 mg  4 mg Oral Q6H PRN Fuller Plan A, MD   4 mg at 07/04/17 1616   Or  . ondansetron (ZOFRAN) injection 4 mg  4 mg Intravenous Q6H PRN Fuller Plan A, MD   4 mg at 07/05/17 0538  . sertraline (ZOLOFT) tablet 25 mg  25 mg Oral Daily Fuller Plan A, MD   25 mg at 07/05/17 0943  . sodium  chloride tablet 1 g  1 g Oral BID WC Merinda Victorino, Jimmy Picket, MD      . traMADol Veatrice Bourbon) tablet 50 mg  50 mg Oral Q12H PRN Norval Morton, MD   50 mg at 07/02/17 0037     Discharge Medications: Please see discharge summary for a list of discharge medications.  Relevant Imaging Results:  Relevant Lab Results:   Additional Information SSN:  [id number] [place]

## 2017-07-05 NOTE — Progress Notes (Signed)
PROGRESS NOTE    Kim Hardy  WNI:[id number] DOB: November 05, 1931 DOA: 07/01/2017 PCP: [name], MD    Brief Narrative:   83 year old female who presented with right hip pain. Patient is known to have laryngeal and lung cancer, status post tracheostomy. Worsening right hip pain over last 4 weeks prior to hospitalization, radiation down to the right lower extremity, progressive to the point where patient was unable to ambulate. On initial physical examination, patient was afebrile, respiratory 25, blood pressure 85/67. Dry mucous membranes, lungs were clear to auscultation bilaterally, heart S1-S2 present and rhythmic, the abdomen was tender in lower quadrants, no lower extremity edema. Urinalysis positive for infection. Patient was admitted to the hospital with working diagnosis of sepsis due to urinary tract infection. Noted worsening weakness and hyponatremia. Placed on fluid restriction, patient will need SNF at discharge.  Assessment & Plan:   Principal Problem:   UTI (urinary tract infection) Active Problems:   Lung cancer (HCC)   Colitis   Anorexia   Depression   Nausea   Osteoarthritis of right hip   Protein-calorie malnutrition, severe   1. Sepsis due to urine tract infection, present on admission. Continue antibiotic therapyCeftriaxone IV #3, urine culture positive to aerococcus. Pending sensitivities. Patient with nausea and poor oral intake, very deconditioned, will plan to discharge to SNF for rehab.   2. History of laryngeal and lung cancer. Tracheostomy stoma in place, suction as needed per patient.   3. Depression. Continue with sertraline.   4. Worsening hyponatremia, suspected SIADH. Continue worsening hyponatremia, now down to 126, noted low serum osmolality, with high urine osmolality and high urine Na, clinically patient looks euvolemic, will continue fluid restriction and will add salt tablets, will follow renal panel in am.   DVT prophylaxis:enoxaparin    Code Status:Full  Family Communication:No family at the bedside  Disposition Plan: Home    Consultants:    Procedures:    Antimicrobials  Ceftriaxone     Subjective: Patient continue to be very weak and deconditioned ,very poor oral intake, poor appetite, no nausea or vomiting, Positive generalized weakness.   Objective: Vitals:   07/04/17 2010 07/04/17 2230 07/05/17 0536 07/05/17 0827  BP:  (!) 150/67 138/67   Pulse: 90 (!) 105 (!) 107   Resp: 18 18 20    Temp:  98.6 F (37 C) 98.8 F (37.1 C)   TempSrc:  Oral Oral   SpO2: 96% 99% 97% 98%  Weight:      Height:        Intake/Output Summary (Last 24 hours) at 07/05/17 1054 Last data filed at 07/04/17 2231  Gross per 24 hour  Intake                0 ml  Output                3 ml  Net               -3 ml   Filed Weights   07/01/17 1842  Weight: 40.8 kg (90 lb)    Examination:  General exam: deconditioned E ENT: mild pallor, no icterus, oral mucosa moist. Trach stoma in place.  Respiratory system: Clear to auscultation. Respiratory effort normal. Cardiovascular system: S1 & S2 heard, RRR. No JVD, murmurs, rubs, gallops or clicks. No pedal edema. Gastrointestinal system: Abdomen is nondistended, soft and nontender. No organomegaly or masses felt. Normal bowel sounds heard. Central nervous system: Alert and oriented. No focal neurological deficits. Extremities: Symmetric  5 x 5 power. Skin: No rashes, lesions or ulcers.   Data Reviewed: I have personally reviewed following labs and imaging studies  CBC:  Recent Labs Lab 07/01/17 1849 07/02/17 0541 07/03/17 0521 07/04/17 0522  WBC 9.3 9.1 9.0 8.7  NEUTROABS  --   --  6.9 5.9  HGB 12.9 11.5* 12.3 12.8  HCT 37.8 33.4* 36.6 37.1  MCV 94.7 92.8 94.8 92.1  PLT 323 286 294 973   Basic Metabolic Panel:  Recent Labs Lab 07/01/17 1849 07/02/17 0541 07/03/17 0521 07/04/17 0522 07/05/17 0549  NA 130* 135 131* 128* 126*  K 4.7 3.8 3.9  3.4* 3.4*  CL 99* 105 99* 95* 91*  CO2 25 19* 21* 23 22  GLUCOSE 96 81 88 86 76  BUN 19 10 9  5* 12  CREATININE 0.91 0.65 0.50 0.51 0.75  CALCIUM 9.0 8.1* 8.3* 8.2* 8.3*   GFR: Estimated Creatinine Clearance: 33.7 mL/min (by C-G formula based on SCr of 0.75 mg/dL). Liver Function Tests:  Recent Labs Lab 07/01/17 1849  AST 22  ALT 12*  ALKPHOS 62  BILITOT 0.7  PROT 7.6  ALBUMIN 3.7    Recent Labs Lab 07/01/17 1849  LIPASE 30   No results for input(s): AMMONIA in the last 168 hours. Coagulation Profile: No results for input(s): INR, PROTIME in the last 168 hours. Cardiac Enzymes: No results for input(s): CKTOTAL, CKMB, CKMBINDEX, TROPONINI in the last 168 hours. BNP (last 3 results) No results for input(s): PROBNP in the last 8760 hours. HbA1C: No results for input(s): HGBA1C in the last 72 hours. CBG: No results for input(s): GLUCAP in the last 168 hours. Lipid Profile: No results for input(s): CHOL, HDL, LDLCALC, TRIG, CHOLHDL, LDLDIRECT in the last 72 hours. Thyroid Function Tests: No results for input(s): TSH, T4TOTAL, FREET4, T3FREE, THYROIDAB in the last 72 hours. Anemia Panel: No results for input(s): VITAMINB12, FOLATE, FERRITIN, TIBC, IRON, RETICCTPCT in the last 72 hours. Sepsis Labs:  Recent Labs Lab 07/01/17 2135  LATICACIDVEN 0.97    Recent Results (from the past 240 hour(s))  Culture, blood (routine x 2)     Status: None (Preliminary result)   Collection Time: 07/01/17  7:36 PM  Result Value Ref Range Status   Specimen Description BLOOD LEFT ANTECUBITAL  Final   Special Requests   Final    BOTTLES DRAWN AEROBIC AND ANAEROBIC Blood Culture adequate volume   Culture   Final    NO GROWTH 3 DAYS Performed at [facility], [address], [place], [place] [address]    Report Status PENDING  Incomplete  Culture, blood (routine x 2)     Status: None (Preliminary result)   Collection Time: 07/01/17  8:20 PM  Result Value Ref Range Status    Specimen Description BLOOD LEFT FOREARM  Final   Special Requests   Final    BOTTLES DRAWN AEROBIC AND ANAEROBIC Blood Culture adequate volume   Culture   Final    NO GROWTH 4 DAYS Performed at [facility], [place] [address], [place], [place] [address]    Report Status PENDING  Incomplete  Urine Culture     Status: Abnormal   Collection Time: 07/01/17 11:32 PM  Result Value Ref Range Status   Specimen Description URINE, CLEAN CATCH  Final   Special Requests NONE  Final   Culture (A)  Final    >=100,000 COLONIES/mL AEROCOCCUS URINAE Standardized susceptibility testing for this organism is not available. Performed at [facility]  [facility], [place] [address] [place], [place] [address]    Report Status 07/04/2017 FINAL  Final         Radiology Studies: No results found.      Scheduled Meds: . aspirin EC  81 mg Oral QHS  . dorzolamide-timolol  1 drop Right Eye BID  . enoxaparin (LOVENOX) injection  30 mg Subcutaneous QHS  . famotidine  20 mg Oral Daily  . lactose free nutrition  237 mL Oral TID BM  . sertraline  25 mg Oral Daily   Continuous Infusions: . cefTRIAXone (ROCEPHIN) IVPB 1 gram/50 mL D5W Stopped (07/04/17 2256)     LOS: 4 days        Maryem Shuffler Gerome Apley, MD [facility] Pager [phone]  If 7PM-7AM, please contact night-coverage [email or web address] Password [email or web address] 07/05/2017, 10:54 AM

## 2017-07-05 NOTE — Progress Notes (Signed)
Notified physician that patient HR continues to sustain in the 140s and above for the last two hours.

## 2017-07-05 NOTE — Clinical Social Work Placement (Signed)
   CLINICAL SOCIAL WORK PLACEMENT  NOTE  Date:  07/05/2017  Patient Details  Name: Kim Hardy MRN: [id number] Date of Birth: [birth year]  Clinical Social Work is seeking post-discharge placement for this patient at the [place] level of care (*CSW will initial, date and re-position this form in  chart as items are completed):  Yes   Patient/family provided with [place] Work Department's list of facilities offering this level of care within the geographic area requested by the patient (or if unable, by the patient's family).  Yes   Patient/family informed of their freedom to choose among providers that offer the needed level of care, that participate in Medicare, Medicaid or managed care program needed by the patient, have an available bed and are willing to accept the patient.  Yes   Patient/family informed of [facility]'s ownership interest in [facility] and [facility], as well as of the fact that they are under no obligation to receive care at these facilities.  PASRR submitted to EDS on 07/05/17     PASRR number received on 07/05/17     Existing PASRR number confirmed on       FL2 transmitted to all facilities in geographic area requested by pt/family on 07/05/17     FL2 transmitted to all facilities within larger geographic area on       Patient informed that his/her managed care company has contracts with or will negotiate with certain facilities, including the following:            Patient/family informed of bed offers received.  Patient chooses bed at       Physician recommends and patient chooses bed at      Patient to be transferred to   on  .  Patient to be transferred to facility by Ambulance [facility])     Patient family notified on   of transfer.  Name of family member notified:        PHYSICIAN Please prepare priority discharge summary, including medications, Please prepare prescriptions, Please sign FL2      Additional Comment:    _______________________________________________ Williemae Area, LCSW 07/05/2017, 11:45 AM

## 2017-07-05 NOTE — Clinical Social Work Note (Signed)
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  (S) No (Not right now- wants rehab) Need for family participation in patient care:  No ([place]) (Pt feels she can manage her own decisions)  Care giving concerns:  Patient lives alone and reports that she has been feeling very weak. She has an old trach which does not require suctioning or oxygen. Wants short term SNF   Social Worker assessment / plan:  CSW met with patient today to discuss PT's recommendation for short term SNF. She is in agreement with this as she feels weak and states she is unable to eat.  Patient lives alone and admits that she is too weak to manage at home along. She has a niece Kim Hardy but she states she is normally self-sufficient of her ADLs.  Patient provided list of SNF's and discussed SNF placement process. Patient prefers placement in [place] if possible- at [facility].  Fl2 will be initiated and sent to area SNF's for review.  Per MD- her sodium is low at this time but he is hoping she will be stable for d/c tomorrow.  Active bed search in place.  CSW spoke with  Administrator of [facility] and Rehab (admissions director is off this weekend) and notified of referral.  He stated that he would check referral when received.  Pt is able to communicate via whispers; stoma is covered. She is alert and oriented.  SW also spoke with patient about possible interest in completing an Forensic scientist. She is not wanting to do so at this time.  Will ask MD to consider Palliative referral at the nursing center for follow up.    Employment status:  Retired Nurse, adult PT Recommendations:  [place] / Referral to community resources:  [place]  Patient/Family's Response to care:  Pt states she is feeling a little better but not ready to leave the hospital.  Pt states she is really weak and worried that she is unable to eat.  She is hopeful to return home after rehab.    Patient/Family's Understanding of and Emotional Response to Diagnosis, Current Treatment, and Prognosis:  Patient is alert and oriented. States she manages her own health care decisions and feels she has a good understanding of her current medical condition. Patient noted to be very quiet and speaks only short sentences due to hx of trach and presence of stoma.   Emotional Assessment Appearance:  Appears stated age Attitude/Demeanor/Rapport:   (Appropriate for age and situation) Affect (typically observed):  Accepting, Calm, Pleasant Orientation:  Oriented to Self, Oriented to Place, Oriented to  Time, Oriented to Situation Alcohol / Substance use:  Other (Hx of smoking; none now) Psych involvement (Current and /or in the community):  No (Comment)  Discharge Needs  Concerns to be addressed:  Care Coordination Readmission within the last 30 days:  No Current discharge risk:  Lives alone Barriers to Discharge:  Continued Medical Work up   Kim Bane T, LCSW 07/05/2017, 11:27 AM

## 2017-07-05 NOTE — NC FL2 (Deleted)
[place] LEVEL OF CARE SCREENING TOOL     IDENTIFICATION  Patient Name: Kim Hardy Birthdate: [birth year] Sex: female Admission Date (Current Location): 07/01/2017  [facility] and [place] Number:  Herbalist and Address:  [facility],  [place], [place]      Provider Number: [id number]  Attending Physician Name and Address:  Tawni Millers  Relative Name and Phone Number:       Current Level of Care: Hospital Recommended Level of Care: [place] Prior Approval Number:    Date Approved/Denied:   PASRR Number: [id number]  Discharge Plan: SNF    Current Diagnoses: Patient Active Problem List   Diagnosis Date Noted  . Protein-calorie malnutrition, severe 07/04/2017  . Nausea 07/02/2017  . Osteoarthritis of right hip 07/02/2017  . UTI (urinary tract infection) 07/01/2017  . Elevated blood pressure reading 04/14/2017  . PVD (peripheral vascular disease) ([place]) 09/20/2016  . Bilateral hand numbness 09/11/2016  . Right leg pain 08/12/2016  . Hypertrophic toenail 08/12/2016  . Anorexia 07/10/2016  . Loss of weight 07/10/2016  . Depression 07/10/2016  . Hypertriglyceridemia 03/08/2016  . Colitis 03/08/2016  . laryngeal ca   . Lung cancer ([place])     Orientation RESPIRATION BLADDER Height & Weight     Self, Time, Situation, Place  Normal Continent Weight: 90 lb (40.8 kg) Height:  5' (152.4 cm)  BEHAVIORAL SYMPTOMS/MOOD NEUROLOGICAL BOWEL NUTRITION STATUS      Continent Diet (Heart Healthy)  AMBULATORY STATUS COMMUNICATION OF NEEDS Skin   Limited Assist Verbally Normal                       Personal Care Assistance Level of Assistance  Bathing, Dressing Bathing Assistance: Limited assistance   Dressing Assistance: Limited assistance     Functional Limitations Info  Speech (Whispers due to stoma)     Speech Info:  (has old trach (stoma)- whispers)    SPECIAL CARE FACTORS  FREQUENCY  PT (By licensed PT), OT (By licensed OT)     PT Frequency: 5 OT Frequency: 5            Contractures Contractures Info: Not present    Additional Factors Info  Code Status, Allergies Code Status Info: Full code Allergies Info: Anesthetics, Amide, Boniva, Cipro, Levofloxacin, metronidazole, Statins, Sulfa Antibiotics           Current Medications (07/05/2017):  This is the current hospital active medication list Current Facility-Administered Medications  Medication Dose Route Frequency Provider Last Rate Last Dose  . acetaminophen (TYLENOL) tablet 650 mg  650 mg Oral Q6H PRN Fuller Plan A, MD       Or  . acetaminophen (TYLENOL) suppository 650 mg  650 mg Rectal Q6H PRN Fuller Plan A, MD      . aspirin EC tablet 81 mg  81 mg Oral QHS Minda Ditto, RPH   81 mg at 07/03/17 2112  . cefTRIAXone (ROCEPHIN) 1 g in dextrose 5 % 50 mL IVPB  1 g Intravenous Q24H Norval Morton, MD   Stopped at 07/04/17 2256  . dorzolamide-timolol (COSOPT) 22.3-6.8 MG/ML ophthalmic solution 1 drop  1 drop Right Eye BID Fuller Plan A, MD   1 drop at 07/05/17 0945  . enoxaparin (LOVENOX) injection 30 mg  30 mg Subcutaneous QHS Smith, Rondell A, MD   30 mg at 07/04/17 2226  . famotidine (PEPCID) tablet 20 mg  20 mg Oral  Daily Arrien, Jimmy Picket, MD   20 mg at 07/05/17 0944  . iopamidol (ISOVUE-300) 61 % injection 100 mL  100 mL Intravenous Once PRN Lacretia Leigh, MD      . lactose free nutrition (Boost) liquid 237 mL  237 mL Oral TID BM Smith, Rondell A, MD   237 mL at 07/04/17 1344  . ondansetron (ZOFRAN) tablet 4 mg  4 mg Oral Q6H PRN Fuller Plan A, MD   4 mg at 07/04/17 1616   Or  . ondansetron (ZOFRAN) injection 4 mg  4 mg Intravenous Q6H PRN Fuller Plan A, MD   4 mg at 07/05/17 0538  . sertraline (ZOLOFT) tablet 25 mg  25 mg Oral Daily Fuller Plan A, MD   25 mg at 07/05/17 0943  . sodium chloride tablet 1 g  1 g Oral BID WC Arrien, Jimmy Picket, MD      . traMADol  Veatrice Bourbon) tablet 50 mg  50 mg Oral Q12H PRN Norval Morton, MD   50 mg at 07/02/17 6579     Discharge Medications: Please see discharge summary for a list of discharge medications.  Relevant Imaging Results:  Relevant Lab Results:   Additional Information SSN:  [id number], [place]

## 2017-07-06 DIAGNOSIS — N179 Acute kidney failure, unspecified: Secondary | ICD-10-CM

## 2017-07-06 LAB — BASIC METABOLIC PANEL
Anion gap: 10 (ref 5–15)
BUN: 22 mg/dL — ABNORMAL HIGH (ref 6–20)
CHLORIDE: 92 mmol/L — AB (ref 101–111)
CO2: 23 mmol/L (ref 22–32)
CREATININE: 1.1 mg/dL — AB (ref 0.44–1.00)
Calcium: 8.1 mg/dL — ABNORMAL LOW (ref 8.9–10.3)
GFR, EST AFRICAN AMERICAN: 52 mL/min — AB (ref >60)
GFR, EST NON AFRICAN AMERICAN: 45 mL/min — AB (ref >60)
Glucose, Bld: 159 mg/dL — ABNORMAL HIGH (ref 65–99)
Potassium: 3.2 mmol/L — ABNORMAL LOW (ref 3.5–5.1)
SODIUM: 125 mmol/L — AB (ref 135–145)

## 2017-07-06 LAB — CULTURE, BLOOD (ROUTINE X 2)
Culture: NO GROWTH
SPECIAL REQUESTS: ADEQUATE

## 2017-07-06 MED ORDER — IPRATROPIUM-ALBUTEROL 0.5-2.5 (3) MG/3ML IN SOLN
RESPIRATORY_TRACT | Status: AC
  Administered 2017-07-06: 3 mL
  Filled 2017-07-06: qty 3

## 2017-07-06 MED ORDER — ENOXAPARIN SODIUM 30 MG/0.3ML ~~LOC~~ SOLN
20.0000 mg | Freq: Every day | SUBCUTANEOUS | Status: DC
  Administered 2017-07-06: 20 mg via SUBCUTANEOUS
  Filled 2017-07-06 (×2): qty 0.2
  Filled 2017-07-06: qty 0.3

## 2017-07-06 MED ORDER — POTASSIUM CHLORIDE 20 MEQ/15ML (10%) PO SOLN
40.0000 meq | Freq: Once | ORAL | Status: AC
  Administered 2017-07-06: 40 meq via ORAL
  Filled 2017-07-06: qty 30

## 2017-07-06 MED ORDER — SODIUM CHLORIDE 0.9 % IV SOLN
INTRAVENOUS | Status: DC
  Administered 2017-07-06: 10:00:00 via INTRAVENOUS

## 2017-07-06 MED ORDER — IPRATROPIUM-ALBUTEROL 0.5-2.5 (3) MG/3ML IN SOLN
RESPIRATORY_TRACT | Status: AC
  Filled 2017-07-06: qty 3

## 2017-07-06 NOTE — Progress Notes (Addendum)
CSW following for disposition and DC planning. Plan for SNF at DC. Pt requested [facility] - facility has accepted pt - CSW spoke with admissions to update them.  Spoke with Niece Santiago Glad via phone. She states she would like to visit other facilities before deciding on [facility]. Provided list of facilities which have made bed offers.  Discussed that facility decision is pt's but that niece will discuss further with her.   Will continue following and assist with transfer to SNF when pt medically stable.   Sharren Bridge, MSW, LCSW Clinical Social Work 07/06/2017 [phone]  13:52 Spoke with pt who states now prefers [facility] for SNF. CSW left message for admissions. Per MD not stable for DC today- will continue to follow.

## 2017-07-06 NOTE — Progress Notes (Signed)
PROGRESS NOTE    Kim Hardy  DVV:[id number] DOB: 1931/04/08 DOA: 07/01/2017 PCP: [name], MD    Brief Narrative:  83 year old female who presented with right hip pain. Patient is known to have laryngeal and lung cancer, status post tracheostomy. Worsening right hip pain over last 4 weeks prior to hospitalization, radiation down to the right lower extremity, progressive to the point where patient was unable to ambulate. On initial physical examination, patient was afebrile, respiratory 25, blood pressure 85/67. Dry mucous membranes, lungs were clear to auscultation bilaterally, heart S1-S2 present and rhythmic, the abdomen was tender in lower quadrants, no lower extremity edema. Urinalysis positive for infection. Patient was admitted to the hospital with working diagnosis of sepsis due to urinary tract infection. Noted worsening weakness and hyponatremia. Placed on fluid restriction, patient will need SNF at discharge. Needed aggressive trach care due to mucous plugging.    Assessment & Plan:   Principal Problem:   UTI (urinary tract infection) Active Problems:   Lung cancer (HCC)   Colitis   Anorexia   Depression   Nausea   Osteoarthritis of right hip   Protein-calorie malnutrition, severe  1. Sepsis due to urine tract infection, present on admission. Ceftriaxone IV #4, urine culture positive to aerococcus. Blood pressure has remained stable with MAP greater than 65 mmHg.   2. History of laryngeal and lung cancer sp tracheostomy with NEW impending respiratory failure due to mucous plugging. Patient had significant dyspnea and persistent tachycardia, very large and copious mucosus plugs were removed with deep suctioning. Patient placed on trach collar with humidity, continue chest pt and suctioning as needed.    3. Depression. Continue withsertraline, with good toleration.    4. Worsening hyponatremia, suspected SIADH. Serum Na down to 125, will continue fluid restriction  and sodium tablets, will add gentle hydration with isotonic saline and will follow on renal panel in am, patient clinically euvolemic and tolerating po well.  5. New AKI with Hypokalemia. Will replete K with KCl po, 40 meq. Follow renal panel in am, serum cr at 1.10 from 0.75 cw aki, will resume gentle hydration with saline IV. Avoid hypotension or nephrotoxic agents, will follow on urine output.   DVT prophylaxis:enoxaparin  Code Status:Full  Family Communication:No family at the bedside  Disposition Plan: Home    Consultants:    Procedures:    Antimicrobials  Ceftriaxone     Subjective: Patient had episode of respiratory distress yesterday due to mucous plugging, improved with deep suctioning. This am feeling better and improved appetite, no nausea or vomiting, no diarrhea. No chest pain.   Objective: Vitals:   07/06/17 0457 07/06/17 0502 07/06/17 0823 07/06/17 0825  BP:  (!) 110/44    Pulse: 89 (!) 104    Resp: 18 18    Temp:  98.8 F (37.1 C)    TempSrc:  Oral    SpO2: 93% 95% 94% 94%  Weight:      Height:        Intake/Output Summary (Last 24 hours) at 07/06/17 0955 Last data filed at 07/05/17 1201  Gross per 24 hour  Intake                0 ml  Output                0 ml  Net                0 ml   Filed Weights   07/01/17 1842  Weight: 40.8 kg (90 lb)    Examination:  General exam: deconditioned E ENT. Mild pallor, no icterus, stoma with no evident secreations Respiratory system: scattered proximal rhonchi, no wheezing, or rales. Respiratory effort normal. Cardiovascular system: S1 & S2 heard, RRR. No JVD, murmurs, rubs, gallops or clicks. No pedal edema. Gastrointestinal system: Abdomen is nondistended, soft and nontender. No organomegaly or masses felt. Normal bowel sounds heard. Central nervous system: Alert and oriented. No focal neurological deficits. Extremities: Symmetric 5 x 5 power. Skin: No rashes, lesions or  ulcers     Data Reviewed: I have personally reviewed following labs and imaging studies  CBC:  Recent Labs Lab 07/01/17 1849 07/02/17 0541 07/03/17 0521 07/04/17 0522  WBC 9.3 9.1 9.0 8.7  NEUTROABS  --   --  6.9 5.9  HGB 12.9 11.5* 12.3 12.8  HCT 37.8 33.4* 36.6 37.1  MCV 94.7 92.8 94.8 92.1  PLT 323 286 294 250   Basic Metabolic Panel:  Recent Labs Lab 07/02/17 0541 07/03/17 0521 07/04/17 0522 07/05/17 0549 07/06/17 0527  NA 135 131* 128* 126* 125*  K 3.8 3.9 3.4* 3.4* 3.2*  CL 105 99* 95* 91* 92*  CO2 19* 21* 23 22 23   GLUCOSE 81 88 86 76 159*  BUN 10 9 5* 12 22*  CREATININE 0.65 0.50 0.51 0.75 1.10*  CALCIUM 8.1* 8.3* 8.2* 8.3* 8.1*   GFR: Estimated Creatinine Clearance: 24.5 mL/min (A) (by C-G formula based on SCr of 1.1 mg/dL (H)). Liver Function Tests:  Recent Labs Lab 07/01/17 1849  AST 22  ALT 12*  ALKPHOS 62  BILITOT 0.7  PROT 7.6  ALBUMIN 3.7    Recent Labs Lab 07/01/17 1849  LIPASE 30   No results for input(s): AMMONIA in the last 168 hours. Coagulation Profile: No results for input(s): INR, PROTIME in the last 168 hours. Cardiac Enzymes: No results for input(s): CKTOTAL, CKMB, CKMBINDEX, TROPONINI in the last 168 hours. BNP (last 3 results) No results for input(s): PROBNP in the last 8760 hours. HbA1C: No results for input(s): HGBA1C in the last 72 hours. CBG: No results for input(s): GLUCAP in the last 168 hours. Lipid Profile: No results for input(s): CHOL, HDL, LDLCALC, TRIG, CHOLHDL, LDLDIRECT in the last 72 hours. Thyroid Function Tests: No results for input(s): TSH, T4TOTAL, FREET4, T3FREE, THYROIDAB in the last 72 hours. Anemia Panel: No results for input(s): VITAMINB12, FOLATE, FERRITIN, TIBC, IRON, RETICCTPCT in the last 72 hours. Sepsis Labs:  Recent Labs Lab 07/01/17 2135  LATICACIDVEN 0.97    Recent Results (from the past 240 hour(s))  Culture, blood (routine x 2)     Status: None (Preliminary result)    Collection Time: 07/01/17  7:36 PM  Result Value Ref Range Status   Specimen Description BLOOD LEFT ANTECUBITAL  Final   Special Requests   Final    BOTTLES DRAWN AEROBIC AND ANAEROBIC Blood Culture adequate volume   Culture   Final    NO GROWTH 3 DAYS Performed at [facility], [address] [place], [place] [address]    Report Status PENDING  Incomplete  Culture, blood (routine x 2)     Status: None (Preliminary result)   Collection Time: 07/01/17  8:20 PM  Result Value Ref Range Status   Specimen Description BLOOD LEFT FOREARM  Final   Special Requests   Final    BOTTLES DRAWN AEROBIC AND ANAEROBIC Blood Culture adequate volume   Culture   Final    NO GROWTH  4 DAYS Performed at [facility], [place] [address], [place], [place] [address]    Report Status PENDING  Incomplete  Urine Culture     Status: Abnormal   Collection Time: 07/01/17 11:32 PM  Result Value Ref Range Status   Specimen Description URINE, CLEAN CATCH  Final   Special Requests NONE  Final   Culture (A)  Final    >=100,000 COLONIES/mL AEROCOCCUS URINAE Standardized susceptibility testing for this organism is not available. Performed at [facility], [place] [address], [place], [place] [address]    Report Status 07/04/2017 FINAL  Final         Radiology Studies: No results found.      Scheduled Meds: . aspirin EC  81 mg Oral QHS  . dorzolamide-timolol  1 drop Right Eye BID  . enoxaparin (LOVENOX) injection  30 mg Subcutaneous QHS  . famotidine  20 mg Oral Daily  . guaiFENesin  5 mL Oral Q6H  . ipratropium-albuterol      . lactose free nutrition  237 mL Oral TID BM  . sertraline  25 mg Oral Daily  . sodium chloride HYPERTONIC  4 mL Nebulization BID  . sodium chloride  1 g Oral BID WC   Continuous Infusions: . sodium chloride    . cefTRIAXone (ROCEPHIN) IVPB 1 gram/50 mL D5W Stopped (07/05/17 2130)     LOS: 5 days      Lonnell Chaput Gerome Apley, MD [facility] Pager  [phone]  If 7PM-7AM, please contact night-coverage [email or web address] Password [email or web address] 07/06/2017, 9:55 AM

## 2017-07-07 DIAGNOSIS — K529 Noninfective gastroenteritis and colitis, unspecified: Secondary | ICD-10-CM

## 2017-07-07 LAB — BASIC METABOLIC PANEL
Anion gap: 6 (ref 5–15)
BUN: 12 mg/dL (ref 6–20)
CALCIUM: 8.3 mg/dL — AB (ref 8.9–10.3)
CHLORIDE: 99 mmol/L — AB (ref 101–111)
CO2: 26 mmol/L (ref 22–32)
Creatinine, Ser: 0.52 mg/dL (ref 0.44–1.00)
Glucose, Bld: 94 mg/dL (ref 65–99)
Potassium: 3.9 mmol/L (ref 3.5–5.1)
SODIUM: 131 mmol/L — AB (ref 135–145)

## 2017-07-07 LAB — CULTURE, BLOOD (ROUTINE X 2)
CULTURE: NO GROWTH
Special Requests: ADEQUATE

## 2017-07-07 MED ORDER — SODIUM CHLORIDE 3 % IN NEBU: 4.0000 mL | INHALATION_SOLUTION | Freq: Two times a day (BID) | RESPIRATORY_TRACT | 12 refills | Status: AC

## 2017-07-07 MED ORDER — GUAIFENESIN 100 MG/5ML PO SOLN: 5.0000 mL | Freq: Four times a day (QID) | ORAL | 0 refills | Status: AC

## 2017-07-07 MED ORDER — FAMOTIDINE 20 MG PO TABS: 20.0000 mg | ORAL_TABLET | Freq: Every day | ORAL | 0 refills | Status: AC

## 2017-07-07 MED ORDER — CEPHALEXIN 500 MG PO CAPS: 500.0000 mg | ORAL_CAPSULE | Freq: Two times a day (BID) | ORAL | 0 refills | Status: AC

## 2017-07-07 NOTE — Progress Notes (Signed)
Physical Therapy Treatment Patient Details Name: Kim Hardy MRN: [id number] DOB: 04/19/1931 Today's Date: 07/07/2017    History of Present Illness 83 yo female admitted with UTI, sepsis. Hx of laryngeal cancer, lung cancer, tracheostomy    PT Comments    Assisted pt OOB to amb to bathroom.  Mils unsteady gait.   Assisted in bathroom to aide in balance during self peri care and hand washing at sink.  Pt c/o increased fatigue.  Pt only agreed to amb to recliner.  Positioned in recliner to comfort.  Pt progressing slowly and plans to D/C to Rehab SNF.   Follow Up Recommendations  SNF     Equipment Recommendations  Rolling walker with 5" wheels    Recommendations for Other Services       Precautions / Restrictions Precautions Precautions: Fall Restrictions Weight Bearing Restrictions: No    Mobility  Bed Mobility Overal bed mobility: Needs Assistance Bed Mobility: Supine to Sit     Supine to sit: Supervision Sit to supine: Supervision   General bed mobility comments: increased time  Transfers Overall transfer level: Needs assistance Equipment used: Rolling walker (2 wheeled) Transfers: Sit to/from [employer] Transfers Sit to Stand: Min guard;Supervision Stand pivot transfers: Supervision;Min guard       General transfer comment: increased time with good use of hands to steady self   Also assisted to bathroom  Ambulation/Gait Ambulation/Gait assistance: Supervision;Min guard Ambulation Distance (Feet): 15 Feet (8 feet x 2 to and from bathroom) Assistive device: Rolling walker (2 wheeled) Gait Pattern/deviations: Step-to pattern;Step-through pattern Gait velocity: decreased   General Gait Details: Assist to stabilize throughout short distance. Gait is antalgic. Pt is unsteady.  Pt declined to amb in hallway(no specific reason)    amb to and from bathroom.     Stairs            Wheelchair Mobility    Modified Rankin (Stroke Patients Only)        Balance                                            Cognition Arousal/Alertness: Awake/alert Behavior During Therapy: WFL for tasks assessed/performed Overall Cognitive Status: Within Functional Limits for tasks assessed                                        Exercises      General Comments        Pertinent Vitals/Pain Pain Assessment: No/denies pain    Home Living                      Prior Function            PT Goals (current goals can now be found in the care plan section) Progress towards PT goals: Progressing toward goals    Frequency    Min 3X/week      PT Plan Current plan remains appropriate    Co-evaluation              AM-PAC PT "6 Clicks" Daily Activity  Outcome Measure  Difficulty turning over in bed (including adjusting bedclothes, sheets and blankets)?: Total Difficulty moving from lying on back to sitting on the side of the bed? : Total Difficulty sitting down on and  standing up from a chair with arms (e.g., wheelchair, bedside commode, etc,.)?: Total Help needed moving to and from a bed to chair (including a wheelchair)?: A Lot Help needed walking in hospital room?: A Lot Help needed climbing 3-5 steps with a railing? : Total 6 Click Score: 8    End of Session Equipment Utilized During Treatment: Gait belt Activity Tolerance: Patient tolerated treatment well Patient left: in chair;with call bell/phone within reach   PT Visit Diagnosis: Muscle weakness (generalized) (M62.81);Difficulty in walking, not elsewhere classified (R26.2)     Time: 3568-6168 PT Time Calculation (min) (ACUTE ONLY): 25 min  Charges:  $Gait Training: 8-22 mins $Therapeutic Activity: 8-22 mins                    G Codes:       Rica Koyanagi  PTA [facility]  Acute  Rehab Pager      [phone]

## 2017-07-07 NOTE — Progress Notes (Signed)
Pt is leaving with [facility] enroute to [facility], niece Santiago Glad has been notified

## 2017-07-07 NOTE — Progress Notes (Signed)
Called report to [facility].

## 2017-07-07 NOTE — Clinical Social Work Placement (Addendum)
4:26pm [facility] called for Transport.  Nurse given number for report.   CLINICAL SOCIAL WORK PLACEMENT  NOTE  Date:  07/07/2017  Patient Details  Name: Kim Hardy MRN: [id number] Date of Birth: January 15, 1931  Clinical Social Work is seeking post-discharge placement for this patient at the [place] level of care (*CSW will initial, date and re-position this form in  chart as items are completed):  Yes   Patient/family provided with [facility] Work Department's list of facilities offering this level of care within the geographic area requested by the patient (or if unable, by the patient's family).  Yes   Patient/family informed of their freedom to choose among providers that offer the needed level of care, that participate in Medicare, Medicaid or managed care program needed by the patient, have an available bed and are willing to accept the patient.  Yes   Patient/family informed of [facility]'s ownership interest in [facility] and [facility], as well as of the fact that they are under no obligation to receive care at these facilities.  PASRR submitted to EDS on 07/05/17     PASRR number received on 07/05/17     Existing PASRR number confirmed on       FL2 transmitted to all facilities in geographic area requested by pt/family on 07/05/17     FL2 transmitted to all facilities within larger geographic area on       Patient informed that his/her managed care company has contracts with or will negotiate with certain facilities, including the following:  [facility]     Yes   Patient/family informed of bed offers received.  Patient chooses bed at [facility]     Physician recommends and patient chooses bed at [facility]    Patient to be transferred to [facility] on 07/07/17.  Patient to be transferred to facility by Ambulance [facility])     Patient family notified on 07/07/17 of transfer.  Name of family  member notified:  Niece Santiago Glad     PHYSICIAN Please prepare priority discharge summary, including medications, Please prepare prescriptions, Please sign FL2     Additional Comment:    _______________________________________________ Lia Hopping, LCSW 07/07/2017, 2:11 PM

## 2017-07-07 NOTE — Progress Notes (Signed)
Chaplain completed and notarized Advance directive with pt.    Copy placed in chart.  Pt with original and copies.     [facility] / [place], MDiv
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
## 2017-07-07 NOTE — Progress Notes (Signed)
Occupational Therapy Treatment Patient Details Name: Kim Hardy MRN: [id number] DOB: [birth year] Today's Date: 07/07/2017    History of present illness 83 yo female admitted with UTI, sepsis. Hx of laryngeal cancer, lung cancer, tracheostomy   OT comments  Pt much improved this OT visit.  Follow Up Recommendations  SNF    Equipment Recommendations  None recommended by OT    Recommendations for Other Services      Precautions / Restrictions Precautions Precautions: Fall Restrictions Weight Bearing Restrictions: No       Mobility Bed Mobility Overal bed mobility: Needs Assistance Bed Mobility: Sit to Supine       Sit to supine: Supervision   General bed mobility comments: for safety, lines  Transfers Overall transfer level: Needs assistance Equipment used: Rolling walker (2 wheeled) Transfers: Sit to/from [facility] Sit to Stand: Min guard Stand pivot transfers: Min guard       General transfer comment: chair to bathroom to bed        ADL either performed or assessed with clinical judgement   ADL   Eating/Feeding: Set up;Sitting   Grooming: Standing;Wash/dry face;Wash/dry hands;Min guard   Upper Body Bathing: Set up;Sitting   Lower Body Bathing: Minimal assistance;Sit to/from stand;Cueing for safety   Upper Body Dressing : Set up;Sitting   Lower Body Dressing: Minimal assistance;Sit to/from stand;Cueing for safety   Toilet Transfer: Min guard;RW;Ambulation;Cueing for safety   Toileting- Clothing Manipulation and Hygiene: Supervision/safety;Sit to/from stand;Sitting/lateral lean         General ADL Comments: pt with improvement since last OT session!  Pt has not A at home so still would benefit from [facility] to increase I with ADL activity prior to DC home     Vision Patient Visual Report: No change from baseline            Cognition Arousal/Alertness: Awake/alert Behavior During Therapy: WFL for tasks  assessed/performed Overall Cognitive Status: Within Functional Limits for tasks assessed                                                     Pertinent Vitals/ Pain       Pain Assessment: No/denies pain         Frequency  Min 2X/week        Progress Toward Goals  OT Goals(current goals can now be found in the care plan section)  Progress towards OT goals: Progressing toward goals     Plan Discharge plan remains appropriate       AM-PAC PT "6 Clicks" Daily Activity     Outcome Measure   Help from another person eating meals?: None Help from another person taking care of personal grooming?: A Little Help from another person toileting, which includes using toliet, bedpan, or urinal?: A Little Help from another person bathing (including washing, rinsing, drying)?: A Little Help from another person to put on and taking off regular upper body clothing?: A Little Help from another person to put on and taking off regular lower body clothing?: A Little 6 Click Score: 19    End of Session Equipment Utilized During Treatment: Rolling walker  OT Visit Diagnosis: Unsteadiness on feet (R26.81);Muscle weakness (generalized) (M62.81);Pain   Activity Tolerance Patient tolerated treatment well   Patient Left in bed;with call bell/phone within reach   Nurse Communication  Mobility status        Time: 1135-1148 OT Time Calculation (min): 13 min  Charges: OT General Charges $OT Visit: 1 Procedure OT Treatments $Self Care/Home Management : 8-22 mins  [place], [place]   Betsy Pries 07/07/2017, 11:53 AM

## 2017-07-08 ENCOUNTER — Telehealth: Payer: Self-pay | Admitting: *Deleted

## 2017-07-08 NOTE — Telephone Encounter (Signed)
Pt was on TCM list admitted 07/01/17 to hospital with working diagnosis of sepsis due to urinary tract infection. During her hospitalization, she developed respiratory distress, large mucus plugging were removed from her tracheostomy through deep suctioning, she was placed on humidified oxygen per trach collar, hypertonic saline nebulizations and chest PT. Pt was D/C 07/07/17, and sent to SNF and summary did states she will need to f/u w/PCP 1 week after being d/c from SNF...Johny Chess

## 2017-07-13 NOTE — Progress Notes (Deleted)
Subjective:    Patient ID: Kim Hardy, female    DOB: [birth year], 83 y.o.   MRN: [id number]  HPI The patient is here for follow up.  She was admitted to the hospital 07/01/17 - 07/07/17. She was discharged to a SNF.  She went to the ED with right hip pain.  The pain started 4 weeks prior and was getting worse.  She had pain radiating down her right leg. She was having difficulty ambulating.  When she arrived her RR was 25, BP was 85/67, dry mucous membranes, but her exam was otherwise normal.  A UA was positive for infection.  Lumbar films showed arthritis, no fracture.  An EKG showed NSR.  She was admitted for sepsis.   Sepsis due to UTI:  She received IV and IV ceftriaxone.  She was discharged on 4 additional days of keflex.  H/o laryngeal cancer and lung cancer s/p tracheostomy:  She had complications of mucous plugging and impending respiratory failure.  She required deep suctioning and was placed on humidified oxygen per trach collar, had hypertonic saline neb treatments and Chest PT.  Her symptoms improved.    AKI with hypokalemia, hyponatremia, likely SIADH:  Her sodium dropped to 125, she was placed on sodium restriction, salt tablets and her sodium improved.  Her d/c sodium was 139.  Her kidney function improve with hydration.  Her potassium was repleted.    Severe calorie protein malnutrition:  Nutrition saw the patient.  She was started on Boost Plus TID.  She was continued on megrace.  Depression: She is taking her medication daily as prescribed. She denies any side effects from the medication. She feels her depression is well controlled and she is happy with her current dose of medication.     Medications and allergies reviewed with patient and updated if appropriate.  Patient Active Problem List   Diagnosis Date Noted  . Protein-calorie malnutrition, severe 07/04/2017  . Nausea 07/02/2017  . Osteoarthritis of right hip 07/02/2017  . UTI (urinary tract infection)  07/01/2017  . Elevated blood pressure reading 04/14/2017  . PVD (peripheral vascular disease) ([place]) 09/20/2016  . Bilateral hand numbness 09/11/2016  . Right leg pain 08/12/2016  . Hypertrophic toenail 08/12/2016  . Anorexia 07/10/2016  . Loss of weight 07/10/2016  . Depression 07/10/2016  . Hypertriglyceridemia 03/08/2016  . Colitis 03/08/2016  . laryngeal ca   . Lung cancer [facility])     Current Outpatient Prescriptions on File Prior to Visit  Medication Sig Dispense Refill  . aspirin EC 81 MG tablet Take 1 tablet (81 mg total) by mouth daily.    Marland Kitchen dimenhyDRINATE (DRAMAMINE) 50 MG tablet Take 50 mg by mouth every 8 (eight) hours as needed for nausea.    . dorzolamide-timolol (COSOPT) 22.3-6.8 MG/ML ophthalmic solution INSTILL 1 DROP INTO RIGHT EYE TWICE A DAY  2  . famotidine (PEPCID) 20 MG tablet Take 1 tablet (20 mg total) by mouth daily. 30 tablet 0  . guaiFENesin (ROBITUSSIN) 100 MG/5ML SOLN Take 5 mLs (100 mg total) by mouth every 6 (six) hours. 1200 mL 0  . lactose free nutrition (BOOST) LIQD Take 237 mLs by mouth 3 (three) times daily between meals.    . megestrol (MEGACE) 40 MG/ML suspension TAKE 10 ML BY MOUTH DAILY 240 mL 5  . OVER THE COUNTER MEDICATION Place 1 drop into both eyes 2 (two) times daily as needed (dry eyes).     . sertraline (ZOLOFT) 25 MG tablet TAKE  1 TABLET BY MOUTH DAILY 30 tablet 5  . sodium chloride HYPERTONIC 3 % nebulizer solution Take 4 mLs by nebulization 2 (two) times daily. 750 mL 12   No current facility-administered medications on file prior to visit.     Past Medical History:  Diagnosis Date  . Allergy    sulfa-hives, boniva=tremors,statins=n/v  . Diverticulosis 04/22/16   ct abd/pelvis  . History of radiation therapy 04/01/2011- 4/11/212   right lung  lower lobe  . Hypercholesterolemia   . laryngeal ca dx'd 1997   surg only  . Lung cancer ([place]) dx'd 01-2011   xrt comp 03/2011  . Peripheral vascular disease [facility])     Past Surgical  History:  Procedure Laterality Date  . LARYNGECTOMY  1997  . TRACHEOSTOMY    . VOICE PROSTHESIS      Social History   Social History  . Marital status: Divorced    Spouse name: N/A  . Number of children: N/A  . Years of education: N/A   Social History Main Topics  . Smoking status: Former Smoker    Packs/day: 1.00    Years: 20.00    Quit date: 03/30/1996  . Smokeless tobacco: Never Used     Comment: quit w/dx laryngeal cancer 1997  . Alcohol use No  . Drug use: No  . Sexual activity: Not on file   Other Topics Concern  . Not on file   Social History Narrative  . No narrative on file    No family history on file.  Review of Systems     Objective:  There were no vitals filed for this visit. Wt Readings from Last 3 Encounters:  07/01/17 90 lb (40.8 kg)  04/14/17 92 lb (41.7 kg)  01/13/17 85 lb (38.6 kg)   There is no height or weight on file to calculate BMI.   Physical Exam    Constitutional: Appears well-developed and well-nourished. No distress.  HENT:  Head: Normocephalic and atraumatic.  Neck: Neck supple. No tracheal deviation present. No thyromegaly present.  No cervical lymphadenopathy Cardiovascular: Normal rate, regular rhythm and normal heart sounds.   No murmur heard. No carotid bruit .  No edema Pulmonary/Chest: Effort normal and breath sounds normal. No respiratory distress. No has no wheezes. No rales.  Skin: Skin is warm and dry. Not diaphoretic.  Psychiatric: Normal mood and affect. Behavior is normal.      Assessment & Plan:    See Problem List for Assessment and Plan of chronic medical problems.

## 2017-07-14 ENCOUNTER — Ambulatory Visit: Payer: Medicare Other | Admitting: Internal Medicine

## 2017-07-28 ENCOUNTER — Telehealth: Payer: Self-pay | Admitting: Internal Medicine

## 2017-07-28 NOTE — Telephone Encounter (Signed)
Willis Modena kinderd at home  [phone] Office number [phone]   Needs verbals for home care uti and problems with her stomach She wants to follow her for about 4 weeks

## 2017-07-28 NOTE — Telephone Encounter (Signed)
Spoke with Pam to give verbal orders per MD

## 2017-08-05 ENCOUNTER — Telehealth: Payer: Self-pay | Admitting: Internal Medicine

## 2017-08-05 NOTE — Telephone Encounter (Signed)
PT needs VERBAL ORDERS: 1x1week 2x4weeks  [phone] - Charisse

## 2017-08-05 NOTE — Telephone Encounter (Addendum)
Spoke with Charisse to give verbal orders. Per MD

## 2017-08-05 NOTE — Progress Notes (Signed)
Subjective:    Patient ID: Jarvis Newcomer, female    DOB: 03-25-1931, 83 y.o.   MRN: [id number]  HPI The patient is here for follow up from the hospital and rehab.  Admitted to the hospital 07/01/17 - 07/07/17.  She was discharged to Rehab.    She went to the ED for right hip pain. It started 4 weeks prior and radiated down tot he right lower leg.  She was unable to ambulate.  Her BP was low, RR elevated and she had dry mucus membranes in the ED.  Her abdomen was tender in the lower abdomen. A UA was positive for an infection.  Her blood was unremarkable.  An EKG showed normal sinus rhythm.  Lumbar films with mild spondylolysis and advanced facet atrophy, no evidence of lumbar spine fracture. Diffuse bony under mineralization.  She was admitted for sepsis due to UTI.    Sepsis: she was treated with IVF and ceftriaxone.  She was discharged to rehab with 4 days of keflex.   History of laryngeal cancer and lung cancer s/p trach: she had complications of mucus plugging.  She developed respiratory distress and mucus plugging.  She required deep suctioning via her trach.  She was placed on humidified oxygen via her trach collar, hypertonic saline nebs and chest PT.    AKI with low K and low Na, suspected SIADH:  She improved with fluid restriction, salt tablets.  Her potassium was repleted.  Her kidney function improved.   Severe calorie protein malnutrition:  Nutrition saw her.  Supplements were recommended - Boost Plus TID.    Since being home her energy is low and slowly improving.  Her appetite is low and ? Improving.   She is drinking the boost three times a day.  She is eating some food, but not much.  She is trying to increase her intake.    Recent UTI, sepsis:  She has urinary frequency.  She denies change in urine odor, color or dysuria.  She denies hematuria or abdominal pain.   She has oxygen at the house but she does not think she needs it.  Her oxygen saturation here is 98%.  She has a  visiting nurse and they can monitor, but if normal she likely does not need it.  She denies cough, wheeze, and SOB.    Right thigh pain when walking. She has known hip arthritis an probable knee arthritis.  She has pain in her right thigh when she walks.  She denies pain at rest.  It is getting harder to walk and the pain is getting worse.    Medications and allergies reviewed with patient and updated if appropriate.  Patient Active Problem List   Diagnosis Date Noted  . Protein-calorie malnutrition, severe 07/04/2017  . Nausea 07/02/2017  . Osteoarthritis of right hip 07/02/2017  . UTI (urinary tract infection) 07/01/2017  . Elevated blood pressure reading 04/14/2017  . PVD (peripheral vascular disease) ([place]) 09/20/2016  . Bilateral hand numbness 09/11/2016  . Right leg pain 08/12/2016  . Hypertrophic toenail 08/12/2016  . Anorexia 07/10/2016  . Loss of weight 07/10/2016  . Depression 07/10/2016  . Hypertriglyceridemia 03/08/2016  . Colitis 03/08/2016  . laryngeal ca   . Lung cancer [facility])     Current Outpatient Prescriptions on File Prior to Visit  Medication Sig Dispense Refill  . aspirin EC 81 MG tablet Take 1 tablet (81 mg total) by mouth daily.    Marland Kitchen dimenhyDRINATE (DRAMAMINE) 50  MG tablet Take 50 mg by mouth every 8 (eight) hours as needed for nausea.    . dorzolamide-timolol (COSOPT) 22.3-6.8 MG/ML ophthalmic solution INSTILL 1 DROP INTO RIGHT EYE TWICE A DAY  2  . famotidine (PEPCID) 20 MG tablet Take 1 tablet (20 mg total) by mouth daily. 30 tablet 0  . guaiFENesin (ROBITUSSIN) 100 MG/5ML SOLN Take 5 mLs (100 mg total) by mouth every 6 (six) hours. 1200 mL 0  . lactose free nutrition (BOOST) LIQD Take 237 mLs by mouth 3 (three) times daily between meals.    . megestrol (MEGACE) 40 MG/ML suspension TAKE 10 ML BY MOUTH DAILY 240 mL 5  . OVER THE COUNTER MEDICATION Place 1 drop into both eyes 2 (two) times daily as needed (dry eyes).     . sertraline (ZOLOFT) 25 MG tablet  TAKE 1 TABLET BY MOUTH DAILY 30 tablet 5  . sodium chloride HYPERTONIC 3 % nebulizer solution Take 4 mLs by nebulization 2 (two) times daily. 750 mL 12   No current facility-administered medications on file prior to visit.     Past Medical History:  Diagnosis Date  . Allergy    sulfa-hives, boniva=tremors,statins=n/v  . Diverticulosis 04/22/16   ct abd/pelvis  . History of radiation therapy 04/01/2011- 4/11/212   right lung  lower lobe  . Hypercholesterolemia   . laryngeal ca dx'd 1997   surg only  . Lung cancer ([place]) dx'd 01-2011   xrt comp 03/2011  . Peripheral vascular disease [facility])     Past Surgical History:  Procedure Laterality Date  . LARYNGECTOMY  1997  . TRACHEOSTOMY    . VOICE PROSTHESIS      Social History   Social History  . Marital status: Divorced    Spouse name: N/A  . Number of children: N/A  . Years of education: N/A   Social History Main Topics  . Smoking status: Former Smoker    Packs/day: 1.00    Years: 20.00    Quit date: 03/30/1996  . Smokeless tobacco: Never Used     Comment: quit w/dx laryngeal cancer 1997  . Alcohol use No  . Drug use: No  . Sexual activity: Not on file   Other Topics Concern  . Not on file   Social History Narrative  . No narrative on file    No family history on file.  Review of Systems  Constitutional: Positive for appetite change (decreased) and fatigue. Negative for fever.  Respiratory: Negative for cough, shortness of breath and wheezing.   Cardiovascular: Negative for chest pain, palpitations and leg swelling.  Gastrointestinal: Negative for abdominal pain, constipation, diarrhea and nausea.  Genitourinary: Positive for frequency. Negative for dysuria and hematuria.  Neurological: Negative for light-headedness and headaches.       Objective:   Vitals:   08/06/17 0950  BP: 120/62  Pulse: 99  Resp: 18  Temp: 98.3 F (36.8 C)   Wt Readings from Last 3 Encounters:  08/06/17 88 lb (39.9 kg)  07/01/17  90 lb (40.8 kg)  04/14/17 92 lb (41.7 kg)   Body mass index is 17.19 kg/m.   Physical Exam    Constitutional: Appears well-developed and well-nourished. No distress.  HENT:  Head: Normocephalic and atraumatic.  Neck: Neck supple. No tracheal deviation present. No thyromegaly present.  No cervical lymphadenopathy Cardiovascular: Normal rate, regular rhythm and normal heart sounds.   No murmur heard. No carotid bruit .  Trace b/l LE edema Pulmonary/Chest: Effort normal and breath sounds  normal. No respiratory distress. No has no wheezes. No rales.  Abdomen: soft, non tender, non distended Skin: Skin is warm and dry. Not diaphoretic.  Psychiatric: Normal mood and affect. Behavior is normal.      Assessment & Plan:    See Problem List for Assessment and Plan of chronic medical problems.  yt

## 2017-08-06 ENCOUNTER — Encounter: Payer: Self-pay | Admitting: Internal Medicine

## 2017-08-06 ENCOUNTER — Ambulatory Visit (INDEPENDENT_AMBULATORY_CARE_PROVIDER_SITE_OTHER): Payer: Medicare Other | Admitting: Internal Medicine

## 2017-08-06 VITALS — BP 120/62 | HR 99 | Temp 98.3°F | Resp 18 | Wt 88.0 lb

## 2017-08-06 DIAGNOSIS — E43 Unspecified severe protein-calorie malnutrition: Secondary | ICD-10-CM | POA: Diagnosis not present

## 2017-08-06 DIAGNOSIS — N390 Urinary tract infection, site not specified: Secondary | ICD-10-CM | POA: Diagnosis not present

## 2017-08-06 DIAGNOSIS — M79604 Pain in right leg: Secondary | ICD-10-CM | POA: Diagnosis not present

## 2017-08-06 MED ORDER — ACETAMINOPHEN 160 MG/5ML PO LIQD: 500.0000 mg | ORAL | 5 refills | Status: AC | PRN

## 2017-08-06 NOTE — Assessment & Plan Note (Signed)
Possibly related to knee or hip OA Not taking medication - start tylenol liquid as needed Will refer to sports medicine for further evaluation

## 2017-08-06 NOTE — Patient Instructions (Addendum)
   Medications reviewed and updated.  Changes include starting liquid tylenol for pain.  Your prescription(s) have been submitted to your pharmacy. Please take as directed and contact our office if you believe you are having problem(s) with the medication(s).    Please followup in 3 months

## 2017-08-06 NOTE — Assessment & Plan Note (Signed)
She did have some weight loss with her recent illness Continue megace Boost TID Monitor weight Appetite will hopefully slowly increase

## 2017-08-06 NOTE — Assessment & Plan Note (Signed)
Recent UTI resulting in sepsis Resolved with IVF and antibiotics  No urinary symptoms now.  Discussed that possible symptoms of UTIs and if she has any concern for a uti we need to check her urine

## 2017-08-08 ENCOUNTER — Ambulatory Visit: Payer: Self-pay

## 2017-08-08 ENCOUNTER — Ambulatory Visit (INDEPENDENT_AMBULATORY_CARE_PROVIDER_SITE_OTHER): Payer: Medicare Other | Admitting: Family Medicine

## 2017-08-08 ENCOUNTER — Encounter: Payer: Self-pay | Admitting: Family Medicine

## 2017-08-08 VITALS — BP 130/60 | HR 83 | Temp 98.5°F | Ht 60.0 in | Wt 88.0 lb

## 2017-08-08 DIAGNOSIS — M25551 Pain in right hip: Secondary | ICD-10-CM

## 2017-08-08 DIAGNOSIS — M1611 Unilateral primary osteoarthritis, right hip: Secondary | ICD-10-CM

## 2017-08-08 NOTE — Patient Instructions (Addendum)
Thank you for coming in,   You can follow-up with me in 3 months to repeat this injection. You can take Tylenol for the pain.   Please feel free to call with any questions or concerns at any time, at [phone]. --Dr. Raeford Razor

## 2017-08-08 NOTE — Progress Notes (Signed)
Kim Hardy - 83 y.o. female MRN [id number]  Date of birth: March 26, 1931  SUBJECTIVE:  Including CC & ROS.  Chief Complaint  Patient presents with  . Leg Pain    right leg pain X6 weeks ago, 8/10 pain that comes and goes. worse when walking.    Kim Hardy is an 83 yo F that is presenting with right hip ain. The pain has been present for about two months. The pain started after she was admitted to the hospital for sepsis. Since being admitted she has been weak and having pain with ambulation. She has pain with rising from a seated position. The pain is severe. Pain is anterior of her right hip and into her groin.      Review of Systems  Musculoskeletal: Positive for arthralgias, gait problem and joint swelling.  Neurological: Positive for weakness. Negative for numbness.  Psychiatric/Behavioral: Negative for agitation.  otherwise negative   HISTORY: Past Medical, Surgical, Social, and Family History Reviewed & Updated per EMR.   Pertinent Historical Findings include:  Past Medical History:  Diagnosis Date  . Allergy    sulfa-hives, boniva=tremors,statins=n/v  . Diverticulosis 04/22/16   ct abd/pelvis  . History of radiation therapy 04/01/2011- 4/11/212   right lung  lower lobe  . Hypercholesterolemia   . laryngeal ca dx'd 1997   surg only  . Lung cancer ([place]) dx'd 01-2011   xrt comp 03/2011  . Peripheral vascular disease [facility])     Past Surgical History:  Procedure Laterality Date  . LARYNGECTOMY  1997  . TRACHEOSTOMY    . VOICE PROSTHESIS      Allergies  Allergen Reactions  . Anesthetics, Amide Nausea And Vomiting  . Boniva [Ibandronate Sodium] Other (See Comments)    tremors  . Ciprofloxacin     "I get very sick"  . Levofloxacin     Pt states she is unable to take this.   . Metronidazole     Pt states she is unable to tolerate.   . Statins Nausea And Vomiting  . Sulfa Antibiotics Hives    History reviewed. No pertinent family history.   Social History    Social History  . Marital status: Divorced    Spouse name: N/A  . Number of children: N/A  . Years of education: N/A   Occupational History  . Not on file.   Social History Main Topics  . Smoking status: Former Smoker    Packs/day: 1.00    Years: 20.00    Quit date: 03/30/1996  . Smokeless tobacco: Never Used     Comment: quit w/dx laryngeal cancer 1997  . Alcohol use No  . Drug use: No  . Sexual activity: Not on file   Other Topics Concern  . Not on file   Social History Narrative  . No narrative on file     PHYSICAL EXAM:  VS: BP 130/60 (BP Location: Right Arm, Patient Position: Sitting, Cuff Size: Small)   Pulse 83   Temp 98.5 F (36.9 C) (Oral)   Ht 5' (1.524 m)   Wt 88 lb (39.9 kg)   SpO2 98%   BMI 17.19 kg/m  Physical Exam Gen: NAD, alert, cooperative with exam,  ENT: normal lips, normal nasal mucosa,  Eye: normal EOM, normal conjunctiva and lids CV:  no edema, +2 pedal pulses   Resp: no accessory muscle use, non-labored,  Skin: no rashes, no areas of induration  Neuro: normal tone, normal sensation to touch Psych:  normal insight,  alert and oriented MSK:  Right Hip:  No TTP of the SI joint, GT or lumbar spine . Strength is symmetrical with hip flexion Strength is symmetrical but weak with knee flexion and extension  Normal IR and ER of the left hip Pain and limited ROM with IR of the right hip  Neurovascularly intact    Aspiration/Injection Procedure Note JOELYNN Hardy [birth year]  Procedure: Injection Indications: right hip pain   Procedure Details Consent: Risks of procedure as well as the alternatives and risks of each were explained to the (patient/caregiver).  Consent for procedure obtained. Time Out: Verified patient identification, verified procedure, site/side was marked, verified correct patient position, special equipment/implants available, medications/allergies/relevent history reviewed, required imaging and test results available.   Performed.  The area was cleaned with iodine and alcohol swabs.    The right hip joint was injected using 1 cc's of 40 mg kenalog and 4 cc's of 1% lidocaine with a 25 3" needle.  Ultrasound was used. Images were obtained in Transverse views showing the injection.    A sterile dressing was applied.  Patient did tolerate procedure well.  ASSESSMENT & PLAN:   Osteoarthritis of right hip Exam and imaging suggests her OA is the source of her pain. Would not tolerate surgery at this point. I have independently reviewed imaging of patient's CT scan.  - right hip injection with US guidance  - would have her follow up every 3 months or so for injections  - would try PT to build her strength if her hip pain is under control

## 2017-08-10 NOTE — Assessment & Plan Note (Signed)
Exam and imaging suggests her OA is the source of her pain. Would not tolerate surgery at this point. I have independently reviewed imaging of patient's CT scan.  - right hip injection with US guidance  - would have her follow up every 3 months or so for injections  - would try PT to build her strength if her hip pain is under control

## 2017-08-28 ENCOUNTER — Telehealth: Payer: Self-pay | Admitting: Internal Medicine

## 2017-08-28 MED ORDER — ONDANSETRON HCL 4 MG PO TABS: 4.0000 mg | ORAL_TABLET | Freq: Three times a day (TID) | ORAL | 0 refills | Status: AC | PRN

## 2017-08-28 MED ORDER — SERTRALINE HCL 25 MG PO TABS: 25.0000 mg | ORAL_TABLET | Freq: Every day | ORAL | 1 refills | Status: AC

## 2017-08-28 NOTE — Telephone Encounter (Signed)
Pt niece and POA called stating the patient needs a refill of her ondansetron (ZOFRAN) injection 4 mg   And sertraline (ZOLOFT) 25 MG tablet   Zofran has not been prescribed recently. States the patient is very nauseous and has not eaten in 4 days. Please advise  Please send to pharmacy on file

## 2017-08-28 NOTE — Telephone Encounter (Signed)
Appears she has had problems with weight gain and eating previously. Provided zofran but would need an appointment if her symptoms worse or are not improved. Zoloft refilled.   Rosemarie Ax, MD [facility] 08/28/2017, 2:02 PM

## 2017-08-28 NOTE — Telephone Encounter (Signed)
Pt niece informed of MD response and understood. Informed of scripts being sent in and will call back if she worsens.

## 2017-09-11 ENCOUNTER — Encounter: Payer: Self-pay | Admitting: Internal Medicine

## 2017-09-11 ENCOUNTER — Ambulatory Visit (INDEPENDENT_AMBULATORY_CARE_PROVIDER_SITE_OTHER): Payer: Medicare Other | Admitting: Internal Medicine

## 2017-09-11 VITALS — BP 126/78 | HR 114 | Temp 98.4°F | Ht 60.0 in | Wt 83.0 lb

## 2017-09-11 DIAGNOSIS — R110 Nausea: Secondary | ICD-10-CM | POA: Diagnosis not present

## 2017-09-11 DIAGNOSIS — R634 Abnormal weight loss: Secondary | ICD-10-CM

## 2017-09-11 DIAGNOSIS — C3490 Malignant neoplasm of unspecified part of unspecified bronchus or lung: Secondary | ICD-10-CM | POA: Diagnosis not present

## 2017-09-11 MED ORDER — PROMETHAZINE HCL 12.5 MG PO TABS: 12.5000 mg | ORAL_TABLET | Freq: Three times a day (TID) | ORAL | 2 refills | Status: AC | PRN

## 2017-09-11 MED ORDER — PANTOPRAZOLE SODIUM 40 MG PO TBEC: 40.0000 mg | DELAYED_RELEASE_TABLET | Freq: Every day | ORAL | 3 refills | Status: AC

## 2017-09-11 MED ORDER — MEGESTROL ACETATE 40 MG/ML PO SUSP
ORAL | 5 refills | Status: DC
Start: 2017-09-11 — End: 2017-09-25

## 2017-09-11 NOTE — Progress Notes (Signed)
Subjective:    Patient ID: Kim Hardy, female    DOB: 1931/06/07, 83 y.o.   MRN: [id number]  HPI  Here to f/u with daughter with c/o persistent weakness and nausea she has had for several yrs, maybe somewhat worse today. No falls.  Pt denies chest pain, increased sob or doe, wheezing, orthopnea, PND, increased LE swelling, palpitations, dizziness or syncope. Denies worsening reflux, abd pain, dysphagia, vomiting, bowel change or blood, but has low appetite with apparent slow wt loss over last several yrs. Most recently was rx zofran and also tried otc dramamine which seemed to help initially, then not in the last week.   Pt denies fever, night sweats, or other constitutional symptoms  Daughter recalls megace per Dr Quay Burow seemed to help but ran out of refills and did not request further refills.  Denies worsening depressive symptoms, suicidal ideation, or panic; Patient has a notable history of right lung cancer, no recent radiation or chemotherapy.  She also has a history of laryngectomy in the distant past  Has seen ENT recently in f/u.  Has had no prior GI evaluation or EGD.  She occasionally has left-sided abdominal pain, but this has been present for 1 year, whereas her other symptoms are new over the past 3 days. Most recent CT abd/pelvis during hospn with incidental findings of enlargement of right pulm mass, but apparently not addressed at the time. Wt Readings from Last 3 Encounters:  09/11/17 83 lb (37.6 kg)  08/08/17 88 lb (39.9 kg)  08/06/17 88 lb (39.9 kg)   BP Readings from Last 3 Encounters:  09/11/17 126/78  08/08/17 130/60  08/06/17 120/62   Past Medical History:  Diagnosis Date  . Allergy    sulfa-hives, boniva=tremors,statins=n/v  . Diverticulosis 04/22/16   ct abd/pelvis  . History of radiation therapy 04/01/2011- 4/11/212   right lung  lower lobe  . Hypercholesterolemia   . laryngeal ca dx'd 1997   surg only  . Lung cancer ([place]) dx'd 01-2011   xrt comp 03/2011  .  Peripheral vascular disease [facility])    Past Surgical History:  Procedure Laterality Date  . LARYNGECTOMY  1997  . TRACHEOSTOMY    . VOICE PROSTHESIS      reports that she quit smoking about 21 years ago. She has a 20.00 pack-year smoking history. She has never used smokeless tobacco. She reports that she does not drink alcohol or use drugs. family history is not on file. Allergies  Allergen Reactions  . Anesthetics, Amide Nausea And Vomiting  . Boniva [Ibandronate Sodium] Other (See Comments)    tremors  . Ciprofloxacin     "I get very sick"  . Levofloxacin     Pt states she is unable to take this.   . Metronidazole     Pt states she is unable to tolerate.   . Statins Nausea And Vomiting  . Sulfa Antibiotics Hives   Current Outpatient Prescriptions on File Prior to Visit  Medication Sig Dispense Refill  . acetaminophen (TYLENOL) 160 MG/5ML liquid Take 15.6 mLs (500 mg total) by mouth every 4 (four) hours as needed for fever. 473 mL 5  . aspirin EC 81 MG tablet Take 1 tablet (81 mg total) by mouth daily.    Marland Kitchen dimenhyDRINATE (DRAMAMINE) 50 MG tablet Take 50 mg by mouth every 8 (eight) hours as needed for nausea.    . dorzolamide-timolol (COSOPT) 22.3-6.8 MG/ML ophthalmic solution INSTILL 1 DROP INTO RIGHT EYE TWICE A DAY  2  .  guaiFENesin (ROBITUSSIN) 100 MG/5ML SOLN Take 5 mLs (100 mg total) by mouth every 6 (six) hours. 1200 mL 0  . lactose free nutrition (BOOST) LIQD Take 237 mLs by mouth 3 (three) times daily between meals.    . ondansetron (ZOFRAN) 4 MG tablet Take 1 tablet (4 mg total) by mouth every 8 (eight) hours as needed for nausea or vomiting. 20 tablet 0  . OVER THE COUNTER MEDICATION Place 1 drop into both eyes 2 (two) times daily as needed (dry eyes).     . sertraline (ZOLOFT) 25 MG tablet Take 1 tablet (25 mg total) by mouth daily. 30 tablet 1  . sodium chloride HYPERTONIC 3 % nebulizer solution Take 4 mLs by nebulization 2 (two) times daily. 750 mL 12  . famotidine  (PEPCID) 20 MG tablet Take 1 tablet (20 mg total) by mouth daily. 30 tablet 0   No current facility-administered medications on file prior to visit.    Review of Systems  Constitutional: Negative for other unusual diaphoresis or sweats HENT: Negative for ear discharge or swelling Eyes: Negative for other worsening visual disturbances Respiratory: Negative for stridor or other swelling  Gastrointestinal: Negative for worsening distension or other blood Genitourinary: Negative for retention or other urinary change Musculoskeletal: Negative for other MSK pain or swelling Skin: Negative for color change or other new lesions Neurological: Negative for worsening tremors and other numbness  Psychiatric/Behavioral: Negative for worsening agitation or other fatigue All other system neg per pt    Objective:   Physical Exam BP 126/78   Pulse (!) 114   Temp 98.4 F (36.9 C) (Oral)   Ht 5' (1.524 m)   Wt 83 lb (37.6 kg)   SpO2 95%   BMI 16.21 kg/m  VS noted, thin, frail, generally weak Constitutional: Pt appears in NAD HENT: Head: NCAT.  Right Ear: External ear normal.  Left Ear: External ear normal.  Eyes: . Pupils are equal, round, and reactive to light. Conjunctivae and EOM are normal Nose: without d/c or deformity Neck: Neck supple. Gross normal ROM Cardiovascular: Normal rate and regular rhythm.   Pulmonary/Chest: Effort normal and breath sounds decreased without rales or wheezing.  Abd:  Soft, NT, ND, + BS, no organomegaly Neurological: Pt is alert. At baseline orientation, motor grossly intact Skin: Skin is warm. No rashes, other new lesions, no LE edema Psychiatric: Pt behavior is normal without agitation  No other exam findings  07/01/2017 CT ABDOMEN AND PELVIS WITH CONTRAST - summary only -  IMPRESSION: 1. Moderate length segment of mid sigmoid colonic wall thickening, with surrounding soft tissue edema. Findings suggest colitis, similar findings are seen on prior exam.  This may be infectious, inflammatory or ischemic. Given moderate stool burden in the more proximal colon, recommend colonoscopy to exclude underlying colonic mass. 2. Advanced osteoarthritis of the right hip. 3. Marked vascular calcifications of the abdominal aorta and its branches, particularly the common iliac arteries. 4. Masslike opacity in the right lower lobe, similar in appearance to prior exams but mildly increased in size compared to 11/30/2015 chest CT (currently 3.3 x 2.8 cm previously 3.2 x 2.5 cm). This may reflect post treatment related change with radiation given prior PET-CT findings 01/28/2012, however cannot exclude slow growing malignancy given increased size. PET-CT characterization could be considered.      Assessment & Plan:

## 2017-09-11 NOTE — Assessment & Plan Note (Signed)
Mild chronic - for empiric phenergan prn, also Protonix 40 qd trial, refer GI - ? Need EGD

## 2017-09-11 NOTE — Assessment & Plan Note (Addendum)
Possibly slow growing, enlarging mass by recent CT which could represent a cause for the wt loss and possibly persistent nausea and weakness; d/w daughter - would pursue further evaluation with CT chest w CM, refer pulmonary for further consideration, has followed with oncology in past s/p XRT approx 2012

## 2017-09-11 NOTE — Assessment & Plan Note (Signed)
Etiology unclear, for BMP today, megace soln asd, refer GI - ? Need EGD

## 2017-09-11 NOTE — Patient Instructions (Addendum)
Please take all new medication as prescribed - the protonix for stomach acid  Please continue all other medications as before, and refills have been done if requested - the phenergan, and megace  Please have the pharmacy call with any other refills you may need.  You will be contacted regarding the referral for: CT scan for chest, Pulmonary, and Gastroenterology  Please keep your appointments with your specialists as you may have planned  Please go to the LAB in the Basement (turn left off the elevator) for the tests to be done today (just the kidney tests today)  You will be contacted by phone if any changes need to be made immediately.  Otherwise, you will receive a letter about your results with an explanation, but please check with MyChart first.  Please remember to sign up for MyChart if you have not done so, as this will be important to you in the future with finding out test results, communicating by private email, and scheduling acute appointments online when needed.  Please plan to follow up with Dr Quay Burow (your PCP) in 3-4 weeks

## 2017-09-16 ENCOUNTER — Emergency Department (HOSPITAL_COMMUNITY): Payer: Medicare Other

## 2017-09-16 ENCOUNTER — Inpatient Hospital Stay (HOSPITAL_COMMUNITY)
Admission: EM | Admit: 2017-09-16 | Discharge: 2017-09-25 | DRG: 919 | Disposition: A | Discharge status: Other Acute Inpatient Hospital | Payer: Medicare Other | Attending: Internal Medicine | Admitting: Internal Medicine

## 2017-09-16 ENCOUNTER — Institutional Professional Consult (permissible substitution): Payer: Medicare Other | Admitting: Internal Medicine

## 2017-09-16 ENCOUNTER — Encounter (HOSPITAL_COMMUNITY): Payer: Self-pay | Admitting: Emergency Medicine

## 2017-09-16 DIAGNOSIS — Z7982 Long term (current) use of aspirin: Secondary | ICD-10-CM

## 2017-09-16 DIAGNOSIS — Y722 Prosthetic and other implants, materials and accessory otorhinolaryngological devices associated with adverse incidents: Secondary | ICD-10-CM | POA: Diagnosis present

## 2017-09-16 DIAGNOSIS — T85698A Other mechanical complication of other specified internal prosthetic devices, implants and grafts, initial encounter: Principal | ICD-10-CM | POA: Diagnosis present

## 2017-09-16 DIAGNOSIS — E876 Hypokalemia: Secondary | ICD-10-CM | POA: Diagnosis not present

## 2017-09-16 DIAGNOSIS — Z681 Body mass index (BMI) 19 or less, adult: Secondary | ICD-10-CM

## 2017-09-16 DIAGNOSIS — J9601 Acute respiratory failure with hypoxia: Secondary | ICD-10-CM | POA: Diagnosis present

## 2017-09-16 DIAGNOSIS — Z79899 Other long term (current) drug therapy: Secondary | ICD-10-CM | POA: Diagnosis not present

## 2017-09-16 DIAGNOSIS — R339 Retention of urine, unspecified: Secondary | ICD-10-CM | POA: Diagnosis present

## 2017-09-16 DIAGNOSIS — Z515 Encounter for palliative care: Secondary | ICD-10-CM

## 2017-09-16 DIAGNOSIS — J181 Lobar pneumonia, unspecified organism: Secondary | ICD-10-CM | POA: Diagnosis not present

## 2017-09-16 DIAGNOSIS — I959 Hypotension, unspecified: Secondary | ICD-10-CM | POA: Diagnosis not present

## 2017-09-16 DIAGNOSIS — R491 Aphonia: Secondary | ICD-10-CM | POA: Diagnosis present

## 2017-09-16 DIAGNOSIS — Z87891 Personal history of nicotine dependence: Secondary | ICD-10-CM | POA: Diagnosis not present

## 2017-09-16 DIAGNOSIS — D72829 Elevated white blood cell count, unspecified: Secondary | ICD-10-CM | POA: Diagnosis not present

## 2017-09-16 DIAGNOSIS — Z8521 Personal history of malignant neoplasm of larynx: Secondary | ICD-10-CM

## 2017-09-16 DIAGNOSIS — F329 Major depressive disorder, single episode, unspecified: Secondary | ICD-10-CM | POA: Diagnosis present

## 2017-09-16 DIAGNOSIS — R627 Adult failure to thrive: Secondary | ICD-10-CM | POA: Diagnosis present

## 2017-09-16 DIAGNOSIS — R000 Tachycardia, unspecified: Secondary | ICD-10-CM | POA: Diagnosis not present

## 2017-09-16 DIAGNOSIS — J189 Pneumonia, unspecified organism: Secondary | ICD-10-CM | POA: Diagnosis not present

## 2017-09-16 DIAGNOSIS — R531 Weakness: Secondary | ICD-10-CM

## 2017-09-16 DIAGNOSIS — C3491 Malignant neoplasm of unspecified part of right bronchus or lung: Secondary | ICD-10-CM | POA: Diagnosis present

## 2017-09-16 DIAGNOSIS — E222 Syndrome of inappropriate secretion of antidiuretic hormone: Secondary | ICD-10-CM | POA: Diagnosis present

## 2017-09-16 DIAGNOSIS — R112 Nausea with vomiting, unspecified: Secondary | ICD-10-CM | POA: Diagnosis present

## 2017-09-16 DIAGNOSIS — R233 Spontaneous ecchymoses: Secondary | ICD-10-CM | POA: Diagnosis present

## 2017-09-16 DIAGNOSIS — Z7189 Other specified counseling: Secondary | ICD-10-CM | POA: Diagnosis not present

## 2017-09-16 DIAGNOSIS — R0602 Shortness of breath: Secondary | ICD-10-CM

## 2017-09-16 DIAGNOSIS — C801 Malignant (primary) neoplasm, unspecified: Secondary | ICD-10-CM | POA: Diagnosis present

## 2017-09-16 DIAGNOSIS — J690 Pneumonitis due to inhalation of food and vomit: Secondary | ICD-10-CM | POA: Diagnosis present

## 2017-09-16 DIAGNOSIS — E43 Unspecified severe protein-calorie malnutrition: Secondary | ICD-10-CM | POA: Diagnosis present

## 2017-09-16 DIAGNOSIS — I739 Peripheral vascular disease, unspecified: Secondary | ICD-10-CM | POA: Diagnosis present

## 2017-09-16 DIAGNOSIS — K5900 Constipation, unspecified: Secondary | ICD-10-CM | POA: Diagnosis not present

## 2017-09-16 DIAGNOSIS — Z66 Do not resuscitate: Secondary | ICD-10-CM | POA: Diagnosis present

## 2017-09-16 DIAGNOSIS — Z9002 Acquired absence of larynx: Secondary | ICD-10-CM

## 2017-09-16 DIAGNOSIS — Z923 Personal history of irradiation: Secondary | ICD-10-CM

## 2017-09-16 DIAGNOSIS — I361 Nonrheumatic tricuspid (valve) insufficiency: Secondary | ICD-10-CM | POA: Diagnosis not present

## 2017-09-16 DIAGNOSIS — E860 Dehydration: Secondary | ICD-10-CM | POA: Diagnosis present

## 2017-09-16 DIAGNOSIS — Z930 Tracheostomy status: Secondary | ICD-10-CM | POA: Diagnosis not present

## 2017-09-16 DIAGNOSIS — J9809 Other diseases of bronchus, not elsewhere classified: Secondary | ICD-10-CM | POA: Diagnosis present

## 2017-09-16 DIAGNOSIS — C3490 Malignant neoplasm of unspecified part of unspecified bronchus or lung: Secondary | ICD-10-CM | POA: Diagnosis present

## 2017-09-16 DIAGNOSIS — C3432 Malignant neoplasm of lower lobe, left bronchus or lung: Secondary | ICD-10-CM | POA: Diagnosis not present

## 2017-09-16 DIAGNOSIS — L8990 Pressure ulcer of unspecified site, unspecified stage: Secondary | ICD-10-CM | POA: Insufficient documentation

## 2017-09-16 DIAGNOSIS — E7800 Pure hypercholesterolemia, unspecified: Secondary | ICD-10-CM | POA: Diagnosis present

## 2017-09-16 DIAGNOSIS — T380X5A Adverse effect of glucocorticoids and synthetic analogues, initial encounter: Secondary | ICD-10-CM | POA: Diagnosis not present

## 2017-09-16 DIAGNOSIS — R338 Other retention of urine: Secondary | ICD-10-CM | POA: Diagnosis not present

## 2017-09-16 LAB — CBC
HEMATOCRIT: 38.7 % (ref 36.0–46.0)
Hemoglobin: 13.2 g/dL (ref 12.0–15.0)
MCH: 31.4 pg (ref 26.0–34.0)
MCHC: 34.1 g/dL (ref 30.0–36.0)
MCV: 91.9 fL (ref 78.0–100.0)
PLATELETS: 321 10*3/uL (ref 150–400)
RBC: 4.21 MIL/uL (ref 3.87–5.11)
RDW: 12.7 % (ref 11.5–15.5)
WBC: 10.5 10*3/uL (ref 4.0–10.5)

## 2017-09-16 LAB — BASIC METABOLIC PANEL
Anion gap: 13 (ref 5–15)
BUN: 12 mg/dL (ref 6–20)
CALCIUM: 8.7 mg/dL — AB (ref 8.9–10.3)
CO2: 20 mmol/L — ABNORMAL LOW (ref 22–32)
CREATININE: 0.62 mg/dL (ref 0.44–1.00)
Chloride: 94 mmol/L — ABNORMAL LOW (ref 101–111)
GFR calc Af Amer: >60 mL/min (ref >60)
GLUCOSE: 87 mg/dL (ref 65–99)
POTASSIUM: 4.8 mmol/L (ref 3.5–5.1)
SODIUM: 127 mmol/L — AB (ref 135–145)

## 2017-09-16 LAB — URINALYSIS, ROUTINE W REFLEX MICROSCOPIC
BILIRUBIN URINE: NEGATIVE
GLUCOSE, UA: NEGATIVE mg/dL
Ketones, ur: 5 mg/dL — AB
NITRITE: NEGATIVE
PH: 5 (ref 5.0–8.0)
Protein, ur: 30 mg/dL — AB
SPECIFIC GRAVITY, URINE: 1.019 (ref 1.005–1.030)

## 2017-09-16 LAB — TROPONIN I

## 2017-09-16 LAB — BRAIN NATRIURETIC PEPTIDE: B Natriuretic Peptide: 54.6 pg/mL (ref 0.0–100.0)

## 2017-09-16 LAB — I-STAT CG4 LACTIC ACID, ED: Lactic Acid, Venous: 1.38 mmol/L (ref 0.5–1.9)

## 2017-09-16 LAB — CBG MONITORING, ED: GLUCOSE-CAPILLARY: 82 mg/dL (ref 65–99)

## 2017-09-16 MED ORDER — SODIUM CHLORIDE 0.9 % IV SOLN
INTRAVENOUS | Status: AC
  Administered 2017-09-16: 23:00:00 via INTRAVENOUS

## 2017-09-16 MED ORDER — GUAIFENESIN 100 MG/5ML PO SOLN
5.0000 mL | Freq: Four times a day (QID) | ORAL | Status: DC
Start: 2017-09-16 — End: 2017-09-25
  Administered 2017-09-16 – 2017-09-24 (×13): 100 mg via ORAL
  Filled 2017-09-16 (×16): qty 10

## 2017-09-16 MED ORDER — ASPIRIN EC 81 MG PO TBEC
81.0000 mg | DELAYED_RELEASE_TABLET | Freq: Every day | ORAL | Status: DC
  Administered 2017-09-17 – 2017-09-23 (×6): 81 mg via ORAL
  Filled 2017-09-16 (×7): qty 1

## 2017-09-16 MED ORDER — DORZOLAMIDE HCL-TIMOLOL MAL 2-0.5 % OP SOLN
1.0000 [drp] | Freq: Two times a day (BID) | OPHTHALMIC | Status: DC
  Administered 2017-09-16 – 2017-09-25 (×18): 1 [drp] via OPHTHALMIC
  Filled 2017-09-16: qty 10

## 2017-09-16 MED ORDER — ENOXAPARIN SODIUM 30 MG/0.3ML ~~LOC~~ SOLN
30.0000 mg | SUBCUTANEOUS | Status: DC
  Administered 2017-09-17 – 2017-09-24 (×8): 30 mg via SUBCUTANEOUS
  Filled 2017-09-16 (×9): qty 0.3

## 2017-09-16 MED ORDER — ONDANSETRON HCL 4 MG/2ML IJ SOLN
4.0000 mg | Freq: Once | INTRAMUSCULAR | Status: AC
  Administered 2017-09-16: 4 mg via INTRAVENOUS
  Filled 2017-09-16: qty 2

## 2017-09-16 MED ORDER — CEFTRIAXONE SODIUM 1 G IJ SOLR
1.0000 g | Freq: Once | INTRAMUSCULAR | Status: AC
  Administered 2017-09-16: 1 g via INTRAVENOUS
  Filled 2017-09-16: qty 10

## 2017-09-16 MED ORDER — DEXTROSE 5 % IV SOLN
500.0000 mg | Freq: Once | INTRAVENOUS | Status: AC
  Administered 2017-09-16: 500 mg via INTRAVENOUS
  Filled 2017-09-16: qty 500

## 2017-09-16 MED ORDER — VANCOMYCIN HCL 500 MG IV SOLR
500.0000 mg | INTRAVENOUS | Status: DC
  Administered 2017-09-16 – 2017-09-17 (×2): 500 mg via INTRAVENOUS
  Filled 2017-09-16 (×2): qty 500

## 2017-09-16 MED ORDER — TRAZODONE HCL 50 MG PO TABS
50.0000 mg | ORAL_TABLET | Freq: Once | ORAL | Status: AC
  Administered 2017-09-16: 50 mg via ORAL
  Filled 2017-09-16: qty 1

## 2017-09-16 MED ORDER — SERTRALINE HCL 25 MG PO TABS
25.0000 mg | ORAL_TABLET | Freq: Every day | ORAL | Status: DC
  Administered 2017-09-17 – 2017-09-23 (×6): 25 mg via ORAL
  Filled 2017-09-16 (×7): qty 1

## 2017-09-16 MED ORDER — PIPERACILLIN-TAZOBACTAM 3.375 G IVPB
3.3750 g | Freq: Three times a day (TID) | INTRAVENOUS | Status: DC
  Administered 2017-09-17 – 2017-09-20 (×12): 3.375 g via INTRAVENOUS
  Filled 2017-09-16 (×13): qty 50

## 2017-09-16 MED ORDER — IOPAMIDOL (ISOVUE-370) INJECTION 76%
100.0000 mL | Freq: Once | INTRAVENOUS | Status: AC | PRN
  Administered 2017-09-16: 49 mL via INTRAVENOUS

## 2017-09-16 MED ORDER — SODIUM CHLORIDE 3 % IN NEBU
4.0000 mL | INHALATION_SOLUTION | Freq: Two times a day (BID) | RESPIRATORY_TRACT | Status: AC
  Administered 2017-09-16 – 2017-09-19 (×5): 4 mL via RESPIRATORY_TRACT
  Filled 2017-09-16 (×8): qty 4

## 2017-09-16 MED ORDER — SODIUM CHLORIDE 0.9 % IV BOLUS (SEPSIS)
1000.0000 mL | Freq: Once | INTRAVENOUS | Status: AC
  Administered 2017-09-16: 1000 mL via INTRAVENOUS

## 2017-09-16 MED ORDER — PANTOPRAZOLE SODIUM 40 MG PO TBEC: 40.0000 mg | DELAYED_RELEASE_TABLET | Freq: Every day | ORAL | Status: DC

## 2017-09-16 MED ORDER — ALBUTEROL SULFATE (2.5 MG/3ML) 0.083% IN NEBU: 2.5000 mg | INHALATION_SOLUTION | Freq: Four times a day (QID) | RESPIRATORY_TRACT | Status: DC | PRN

## 2017-09-16 MED ORDER — FAMOTIDINE 20 MG PO TABS
20.0000 mg | ORAL_TABLET | Freq: Every day | ORAL | Status: DC
  Administered 2017-09-17 – 2017-09-23 (×6): 20 mg via ORAL
  Filled 2017-09-16 (×7): qty 1

## 2017-09-16 MED ORDER — IOPAMIDOL (ISOVUE-370) INJECTION 76%
INTRAVENOUS | Status: AC
  Filled 2017-09-16: qty 100

## 2017-09-16 MED ORDER — PANTOPRAZOLE SODIUM 40 MG PO TBEC
40.0000 mg | DELAYED_RELEASE_TABLET | Freq: Every day | ORAL | Status: DC
  Administered 2017-09-17 – 2017-09-23 (×5): 40 mg via ORAL
  Filled 2017-09-16 (×7): qty 1

## 2017-09-16 MED ORDER — FAMOTIDINE IN NACL 20-0.9 MG/50ML-% IV SOLN
20.0000 mg | Freq: Once | INTRAVENOUS | Status: AC
  Administered 2017-09-16: 20 mg via INTRAVENOUS
  Filled 2017-09-16: qty 50

## 2017-09-16 NOTE — ED Notes (Addendum)
Respiratory call to help with pt suctioning .

## 2017-09-16 NOTE — H&P (Addendum)
History and Physical    BATOOL MAJID HFW:[id number] DOB: [birth year] DOA: 09/16/2017  PCP: [name], MD   Patient coming from: Home  Chief Complaint: nausea, weakness, SOB  HPI: Kim Hardy is a 83 y.o. female with medical history significant for lung cancer, laryngeal cancer, depression who presented today with complaints of weakness, nausea, poor by mouth intake of several months duration,  With slow gradual weight loss over the past 2 years. Patient also reports worsening shortness of breath over the past few months, and increased production of yellowish sputum and chills without fever over the past month.  Patient also reports suprapubic pain, denies dysuria.   ED Course: blood pressure initially low 72/43, temperature 98.1, tachycardia 122. WBC normal at 10.5, stable hemoglobin- 13.2, Sodium 127, bicarbonate mildly low at 20, creatinine at baseline 0.6. UA- small leukocytes, few bacteria. Two-view chest x-ray- grossly stable right lower lobe mass. CTA chest was done to rule out PE- negative for pulmonary embolism, back showed increased right lower lobe consolidation, left lower lobe nodularity, left patchy airspace disease- suspicious for infection. She was given 1 L bolus in the ED with improvement in blood pressure, started on ceftriaxone and azithromycin for CAP.   Review of Systems:  SKIN- No Rash, colour changes or itching. HEAD- No Headache . Mouth/throat- No Sorethroat,  or bleeding gums. CARDIAC- No Palpitations, or chest pain. NEUROLOGIC- No Numbness, syncope, seizures or burning. [facility]- Denies depression or anxiety.  Past Medical History:  Diagnosis Date  . Allergy    sulfa-hives, boniva=tremors,statins=n/v  . Diverticulosis 04/22/16   ct abd/pelvis  . History of radiation therapy 04/01/2011- 4/11/212   right lung  lower lobe  . Hypercholesterolemia   . laryngeal ca dx'd 1997   surg only  . Lung cancer ([place]) dx'd 01-2011   xrt comp 03/2011  . Peripheral vascular  disease [facility])     Past Surgical History:  Procedure Laterality Date  . LARYNGECTOMY  1997  . TRACHEOSTOMY    . VOICE PROSTHESIS      reports that she quit smoking about 21 years ago. She has a 20.00 pack-year smoking history. She has never used smokeless tobacco. She reports that she does not drink alcohol or use drugs.  Allergies  Allergen Reactions  . Anesthetics, Amide Nausea And Vomiting  . Boniva [Ibandronate Sodium] Other (See Comments)    tremors  . Ciprofloxacin     "I get very sick"  . Levofloxacin     Pt states she is unable to take this.   . Metronidazole     Pt states she is unable to tolerate.   . Statins Nausea And Vomiting  . Sulfa Antibiotics Hives   Family history not contributory.  Prior to Admission medications   Medication Sig Start Date End Date Taking? Authorizing Provider  acetaminophen (TYLENOL) 160 MG/5ML liquid Take 15.6 mLs (500 mg total) by mouth every 4 (four) hours as needed for fever. 08/06/17  Yes Burns, Claudina Lick, MD  aspirin EC 81 MG tablet Take 1 tablet (81 mg total) by mouth daily. 11/10/16  Yes Burns, Claudina Lick, MD  dimenhyDRINATE (DRAMAMINE) 50 MG tablet Take 50 mg by mouth every 8 (eight) hours as needed for nausea.   Yes [provider]  dorzolamide-timolol (COSOPT) 22.3-6.8 MG/ML ophthalmic solution INSTILL 1 DROP INTO RIGHT EYE TWICE A DAY 05/17/16  Yes [provider]  ibuprofen (ADVIL,MOTRIN) 200 MG tablet Take 400 mg by mouth every 6 (six) hours as needed for  mild pain or moderate pain.   Yes [provider]  megestrol (MEGACE) 40 MG/ML suspension TAKE 10 ML BY MOUTH DAILY 09/11/17  Yes Biagio Borg, MD  ondansetron (ZOFRAN) 4 MG tablet Take 1 tablet (4 mg total) by mouth every 8 (eight) hours as needed for nausea or vomiting. 08/28/17  Yes Rosemarie Ax, MD  OVER THE COUNTER MEDICATION Place 1 drop into both eyes 2 (two) times daily as needed (dry eyes).    Yes [provider]  promethazine (PHENERGAN)  12.5 MG tablet Take 1 tablet (12.5 mg total) by mouth every 8 (eight) hours as needed for nausea or vomiting. 09/11/17  Yes Biagio Borg, MD  sertraline (ZOLOFT) 25 MG tablet Take 1 tablet (25 mg total) by mouth daily. 08/28/17  Yes Rosemarie Ax, MD  sodium chloride HYPERTONIC 3 % nebulizer solution Take 4 mLs by nebulization 2 (two) times daily. 07/07/17  Yes Arrien, Jimmy Picket, MD  famotidine (PEPCID) 20 MG tablet Take 1 tablet (20 mg total) by mouth daily. 07/08/17 08/07/17  Arrien, Jimmy Picket, MD  guaiFENesin (ROBITUSSIN) 100 MG/5ML SOLN Take 5 mLs (100 mg total) by mouth every 6 (six) hours. Patient not taking: Reported on 09/16/2017 07/07/17   Arrien, Jimmy Picket, MD  pantoprazole (PROTONIX) 40 MG tablet Take 1 tablet (40 mg total) by mouth daily. Patient not taking: Reported on 09/16/2017 09/11/17   Biagio Borg, MD   Physical Exam: Vitals:   09/16/17 1338 09/16/17 1430 09/16/17 1556 09/16/17 1843  BP:  130/64 133/77 (!) 146/99  Pulse:  (!) 112 (!) 119 (!) 113  Resp:  20 17 17   Temp:      TempSrc:      SpO2:   94% 94%  Weight: 37.6 kg (83 lb)     Height: 5' (1.524 m)       Constitutional: NAD, calm, comfortable, trach status Vitals:   09/16/17 1338 09/16/17 1430 09/16/17 1556 09/16/17 1843  BP:  130/64 133/77 (!) 146/99  Pulse:  (!) 112 (!) 119 (!) 113  Resp:  20 17 17   Temp:      TempSrc:      SpO2:   94% 94%  Weight: 37.6 kg (83 lb)     Height: 5' (1.524 m)      Eyes: PERRL, lids and conjunctivae normal ENMT: Mucous membranes are mildly dry. Posterior pharynx clear of any exudate or lesions. Neck: normal, supple, no masses, no thyromegaly, trach stoma  Respiratory: coarse diffuse breath sounds equal bilaterally, no crackles. Normal respiratory effort. No accessory muscle use.  Cardiovascular: tachycardicrate and rhythm, no murmurs / rubs / gallops. No extremity edema. 2+ pedal pulses.  Abdomen: suprapubic tenderness, and fullness. No masses palpated. No  hepatosplenomegaly. Bowel sounds positive.  Musculoskeletal: no clubbing / cyanosis. No joint deformity upper and lower extremities. Good ROM, no contractures. Normal muscle tone.  Skin: no rashes, lesions, ulcers. No induration Neurologic: CN 2-12 grossly intact. Sensation intact, Strength 5/5 in all 4.  Psychiatric: Normal judgment and insight. Alert and oriented x 3. Normal mood.   Labs on Admission: I have personally reviewed following labs and imaging studies  CBC:  Recent Labs Lab 09/16/17 1442  WBC 10.5  HGB 13.2  HCT 38.7  MCV 91.9  PLT 833   Basic Metabolic Panel:  Recent Labs Lab 09/16/17 1442  NA 127*  K 4.8  CL 94*  CO2 20*  GLUCOSE 87  BUN 12  CREATININE 0.62  CALCIUM 8.7*  GFR: Estimated Creatinine Clearance: 31.1 mL/min (by C-G formula based on SCr of 0.62 mg/dL).  Coagulation Profile: No results for input(s): INR, PROTIME in the last 168 hours. Cardiac Enzymes:  Recent Labs Lab 09/16/17 1442  TROPONINI <0.03   CBG:  Recent Labs Lab 09/16/17 1444  GLUCAP 82   Urine analysis:    Component Value Date/Time   COLORURINE AMBER (A) 09/16/2017 1423   APPEARANCEUR HAZY (A) 09/16/2017 1423   LABSPEC 1.019 09/16/2017 1423   PHURINE 5.0 09/16/2017 1423   GLUCOSEU NEGATIVE 09/16/2017 1423   HGBUR SMALL (A) 09/16/2017 1423   BILIRUBINUR NEGATIVE 09/16/2017 1423   KETONESUR 5 (A) 09/16/2017 1423   PROTEINUR 30 (A) 09/16/2017 1423   UROBILINOGEN 0.2 11/27/2014 1018   NITRITE NEGATIVE 09/16/2017 1423   LEUKOCYTESUR SMALL (A) 09/16/2017 1423   Radiological Exams on Admission: Dg Chest 2 View  Result Date: 09/16/2017 CLINICAL DATA:  Shortness of breath, cough, lung cancer EXAM: CHEST  2 VIEW COMPARISON:  11/30/2015, 04/17/2015 FINDINGS: Normal heart size and vascularity. No effusion or pneumothorax. Ill-defined right lower lobe masslike opacity appears grossly stable by plain radiography. Vascular congestion noted with increased perihilar and  bibasilar interstitial opacities concerning for chronic interstitial lung disease versus edema. Normal heart size. Atherosclerosis of the aorta. Remote posterior right rib fractures. Atherosclerosis noted of the aorta. Degenerative changes of the spine. IMPRESSION: Grossly stable right lower lobe masslike opacity correlates with the previous right lung mass by CT. Increased hilar and bibasilar interstitial changes may represent chronic interstitial lung disease versus edema. Atherosclerosis Electronically Signed   By: Jerilynn Mages.  Shick M.D.   On: 09/16/2017 16:06   Ct Angio Chest Pe W Or Wo Contrast  Result Date: 09/16/2017 CLINICAL DATA:  Right-sided lung cancer with increased weakness for several days. Multiple falls. Tachycardic. Possible congestive heart failure. EXAM: CT ANGIOGRAPHY CHEST WITH CONTRAST TECHNIQUE: Multidetector CT imaging of the chest was performed using the standard protocol during bolus administration of intravenous contrast. Multiplanar CT image reconstructions and MIPs were obtained to evaluate the vascular anatomy. CONTRAST:  49 cc of Isovue [id number] COMPARISON:  09/16/2017 chest radiograph. Most recent CT of 11/30/2015. FINDINGS: Cardiovascular: The quality of this exam for evaluation of pulmonary embolism is moderate to good. Suboptimal opacification of pulmonary vascular branches in the right apex is favored to be due to bolus timing. These are difficult to follow but may be venous. Otherwise, no pulmonary embolism identified Advanced aortic and branch vessel atherosclerosis. Mild cardiomegaly with trace pericardial fluid. Multivessel coronary artery atherosclerosis. Mediastinum/Nodes: Tracheostomy. Mediastinal edema is chronic and likely radiation induced. No well-defined mediastinal adenopathy. No hilar adenopathy. Tiny hiatal hernia.  Mildly dilated upper esophagus is nonspecific. Lungs/Pleura: Similar small right pleural effusion. New trace left pleural fluid. Moderate centrilobular  emphysema. Left apical mild ground-glass opacity on image 12/series 11 new. There is also dependent mild airspace disease within both upper lobes. There is favored to represent subsegmental atelectasis and is unchanged on the left. Irregular left upper lobe 7 mm density on image 33/series 11 is new. Mild left lower lobe airspace disease, some of which has a nodular component. Example images 45 and 47/series 11. The airspace disease is new, with the nodularity present back on 07/01/2017. Slight increasing and lower lobe consolidation, and 2016 including image 45/series 11. Right lower lobe consolidation, small surrounding ground-glass opacity and architectural distortion are felt to be similar to July of this year abdominal study. Upper Abdomen: Old granulomatous disease in the liver. Normal imaged portions of  the spleen, pancreas, gallbladder, adrenal glands, kidneys. Musculoskeletal: Osteopenia. Nonacute posttraumatic or postsurgical deformities without posterolateral right ribs. Review of the MIP images confirms the above findings. IMPRESSION: 1.  No evidence of pulmonary embolism. 2. Right lower lobe consolidation is increased since 11/30/2015 but felt to be similar to 07/01/2017 abdominal study. This is most likely radiation induced. Given progression since the remote exam, locally recurrent disease cannot be entirely excluded but is felt unlikely. 3. Left lower lobe nodularity is similar back to July. Concurrent superimposed patchy airspace and ground-glass opacity is suspicious for infection. Left upper lobe ground-glass and soft tissue nodularity could also be infectious or neoplastic. Consider antibiotic therapy and CT follow-up at 6-12 weeks. 4. Coronary artery atherosclerosis. Aortic Atherosclerosis (ICD10-I70.0). 5. Similar small right pleural effusion with new left pleural fluid since 2016. 6.  Emphysema (ICD10-J43.9). Electronically Signed   By: Abigail Miyamoto M.D.   On: 09/16/2017 17:47    EKG:  Independently reviewed. Sinus tachycardia no significant change from prior  Assessment/Plan Principal Problem:   HCAP (healthcare-associated pneumonia) Active Problems:   laryngeal ca   Lung cancer (HCC)   Protein-calorie malnutrition, severe   HCAP- With SOB, increased sputum purulence. Trach status. WBC- 10.5. No fever. On room air. Hypotension, with Tachycardia. Lactic acid negative. CTA chest - No PE, but shows possible pneumonia- see full Ct details. Pt started on ceftriaxone and azithromycin in ED - Admit to inpatient telemetry - Considering hospital admission 06/2017 for UTI sepsis- Will start broad spectrum coverage Vanc, Zosyn - MRSA nasl aswab - Follow up cultures drawn in ED - IV fluids ns 100cc/hr X12 hrs  - consulting respiratory therapy for laryngeal deep suctioning - Pt eval  Lung mass- hx of lung cancer, appears to be increasing in size compared to CT scan 2016. Possible cause of patient's nausea and subsequent weight loss. - Consult Oncology a.m.  Hyponatremia- 127. Likely from Poor Po intake. Chronically low sodium ~130s. Dropped to 125 last admit, attributed to SIADH, with low serum osm, normal urine osm. - hydrate - BMP a.m.  Lower abd pain- reduced urine output in ED. Firm suprapubic area, with tenderness. Patient denies prior difficulty with urination. UA- smal leuks, few bacts.  - Bedside bladder scan- 700 L of urine. - In and out of cath X1 - Urine culture add onsevere particularly  DVT prophylaxis: Lovenox Code Status: Full  Family Communication: None at bedside  Disposition Plan:  To be determined Consults called: Consult Oncology Am Admission status: Inpt, Obs   Amery Vandenbos Arlyce Dice MD [facility] Pager [phone]  If 7PM-7AM, please contact night-coverage [email or web address] Password [facility]  09/16/2017, 8:58 PM

## 2017-09-16 NOTE — ED Provider Notes (Signed)
Medical screening examination/treatment/procedure(s) were conducted as a shared visit with non-physician practitioner(s) and myself.  I personally evaluated the patient during the encounter.   EKG Interpretation  Date/Time:  Tuesday September 16 2017 13:41:42 EDT Ventricular Rate:  124 PR Interval:    QRS Duration: 74 QT Interval:  323 QTC Calculation: 464 R Axis:   90 Text Interpretation:  Sinus tachycardia with irregular rate Consider left atrial enlargement Borderline right axis deviation Low voltage, precordial leads Probable anteroseptal infarct, old Confirmed by Lacretia Leigh [phone]) on 09/16/2017 3:14:102 PM     83 year old female with history of right-sided lung CA presents with increasing weakness as well as anorexia times several days. She's had multiple falls due to the above symptoms. Has had somewhat increased dyspnea without hemoptysis, fever, cough. Was seen by Dr. several days ago for similar symptoms and that workup was reviewed.. Today she is persistently tachycardic. X-ray shows possible edema. Will order CT the chest to rule out PE as well as labs for possible CHF   Lacretia Leigh, MD 09/16/17 [phone]

## 2017-09-16 NOTE — ED Notes (Signed)
Patient transported to X-ray 

## 2017-09-16 NOTE — ED Provider Notes (Signed)
[facility] DEPT Provider Note   CSN: [id number] Arrival date & time: 09/16/17  1257     History   Chief Complaint Chief Complaint  Patient presents with  . Weakness    HPI Kim Hardy is a 83 y.o. female with PMHx Diverticulosis, right lower lobe lung cancer, laryngeal cancer status post tracheostomy, PVD who presents today accompanied by niece with chief complaint of gradually worsening of chronic nausea and weakness for 3 weeks. Patient's niece states that she is constantly nauseous and unable to tolerate by mouth food or fluids as a result. She occasionally vomits, but typically her discomfort is associated with her severe nausea. She has been generally weak as a result of decreased oral intake, and has had increased falls as a result. She denies hitting her head or loss of consciousness. She is not on any blood thinners. She denies headache or vision changes. She was seen and evaluated by primary care physician 5 days ago, who refilled her Megace and started her on Phenergan and protonix without significant relief of her symptoms. He recommended follow-up with GI for evaluation or EGD, but they are unable to see her until November. At recent hospital admission 07/01/2017 her abdominal scan showed incidental finding of enlargement of her right pulmonary mass, but she has not undergone a PET scan. Has a history of laryngectomy in the distant past. Of note, patient does live alone and typically does not use a cane or walker to ambulate. She does endorse worsening DOE but denies orthopnea, leg swelling, or PND. She is a current nonsmoker. She is not on home oxygen at this time. Denies night sweats or unexplained weight loss, but states she has been slowly losing weight over the past 2 years.   The history is provided by the patient.    Past Medical History:  Diagnosis Date  . Allergy    sulfa-hives, boniva=tremors,statins=n/v  . Diverticulosis 04/22/16   ct abd/pelvis  . History of  radiation therapy 04/01/2011- 4/11/212   right lung  lower lobe  . Hypercholesterolemia   . laryngeal ca dx'd 1997   surg only  . Lung cancer ([place]) dx'd 01-2011   xrt comp 03/2011  . Peripheral vascular disease [facility])     Patient Active Problem List   Diagnosis Date Noted  . HCAP (healthcare-associated pneumonia) 09/16/2017  . Protein-calorie malnutrition, severe 07/04/2017  . Nausea 07/02/2017  . Osteoarthritis of right hip 07/02/2017  . UTI (urinary tract infection) 07/01/2017  . Elevated blood pressure reading 04/14/2017  . PVD (peripheral vascular disease) ([place]) 09/20/2016  . Bilateral hand numbness 09/11/2016  . Right leg pain 08/12/2016  . Hypertrophic toenail 08/12/2016  . Anorexia 07/10/2016  . Loss of weight 07/10/2016  . Depression 07/10/2016  . Hypertriglyceridemia 03/08/2016  . Colitis 03/08/2016  . laryngeal ca   . Lung cancer [facility])     Past Surgical History:  Procedure Laterality Date  . LARYNGECTOMY  1997  . TRACHEOSTOMY    . VOICE PROSTHESIS      OB History    No data available       Home Medications    Prior to Admission medications   Medication Sig Start Date End Date Taking? Authorizing Provider  acetaminophen (TYLENOL) 160 MG/5ML liquid Take 15.6 mLs (500 mg total) by mouth every 4 (four) hours as needed for fever. 08/06/17  Yes Burns, Claudina Lick, MD  aspirin EC 81 MG tablet Take 1 tablet (81 mg total) by mouth daily. 11/10/16  Yes Burns,  Claudina Lick, MD  dimenhyDRINATE (DRAMAMINE) 50 MG tablet Take 50 mg by mouth every 8 (eight) hours as needed for nausea.   Yes [provider]  dorzolamide-timolol (COSOPT) 22.3-6.8 MG/ML ophthalmic solution INSTILL 1 DROP INTO RIGHT EYE TWICE A DAY 05/17/16  Yes [provider]  ibuprofen (ADVIL,MOTRIN) 200 MG tablet Take 400 mg by mouth every 6 (six) hours as needed for mild pain or moderate pain.   Yes [provider]  megestrol (MEGACE) 40 MG/ML suspension TAKE 10 ML BY MOUTH DAILY 09/11/17  Yes  Biagio Borg, MD  ondansetron (ZOFRAN) 4 MG tablet Take 1 tablet (4 mg total) by mouth every 8 (eight) hours as needed for nausea or vomiting. 08/28/17  Yes Rosemarie Ax, MD  OVER THE COUNTER MEDICATION Place 1 drop into both eyes 2 (two) times daily as needed (dry eyes).    Yes [provider]  promethazine (PHENERGAN) 12.5 MG tablet Take 1 tablet (12.5 mg total) by mouth every 8 (eight) hours as needed for nausea or vomiting. 09/11/17  Yes Biagio Borg, MD  sertraline (ZOLOFT) 25 MG tablet Take 1 tablet (25 mg total) by mouth daily. 08/28/17  Yes Rosemarie Ax, MD  sodium chloride HYPERTONIC 3 % nebulizer solution Take 4 mLs by nebulization 2 (two) times daily. 07/07/17  Yes Arrien, Jimmy Picket, MD  famotidine (PEPCID) 20 MG tablet Take 1 tablet (20 mg total) by mouth daily. 07/08/17 08/07/17  Arrien, Jimmy Picket, MD  guaiFENesin (ROBITUSSIN) 100 MG/5ML SOLN Take 5 mLs (100 mg total) by mouth every 6 (six) hours. Patient not taking: Reported on 09/16/2017 07/07/17   Arrien, Jimmy Picket, MD  pantoprazole (PROTONIX) 40 MG tablet Take 1 tablet (40 mg total) by mouth daily. Patient not taking: Reported on 09/16/2017 09/11/17   Biagio Borg, MD    Family History No family history on file.  Social History Social History  Substance Use Topics  . Smoking status: Former Smoker    Packs/day: 1.00    Years: 20.00    Quit date: 03/30/1996  . Smokeless tobacco: Never Used     Comment: quit w/dx laryngeal cancer 1997  . Alcohol use No     Allergies   Anesthetics, amide; Boniva [ibandronate sodium]; Ciprofloxacin; Levofloxacin; Metronidazole; Statins; and Sulfa antibiotics   Review of Systems Review of Systems  Constitutional: Positive for fatigue. Negative for chills, diaphoresis, fever and unexpected weight change.  Respiratory: Positive for shortness of breath. Negative for cough.   Cardiovascular: Negative for chest pain, palpitations and leg swelling.    Gastrointestinal: Positive for nausea and vomiting. Negative for abdominal pain.  Genitourinary: Negative for dysuria and hematuria.  Neurological: Positive for weakness (weakness). Negative for syncope.  All other systems reviewed and are negative.    Physical Exam Updated Vital Signs BP (!) 95/47 (BP Location: Right Arm)   Pulse (!) 105   Temp 98.7 F (37.1 C) (Oral)   Resp 20   Ht 5' (1.524 m)   Wt 37.6 kg (83 lb)   SpO2 95%   BMI 16.21 kg/m   Physical Exam  Constitutional: She appears well-developed and well-nourished. No distress.  Thin and chronically ill appearing   HENT:  Head: Normocephalic and atraumatic.  Eyes: Pupils are equal, round, and reactive to light. Conjunctivae and EOM are normal. Right eye exhibits no discharge. Left eye exhibits no discharge. No scleral icterus.  Neck: Normal range of motion. Neck supple. No JVD present. No tracheal deviation present.  Tracheostomy without erythema or drainage. Speaks with soft voice as a result  Cardiovascular: Regular rhythm and normal heart sounds.   Tachycardic, 1+ radial and DP/PT pulses bilaterally, Homan sign absent bilaterally, no lower extremity edema  Pulmonary/Chest: Effort normal. She exhibits no tenderness.  Diffuse scattered ronchi, crackles and wheezes in the right lower lung bases  Abdominal: She exhibits no distension.  Musculoskeletal: She exhibits no edema.  Neurological: She is alert.  Skin: Skin is warm and dry. No erythema.  Psychiatric: She has a normal mood and affect. Her behavior is normal.  Nursing note and vitals reviewed.    ED Treatments / Results  Labs (all labs ordered are listed, but only abnormal results are displayed) Labs Reviewed  BASIC METABOLIC PANEL - Abnormal; Notable for the following:       Result Value   Sodium 127 (*)    Chloride 94 (*)    CO2 20 (*)    Calcium 8.7 (*)    All other components within normal limits  URINALYSIS, ROUTINE W REFLEX MICROSCOPIC -  Abnormal; Notable for the following:    Color, Urine AMBER (*)    APPearance HAZY (*)    Hgb urine dipstick SMALL (*)    Ketones, ur 5 (*)    Protein, ur 30 (*)    Leukocytes, UA SMALL (*)    Bacteria, UA FEW (*)    Squamous Epithelial / LPF 0-5 (*)    All other components within normal limits  BASIC METABOLIC PANEL - Abnormal; Notable for the following:    Sodium 131 (*)    Chloride 100 (*)    CO2 20 (*)    Calcium 8.1 (*)    All other components within normal limits  CULTURE, EXPECTORATED SPUTUM-ASSESSMENT  CULTURE, RESPIRATORY (NON-EXPECTORATED)  CULTURE, BLOOD (ROUTINE X 2)  CULTURE, BLOOD (ROUTINE X 2)  URINE CULTURE  MRSA PCR SCREENING  CBC  BRAIN NATRIURETIC PEPTIDE  TROPONIN I  CBC  CBG MONITORING, ED  I-STAT CG4 LACTIC ACID, ED    EKG  EKG Interpretation  Date/Time:  Tuesday September 16 2017 13:41:42 EDT Ventricular Rate:  124 PR Interval:    QRS Duration: 74 QT Interval:  323 QTC Calculation: 464 R Axis:   90 Text Interpretation:  Sinus tachycardia with irregular rate Consider left atrial enlargement Borderline right axis deviation Low voltage, precordial leads Probable anteroseptal infarct, old Confirmed by Lacretia Leigh ([id number]) on 09/16/2017 3:14:53 PM       Radiology Dg Chest 2 View  Result Date: 09/16/2017 CLINICAL DATA:  Shortness of breath, cough, lung cancer EXAM: CHEST  2 VIEW COMPARISON:  11/30/2015, 04/17/2015 FINDINGS: Normal heart size and vascularity. No effusion or pneumothorax. Ill-defined right lower lobe masslike opacity appears grossly stable by plain radiography. Vascular congestion noted with increased perihilar and bibasilar interstitial opacities concerning for chronic interstitial lung disease versus edema. Normal heart size. Atherosclerosis of the aorta. Remote posterior right rib fractures. Atherosclerosis noted of the aorta. Degenerative changes of the spine. IMPRESSION: Grossly stable right lower lobe masslike opacity correlates  with the previous right lung mass by CT. Increased hilar and bibasilar interstitial changes may represent chronic interstitial lung disease versus edema. Atherosclerosis Electronically Signed   By: Jerilynn Mages.  Shick M.D.   On: 09/16/2017 16:06   Ct Angio Chest Pe W Or Wo Contrast  Result Date: 09/16/2017 CLINICAL DATA:  Right-sided lung cancer with increased weakness for several days. Multiple falls. Tachycardic. Possible congestive heart failure. EXAM: CT ANGIOGRAPHY CHEST WITH CONTRAST  TECHNIQUE: Multidetector CT imaging of the chest was performed using the standard protocol during bolus administration of intravenous contrast. Multiplanar CT image reconstructions and MIPs were obtained to evaluate the vascular anatomy. CONTRAST:  49 cc of Isovue [id number] COMPARISON:  09/16/2017 chest radiograph. Most recent CT of 11/30/2015. FINDINGS: Cardiovascular: The quality of this exam for evaluation of pulmonary embolism is moderate to good. Suboptimal opacification of pulmonary vascular branches in the right apex is favored to be due to bolus timing. These are difficult to follow but may be venous. Otherwise, no pulmonary embolism identified Advanced aortic and branch vessel atherosclerosis. Mild cardiomegaly with trace pericardial fluid. Multivessel coronary artery atherosclerosis. Mediastinum/Nodes: Tracheostomy. Mediastinal edema is chronic and likely radiation induced. No well-defined mediastinal adenopathy. No hilar adenopathy. Tiny hiatal hernia.  Mildly dilated upper esophagus is nonspecific. Lungs/Pleura: Similar small right pleural effusion. New trace left pleural fluid. Moderate centrilobular emphysema. Left apical mild ground-glass opacity on image 12/series 11 new. There is also dependent mild airspace disease within both upper lobes. There is favored to represent subsegmental atelectasis and is unchanged on the left. Irregular left upper lobe 7 mm density on image 33/series 11 is new. Mild left lower lobe airspace  disease, some of which has a nodular component. Example images 45 and 47/series 11. The airspace disease is new, with the nodularity present back on 07/01/2017. Slight increasing and lower lobe consolidation, and 2016 including image 45/series 11. Right lower lobe consolidation, small surrounding ground-glass opacity and architectural distortion are felt to be similar to July of this year abdominal study. Upper Abdomen: Old granulomatous disease in the liver. Normal imaged portions of the spleen, pancreas, gallbladder, adrenal glands, kidneys. Musculoskeletal: Osteopenia. Nonacute posttraumatic or postsurgical deformities without posterolateral right ribs. Review of the MIP images confirms the above findings. IMPRESSION: 1.  No evidence of pulmonary embolism. 2. Right lower lobe consolidation is increased since 11/30/2015 but felt to be similar to 07/01/2017 abdominal study. This is most likely radiation induced. Given progression since the remote exam, locally recurrent disease cannot be entirely excluded but is felt unlikely. 3. Left lower lobe nodularity is similar back to July. Concurrent superimposed patchy airspace and ground-glass opacity is suspicious for infection. Left upper lobe ground-glass and soft tissue nodularity could also be infectious or neoplastic. Consider antibiotic therapy and CT follow-up at 6-12 weeks. 4. Coronary artery atherosclerosis. Aortic Atherosclerosis (ICD10-I70.0). 5. Similar small right pleural effusion with new left pleural fluid since 2016. 6.  Emphysema (ICD10-J43.9). Electronically Signed   By: Abigail Miyamoto M.D.   On: 09/16/2017 17:47    Procedures Procedures (including critical care time)  Medications Ordered in ED Medications  famotidine (PEPCID) tablet 20 mg (20 mg Oral Given 09/17/17 0947)  guaiFENesin (ROBITUSSIN) 100 MG/5ML solution 100 mg (100 mg Oral Given 09/17/17 0947)  sertraline (ZOLOFT) tablet 25 mg (25 mg Oral Given 09/17/17 0947)  sodium chloride  HYPERTONIC 3 % nebulizer solution 4 mL (4 mLs Nebulization Given 09/17/17 0940)  aspirin EC tablet 81 mg (81 mg Oral Given 09/17/17 0947)  dorzolamide-timolol (COSOPT) 22.3-6.8 MG/ML ophthalmic solution 1 drop (1 drop Right Eye Given 09/17/17 0947)  enoxaparin (LOVENOX) injection 30 mg (30 mg Subcutaneous Given 09/17/17 0821)  0.9 %  sodium chloride infusion ( Intravenous Restarted 09/17/17 0630)  vancomycin (VANCOCIN) 500 mg in sodium chloride 0.9 % 100 mL IVPB (0 mg Intravenous Stopped 09/17/17 0000)  piperacillin-tazobactam (ZOSYN) IVPB 3.375 g (3.375 g Intravenous New Bag/Given 09/17/17 0948)  pantoprazole (PROTONIX) EC tablet 40 mg (40  mg Oral Given 09/17/17 0947)  albuterol (PROVENTIL) (2.5 MG/3ML) 0.083% nebulizer solution 2.5 mg (not administered)  lip balm (CARMEX) ointment (not administered)  promethazine (PHENERGAN) tablet 12.5 mg (not administered)  polyethylene glycol (MIRALAX / GLYCOLAX) packet 17 g (17 g Oral Given 09/17/17 0947)  senna (SENOKOT) tablet 8.6 mg (8.6 mg Oral Given 09/17/17 0947)  ondansetron (ZOFRAN) injection 4 mg (4 mg Intravenous Given 09/17/17 0947)  lactose free nutrition (BOOST PLUS) liquid 237 mL (not administered)  sodium chloride 0.9 % bolus 1,000 mL (0 mLs Intravenous Stopped 09/16/17 1533)  iopamidol (ISOVUE-370) 76 % injection 100 mL ( Intravenous Canceled Entry 09/16/17 1710)  cefTRIAXone (ROCEPHIN) 1 g in dextrose 5 % 50 mL IVPB (0 g Intravenous Stopped 09/16/17 2019)  azithromycin (ZITHROMAX) 500 mg in dextrose 5 % 250 mL IVPB (0 mg Intravenous Stopped 09/16/17 2237)  ondansetron (ZOFRAN) injection 4 mg (4 mg Intravenous Given 09/16/17 2019)  famotidine (PEPCID) IVPB 20 mg premix (0 mg Intravenous Stopped 09/16/17 2119)  traZODone (DESYREL) tablet 50 mg (50 mg Oral Given 09/16/17 2326)     Initial Impression / Assessment and Plan / ED Course  I have reviewed the triage vital signs and the nursing notes.  Pertinent labs & imaging results that were available  during my care of the patient were reviewed by me and considered in my medical decision making (see chart for details).     Patient with complicated medical history presents with generalized fatigue as well as severely decreased oral intake secondary to nausea. Afebrile, tachycardic and hypotensive initially while in the ED with some improvement in her blood pressure after administration of fluids.chest x-ray shows grossly stable right lower lobe masslike opacity which correlates with the previous right lung mass by CT. Increased hilar and bibasilar interstitial changes may represent chronic interstitial lung disease versus edema. However, BNP is normal making CHF less likely. CT scan shows no evidence of PE, however does show a community-acquired pneumonia. Spoke with Dr. Denton Brick with hospitalist service who agrees to assume care of patient and bring her into the hospital for further management. Patient seen and evaluated by Dr. Zenia Resides who agrees with assessment and plan at this time. Final Clinical Impressions(s) / ED Diagnoses   Final diagnoses:  Community acquired pneumonia of left lower lobe of lung [facility])  Generalized weakness    New Prescriptions Current Discharge Medication List       Renita Papa, PA-C 09/17/17 1309

## 2017-09-16 NOTE — Progress Notes (Signed)
Pharmacy Antibiotic Note  TERSA Hardy is a 83 y.o. female admitted on 09/16/2017 with pneumonia.  PMH significant for lung cancer, no recent chemotherapy.  Pharmacy consulted for vancomycin and zosyn dosing.  Note patient's small height and weight.  Plan: Vancomycin 500 mg IV q24h. Zosyn 3.375g IV q8h (4 hour infusion time).  Daily SCr.   Height: 5' (152.4 cm) Weight: 83 lb (37.6 kg) IBW/kg (Calculated) : 45.5  Temp (24hrs), Avg:98.1 F (36.7 C), Min:98.1 F (36.7 C), Max:98.1 F (36.7 C)   Recent Labs Lab 09/16/17 1442 09/16/17 1613  WBC 10.5  --   CREATININE 0.62  --   LATICACIDVEN  --  1.38    Estimated Creatinine Clearance: 31.1 mL/min (by C-G formula based on SCr of 0.62 mg/dL).    Allergies  Allergen Reactions  . Anesthetics, Amide Nausea And Vomiting  . Boniva [Ibandronate Sodium] Other (See Comments)    tremors  . Ciprofloxacin     "I get very sick"  . Levofloxacin     Pt states she is unable to take this.   . Metronidazole     Pt states she is unable to tolerate.   . Statins Nausea And Vomiting  . Sulfa Antibiotics Hives    Antimicrobials this admission: 9/18 CTX x 1 9/18 Vancomycin >> 9/18 Zosyn >>  Dose adjustments this admission:  Microbiology results: 9/18 BCx:   Thank you for allowing pharmacy to be a part of this patient's care.  Kim Hardy 09/16/2017 8:13 PM

## 2017-09-16 NOTE — ED Triage Notes (Signed)
Patient presents with weakness, nausea, low PO intake, and frequent falls progressively over the past couple months. Niece escorted patient and is providing history, states that pt was prescribed 3 different medications but none are helping with nausea. Denies any pain.

## 2017-09-17 ENCOUNTER — Inpatient Hospital Stay (HOSPITAL_COMMUNITY): Payer: Medicare Other

## 2017-09-17 DIAGNOSIS — I361 Nonrheumatic tricuspid (valve) insufficiency: Secondary | ICD-10-CM

## 2017-09-17 LAB — BASIC METABOLIC PANEL
ANION GAP: 11 (ref 5–15)
BUN: 9 mg/dL (ref 6–20)
CO2: 20 mmol/L — ABNORMAL LOW (ref 22–32)
CREATININE: 0.57 mg/dL (ref 0.44–1.00)
Calcium: 8.1 mg/dL — ABNORMAL LOW (ref 8.9–10.3)
Chloride: 100 mmol/L — ABNORMAL LOW (ref 101–111)
GFR calc Af Amer: >60 mL/min (ref >60)
Glucose, Bld: 76 mg/dL (ref 65–99)
POTASSIUM: 3.6 mmol/L (ref 3.5–5.1)
Sodium: 131 mmol/L — ABNORMAL LOW (ref 135–145)

## 2017-09-17 LAB — CBC
HEMATOCRIT: 36.5 % (ref 36.0–46.0)
Hemoglobin: 12.2 g/dL (ref 12.0–15.0)
MCH: 31.1 pg (ref 26.0–34.0)
MCHC: 33.4 g/dL (ref 30.0–36.0)
MCV: 93.1 fL (ref 78.0–100.0)
Platelets: 307 10*3/uL (ref 150–400)
RBC: 3.92 MIL/uL (ref 3.87–5.11)
RDW: 12.8 % (ref 11.5–15.5)
WBC: 9.2 10*3/uL (ref 4.0–10.5)

## 2017-09-17 LAB — EXPECTORATED SPUTUM ASSESSMENT W REFEX TO RESP CULTURE

## 2017-09-17 LAB — ECHOCARDIOGRAM COMPLETE
HEIGHTINCHES: 60 in
WEIGHTICAEL: 1328 [oz_av]

## 2017-09-17 LAB — MRSA PCR SCREENING: MRSA by PCR: NEGATIVE

## 2017-09-17 LAB — EXPECTORATED SPUTUM ASSESSMENT W GRAM STAIN, RFLX TO RESP C

## 2017-09-17 MED ORDER — LIP MEDEX EX OINT: TOPICAL_OINTMENT | CUTANEOUS | Status: DC | PRN

## 2017-09-17 MED ORDER — SENNA 8.6 MG PO TABS
1.0000 | ORAL_TABLET | Freq: Two times a day (BID) | ORAL | Status: DC
  Administered 2017-09-17 – 2017-09-22 (×7): 8.6 mg via ORAL
  Filled 2017-09-17 (×12): qty 1

## 2017-09-17 MED ORDER — PROMETHAZINE HCL 25 MG PO TABS
12.5000 mg | ORAL_TABLET | Freq: Three times a day (TID) | ORAL | Status: DC | PRN
  Administered 2017-09-17: 12.5 mg via ORAL
  Filled 2017-09-17: qty 1

## 2017-09-17 MED ORDER — BOOST PLUS PO LIQD
237.0000 mL | Freq: Three times a day (TID) | ORAL | Status: DC
  Administered 2017-09-17 – 2017-09-21 (×9): 237 mL via ORAL
  Filled 2017-09-17 (×26): qty 237

## 2017-09-17 MED ORDER — POLYETHYLENE GLYCOL 3350 17 G PO PACK
17.0000 g | PACK | Freq: Every day | ORAL | Status: DC
  Administered 2017-09-17 – 2017-09-22 (×4): 17 g via ORAL
  Filled 2017-09-17 (×5): qty 1

## 2017-09-17 MED ORDER — POTASSIUM CHLORIDE IN NACL 20-0.9 MEQ/L-% IV SOLN
INTRAVENOUS | Status: DC
  Administered 2017-09-17 – 2017-09-18 (×2): via INTRAVENOUS
  Filled 2017-09-17 (×3): qty 1000

## 2017-09-17 MED ORDER — ONDANSETRON HCL 4 MG/2ML IJ SOLN
4.0000 mg | Freq: Four times a day (QID) | INTRAMUSCULAR | Status: DC | PRN
  Administered 2017-09-17 – 2017-09-23 (×4): 4 mg via INTRAVENOUS
  Filled 2017-09-17 (×4): qty 2

## 2017-09-17 NOTE — Progress Notes (Signed)
  Echocardiogram 2D Echocardiogram has been performed.  Jennette Dubin 09/17/2017, 3:33 PM

## 2017-09-17 NOTE — Progress Notes (Signed)
Initial Nutrition Assessment  DOCUMENTATION CODES:   Underweight, Severe malnutrition in context of chronic illness  INTERVENTION:   Boost Plus chocolate TID- Each supplement provides 360kcal and 14g protein.    NUTRITION DIAGNOSIS:   Malnutrition (Severe) related to chronic illness (lung/laryngeal cancer) as evidenced by energy intake < or equal to 50% for > or equal to 1 month, severe depletion of body fat, severe depletion of muscle mass, 8% wt loss in 2 months.  GOAL:   Patient will meet greater than or equal to 90% of their needs  MONITOR:   PO intake, Supplement acceptance, Labs, Weight trends  REASON FOR ASSESSMENT:   Malnutrition Screening Tool   ASSESSMENT:   Pt with PMH of lung/layngeal cancer s/p tracheostomy and depression. Presents to ED with complaints of weakness, shortness of breath, nausea, and poor PO intake for months duration. Admitted for  HCAP and lung mass (found 2016) that looks to be increasing in size this hospital stay.   Spoke with pt at bedside. Pt has difficulty speaking. Reports having loss in appetite for > 2 months related to nausea. States she hasn't consumed whole foods in the last two weeks, but supplements with Boost 3-4 times per day. Suspect pt has not consumed >50% of his estimated energy requirement for > 1 month as evidence by her 8% wt loss in 2 months. This percentage in this time frame is significant. Nutrition-Focused physical exam completed. Findings are severe fat depletion, severe muscle depletion, and no edema. Pt does not report any swallowing issues. Will continue with Boost Plus this hospital stay and encourage PO intake.   Medications reviewed and include: IV abx Labs reviewed: Na 131 (L), CO2 20 (L)  Diet Order:  Diet regular Room service appropriate? Yes; Fluid consistency: Thin  Skin:  Reviewed, no issues  Last BM:  PTA  Height:   Ht Readings from Last 1 Encounters:  09/16/17 5' (1.524 m)    Weight:   Wt  Readings from Last 1 Encounters:  09/16/17 83 lb (37.6 kg)    Ideal Body Weight:  45.5 kg  BMI:  Body mass index is 16.21 kg/m.  Estimated Nutritional Needs:   Kcal:  1400-1600 (31-35 kcal/kg IBW)  Protein:  80-90 grams (1.8-2 g/kg IBW)  Fluid:  >1.4 L/day  EDUCATION NEEDS:   No education needs identified at this time  [place], LDN Clinical Nutrition Pager # - [phone]

## 2017-09-17 NOTE — Progress Notes (Signed)
Pt has been able to void 3 times since arriving to floor and does not want an in and out cath.

## 2017-09-17 NOTE — Care Management Note (Signed)
Case Management Note  Patient Details  Name: CAMREN HENTHORN MRN: [id number] Date of Birth: 1931/07/24  Subjective/Objective:                  83 y.o. female with medical history significant for lung cancer, laryngeal cancer, depression who presented today with complaints of weakness, nausea, poor by mouth intake of several months duration,  With slow gradual weight loss over the past 2 years. Patient also reports worsening shortness of breath over the past few months, and increased production of yellowish sputum and chills without fever over the past month.  Patient also reports suprapubic pain, denies dysuria.   ED Course: blood pressure initially low 72/43, temperature 98.1, tachycardia 122. WBC normal at 10.5, stable hemoglobin- 13.2, Sodium 127, bicarbonate mildly low at 20, creatinine at baseline 0.6. UA- small leukocytes, few bacteria. Two-view chest x-ray- grossly stable right lower lobe mass. CTA chest was done to rule out PE- negative for pulmonary embolism, back showed increased right lower lobe consolidation, left lower lobe nodularity, left patchy airspace disease- suspicious for infection. She was given 1 L bolus in the ED with improvement in blood pressure, started on ceftriaxone and azithromycin for CAP.   Action/Plan: Date:  September 17, 2017 Chart reviewed for concurrent status and case management needs.  Will continue to follow patient progress.  Discharge Planning: following for needs  Expected discharge date: [id number]  Velva Harman, BSN, [place], [place]   Expected Discharge Date:   (unknown)               Expected Discharge Plan:  Home/Self Care  In-House Referral:     Discharge planning Services  CM Consult  Post Acute Care Choice:    Choice offered to:     DME Arranged:    DME Agency:     HH Arranged:    [place] Agency:     Status of Service:  In process, will continue to follow  If discussed at Long Length of Stay Meetings, dates discussed:     Additional Comments:  Leeroy Cha, RN 09/17/2017, 9:18 AM

## 2017-09-17 NOTE — Progress Notes (Signed)
PROGRESS NOTE  Kim Hardy XKG:[id number] DOB: 12-21-1931 DOA: 09/16/2017 PCP: [name], MD  HPI/Recap of past 24 hours:  No fever, denies pain, no active n/v, she continue to report not feeling well  Assessment/Plan: Principal Problem:   HCAP (healthcare-associated pneumonia) Active Problems:   laryngeal ca   Lung cancer ([place])   Protein-calorie malnutrition, severe   Pneumonia: postobstructive? Aspiration?  She presented with sob, tachypnea, sinus tachycardia, increase sputum production through chronic tracheostomy  WBC- 10.5. No fever. On room air. CTA on admission no PE, but with possible pneumonia -she received rocephin/zithro in the ED, abx changed to vanc/zosym on admission, blood culture/sputum culture pending  Hyponatremia:  Likely combination of dehydration and SIADH in the setting on pneumonia Sodium 127 on admission, continue hydration Sodium improving  Urine retention of 700cc with lower abdominal discomfort on admission , required in and out foley Continue monitor  Lung mass hx of lung cancer, appears to be increasing in size compared to CT scan 2016  H/o laryngeal cancer, detail unknown  N/V: report chronic but progressively get worse  Weight loss , Severe malnutrition in context of chronic illness Body mass index is 16.21 kg/m. Nutrition input appreciated  FTT: PT eval, may need snf placement , patient is open to snf placement if needed   Code Status: full  Family Communication: patient   Disposition Plan: pending clinical improvement and PT eval, may need snf placement   Consultants:  none  Procedures:  none  Antibiotics:  Rocephin/zithro x1 in the ED  Vanc/zosyn from admission   Objective: BP (!) 95/47 (BP Location: Right Arm)   Pulse (!) 105   Temp 98.7 F (37.1 C) (Oral)   Resp 20   Ht 5' (1.524 m)   Wt 37.6 kg (83 lb)   SpO2 91%   BMI 16.21 kg/m   Intake/Output Summary (Last 24 hours) at 09/17/17 0910 Last  data filed at 09/17/17 0849  Gross per 24 hour  Intake          1348.33 ml  Output                0 ml  Net          1348.33 ml   Filed Weights   09/16/17 1338  Weight: 37.6 kg (83 lb)    Exam: Patient is examined daily including today on 09/17/2017, exams remain the same as of yesterday except that has changed    General:  Thin, frail, s/p tracheostomy   Cardiovascular: RRR  Respiratory: coarse breath sounds, no weehzing  Abdomen: Soft/ND/NT, positive BS  Musculoskeletal: No Edema  Neuro: alert, oriented   Data Reviewed: Basic Metabolic Panel:  Recent Labs Lab 09/16/17 1442 09/17/17 0602  NA 127* 131*  K 4.8 3.6  CL 94* 100*  CO2 20* 20*  GLUCOSE 87 76  BUN 12 9  CREATININE 0.62 0.57  CALCIUM 8.7* 8.1*   Liver Function Tests: No results for input(s): AST, ALT, ALKPHOS, BILITOT, PROT, ALBUMIN in the last 168 hours. No results for input(s): LIPASE, AMYLASE in the last 168 hours. No results for input(s): AMMONIA in the last 168 hours. CBC:  Recent Labs Lab 09/16/17 1442 09/17/17 0602  WBC 10.5 9.2  HGB 13.2 12.2  HCT 38.7 36.5  MCV 91.9 93.1  PLT 321 307   Cardiac Enzymes:    Recent Labs Lab 09/16/17 1442  TROPONINI <0.03   BNP (last 3 results)  Recent Labs  09/16/17 1442  BNP 54.6    ProBNP (last 3 results) No results for input(s): PROBNP in the last 8760 hours.  CBG:  Recent Labs Lab 09/16/17 1444  GLUCAP 82    No results found for this or any previous visit (from the past 240 hour(s)).   Studies: Dg Chest 2 View  Result Date: 09/16/2017 CLINICAL DATA:  Shortness of breath, cough, lung cancer EXAM: CHEST  2 VIEW COMPARISON:  11/30/2015, 04/17/2015 FINDINGS: Normal heart size and vascularity. No effusion or pneumothorax. Ill-defined right lower lobe masslike opacity appears grossly stable by plain radiography. Vascular congestion noted with increased perihilar and bibasilar interstitial opacities concerning for chronic  interstitial lung disease versus edema. Normal heart size. Atherosclerosis of the aorta. Remote posterior right rib fractures. Atherosclerosis noted of the aorta. Degenerative changes of the spine. IMPRESSION: Grossly stable right lower lobe masslike opacity correlates with the previous right lung mass by CT. Increased hilar and bibasilar interstitial changes may represent chronic interstitial lung disease versus edema. Atherosclerosis Electronically Signed   By: Jerilynn Mages.  Shick M.D.   On: 09/16/2017 16:06   Ct Angio Chest Pe W Or Wo Contrast  Result Date: 09/16/2017 CLINICAL DATA:  Right-sided lung cancer with increased weakness for several days. Multiple falls. Tachycardic. Possible congestive heart failure. EXAM: CT ANGIOGRAPHY CHEST WITH CONTRAST TECHNIQUE: Multidetector CT imaging of the chest was performed using the standard protocol during bolus administration of intravenous contrast. Multiplanar CT image reconstructions and MIPs were obtained to evaluate the vascular anatomy. CONTRAST:  49 cc of Isovue [id number] COMPARISON:  09/16/2017 chest radiograph. Most recent CT of 11/30/2015. FINDINGS: Cardiovascular: The quality of this exam for evaluation of pulmonary embolism is moderate to good. Suboptimal opacification of pulmonary vascular branches in the right apex is favored to be due to bolus timing. These are difficult to follow but may be venous. Otherwise, no pulmonary embolism identified Advanced aortic and branch vessel atherosclerosis. Mild cardiomegaly with trace pericardial fluid. Multivessel coronary artery atherosclerosis. Mediastinum/Nodes: Tracheostomy. Mediastinal edema is chronic and likely radiation induced. No well-defined mediastinal adenopathy. No hilar adenopathy. Tiny hiatal hernia.  Mildly dilated upper esophagus is nonspecific. Lungs/Pleura: Similar small right pleural effusion. New trace left pleural fluid. Moderate centrilobular emphysema. Left apical mild ground-glass opacity on image  12/series 11 new. There is also dependent mild airspace disease within both upper lobes. There is favored to represent subsegmental atelectasis and is unchanged on the left. Irregular left upper lobe 7 mm density on image 33/series 11 is new. Mild left lower lobe airspace disease, some of which has a nodular component. Example images 45 and 47/series 11. The airspace disease is new, with the nodularity present back on 07/01/2017. Slight increasing and lower lobe consolidation, and 2016 including image 45/series 11. Right lower lobe consolidation, small surrounding ground-glass opacity and architectural distortion are felt to be similar to July of this year abdominal study. Upper Abdomen: Old granulomatous disease in the liver. Normal imaged portions of the spleen, pancreas, gallbladder, adrenal glands, kidneys. Musculoskeletal: Osteopenia. Nonacute posttraumatic or postsurgical deformities without posterolateral right ribs. Review of the MIP images confirms the above findings. IMPRESSION: 1.  No evidence of pulmonary embolism. 2. Right lower lobe consolidation is increased since 11/30/2015 but felt to be similar to 07/01/2017 abdominal study. This is most likely radiation induced. Given progression since the remote exam, locally recurrent disease cannot be entirely excluded but is felt unlikely. 3. Left lower lobe nodularity is similar back to July. Concurrent superimposed patchy airspace and ground-glass opacity is suspicious for  infection. Left upper lobe ground-glass and soft tissue nodularity could also be infectious or neoplastic. Consider antibiotic therapy and CT follow-up at 6-12 weeks. 4. Coronary artery atherosclerosis. Aortic Atherosclerosis (ICD10-I70.0). 5. Similar small right pleural effusion with new left pleural fluid since 2016. 6.  Emphysema (ICD10-J43.9). Electronically Signed   By: Abigail Miyamoto M.D.   On: 09/16/2017 17:47    Scheduled Meds: . aspirin EC  81 mg Oral Daily  .  dorzolamide-timolol  1 drop Right Eye BID  . enoxaparin (LOVENOX) injection  30 mg Subcutaneous Q24H  . famotidine  20 mg Oral Daily  . guaiFENesin  5 mL Oral Q6H  . pantoprazole  40 mg Oral Daily  . sertraline  25 mg Oral Daily  . sodium chloride HYPERTONIC  4 mL Nebulization BID    Continuous Infusions: . sodium chloride 100 mL/hr at 09/17/17 0630  . piperacillin-tazobactam (ZOSYN)  IV Stopped (09/17/17 0700)  . vancomycin Stopped (09/17/17 0000)     Time spent: 72mins I have personally reviewed and interpreted on  09/17/2017 daily labs, tele strips, imagings as discussed above under data review session and assessment and plans.  I reviewed all nursing notes, pharmacy notes,  vitals, pertinent old records  I have discussed plan of care as described above with RN , patient  on 09/17/2017   Raed Schalk MD, PhD  [facility] Pager [phone]. If 7PM-7AM, please contact night-coverage at [email or web address], password [facility] 09/17/2017, 9:10 AM  LOS: 1 day

## 2017-09-17 NOTE — Progress Notes (Signed)
PT Cancellation Note  Patient Details Name: Kim Hardy MRN: [id number] DOB: 05-07-1931   Cancelled Treatment:    Reason Eval/Treat Not Completed: Fatigue/lethargy limiting ability to participate Pt reports feeling tired and "pooped" from this morning.  Pt would like PT to check back tomorrow.   Aiyah Scarpelli,KATHrine E 09/17/2017, 11:03 AM Carmelia Bake, PT, DPT 09/17/2017 Pager: [phone]

## 2017-09-18 ENCOUNTER — Inpatient Hospital Stay: Admission: RE | Admit: 2017-09-18 | Payer: Medicare Other | Source: Ambulatory Visit

## 2017-09-18 DIAGNOSIS — Z8521 Personal history of malignant neoplasm of larynx: Secondary | ICD-10-CM

## 2017-09-18 DIAGNOSIS — C801 Malignant (primary) neoplasm, unspecified: Secondary | ICD-10-CM

## 2017-09-18 DIAGNOSIS — R627 Adult failure to thrive: Secondary | ICD-10-CM

## 2017-09-18 DIAGNOSIS — R338 Other retention of urine: Secondary | ICD-10-CM

## 2017-09-18 DIAGNOSIS — J189 Pneumonia, unspecified organism: Secondary | ICD-10-CM

## 2017-09-18 DIAGNOSIS — J181 Lobar pneumonia, unspecified organism: Secondary | ICD-10-CM

## 2017-09-18 DIAGNOSIS — E43 Unspecified severe protein-calorie malnutrition: Secondary | ICD-10-CM

## 2017-09-18 DIAGNOSIS — C3432 Malignant neoplasm of lower lobe, left bronchus or lung: Secondary | ICD-10-CM

## 2017-09-18 DIAGNOSIS — I739 Peripheral vascular disease, unspecified: Secondary | ICD-10-CM

## 2017-09-18 LAB — URINE CULTURE

## 2017-09-18 LAB — COMPREHENSIVE METABOLIC PANEL
ALT: 19 U/L (ref 14–54)
AST: 35 U/L (ref 15–41)
Albumin: 3.1 g/dL — ABNORMAL LOW (ref 3.5–5.0)
Alkaline Phosphatase: 63 U/L (ref 38–126)
Anion gap: 17 — ABNORMAL HIGH (ref 5–15)
BILIRUBIN TOTAL: 1.7 mg/dL — AB (ref 0.3–1.2)
BUN: 12 mg/dL (ref 6–20)
CO2: 16 mmol/L — ABNORMAL LOW (ref 22–32)
CREATININE: 0.55 mg/dL (ref 0.44–1.00)
Calcium: 8.6 mg/dL — ABNORMAL LOW (ref 8.9–10.3)
Chloride: 101 mmol/L (ref 101–111)
GFR calc Af Amer: >60 mL/min (ref >60)
Glucose, Bld: 94 mg/dL (ref 65–99)
Potassium: 4 mmol/L (ref 3.5–5.1)
Sodium: 134 mmol/L — ABNORMAL LOW (ref 135–145)
TOTAL PROTEIN: 6.8 g/dL (ref 6.5–8.1)

## 2017-09-18 LAB — MAGNESIUM: MAGNESIUM: 1.3 mg/dL — AB (ref 1.7–2.4)

## 2017-09-18 MED ORDER — TAMSULOSIN HCL 0.4 MG PO CAPS
0.4000 mg | ORAL_CAPSULE | Freq: Every day | ORAL | Status: DC
  Administered 2017-09-19 – 2017-09-23 (×5): 0.4 mg via ORAL
  Filled 2017-09-18 (×5): qty 1

## 2017-09-18 MED ORDER — HYDROCODONE-ACETAMINOPHEN 5-325 MG PO TABS
2.0000 | ORAL_TABLET | Freq: Once | ORAL | Status: AC
  Administered 2017-09-18: 2 via ORAL
  Filled 2017-09-18: qty 2

## 2017-09-18 MED ORDER — HYDROCODONE-ACETAMINOPHEN 5-325 MG PO TABS
1.0000 | ORAL_TABLET | ORAL | Status: DC | PRN
  Administered 2017-09-19 – 2017-09-20 (×3): 1 via ORAL
  Filled 2017-09-18 (×3): qty 1

## 2017-09-18 MED ORDER — CLONAZEPAM 0.125 MG PO TBDP
0.1250 mg | ORAL_TABLET | Freq: Two times a day (BID) | ORAL | Status: DC | PRN
  Administered 2017-09-18 – 2017-09-21 (×5): 0.125 mg via ORAL
  Filled 2017-09-18 (×5): qty 1

## 2017-09-18 MED ORDER — MAGNESIUM SULFATE 4 GM/100ML IV SOLN
4.0000 g | Freq: Once | INTRAVENOUS | Status: AC
  Administered 2017-09-18: 4 g via INTRAVENOUS
  Filled 2017-09-18: qty 100

## 2017-09-18 NOTE — Evaluation (Signed)
Physical Therapy Evaluation Patient Details Name: Kim Hardy MRN: [id number] DOB: 07/14/1931 Today's Date: 09/18/2017   History of Present Illness  83 y.o. female with medical history significant for lung cancer, laryngeal cancer, depression, tracheostomy and admitted with HCAP  Clinical Impression  Pt admitted with above diagnosis. Pt currently with functional limitations due to the deficits listed below (see PT Problem List).  Pt will benefit from skilled PT to increase their independence and safety with mobility to allow discharge to the venue listed below.  Approached by NT for assist with pt's bed mobility.  Pt down in the bed towards the right side.  Pt assisted with rolling to straighten bed pad and required increased assist for repositioning in bed.  Pt declined OOB to recliner today or any further mobility due to fatigue.  Pt with lunch placed in front of her after repositioning, however she pushed this aside and appeared to be writhing in pain pointing to bil LEs.  Pt reports she d/c to SNF after last admission and then was home receiving HHPT thereafter.  Pt reports decline in mobility a couple weeks prior to admission.  Recommend d/c to SNF at this time.     Follow Up Recommendations SNF;Supervision/Assistance - 24 hour    Equipment Recommendations  Rolling walker with 5" wheels (youth, recommended last admission however uncertain pt left SNF with RW)    Recommendations for Other Services       Precautions / Restrictions Precautions Precautions: Fall      Mobility  Bed Mobility Overal bed mobility: Needs Assistance Bed Mobility: Rolling Rolling: Max assist;+2 for physical assistance         General bed mobility comments: verbal cues for pt to assist however pt requiring extensive assist for rolling, pt unable to pull herself up HOB in supine requiring total assist  Transfers                    Ambulation/Gait                Stairs             Wheelchair Mobility    Modified Rankin (Stroke Patients Only)       Balance                                             Pertinent Vitals/Pain Pain Assessment: Faces Faces Pain Scale: Hurts even more Pain Location: bil LEs Pain Descriptors / Indicators: Sore;Grimacing Pain Intervention(s): Monitored during session;Repositioned;Patient requesting pain meds-RN notified    Home Living Family/patient expects to be discharged to:: Private residence Living Arrangements: Alone   Type of Home: [place]: One level Home Equipment: None      Prior Function Level of Independence: Independent         Comments: pt reports SNF after recent admission, states she was ambulating at home although not as much as she should, mostly in bed the past 1-2 weeks prior to admission     Hand Dominance        Extremity/Trunk Assessment   Upper Extremity Assessment Upper Extremity Assessment: Generalized weakness    Lower Extremity Assessment Lower Extremity Assessment: Generalized weakness (diffuse muscle atrophy throughout)       Communication   Communication: Tracheostomy (difficult to understand, uses writing to communicate)  Cognition Arousal/Alertness:  Awake/alert Behavior During Therapy: WFL for tasks assessed/performed Overall Cognitive Status: Within Functional Limits for tasks assessed                                        General Comments      Exercises     Assessment/Plan    PT Assessment Patient needs continued PT services  PT Problem List Decreased strength;Decreased mobility;Decreased activity tolerance;Decreased knowledge of use of DME       PT Treatment Interventions DME instruction;Gait training;Therapeutic activities;Therapeutic exercise;Functional mobility training;Patient/family education;Balance training    PT Goals (Current goals can be found in the Care Plan section)  Acute Rehab PT  Goals Patient Stated Goal: agreeable to acute PT PT Goal Formulation: With patient Time For Goal Achievement: 09/25/17 Potential to Achieve Goals: Good    Frequency Min 3X/week   Barriers to discharge        Co-evaluation               AM-PAC PT "6 Clicks" Daily Activity  Outcome Measure Difficulty turning over in bed (including adjusting bedclothes, sheets and blankets)?: Unable Difficulty moving from lying on back to sitting on the side of the bed? : Unable Difficulty sitting down on and standing up from a chair with arms (e.g., wheelchair, bedside commode, etc,.)?: Unable Help needed moving to and from a bed to chair (including a wheelchair)?: Total Help needed walking in hospital room?: Total Help needed climbing 3-5 steps with a railing? : Total 6 Click Score: 6    End of Session   Activity Tolerance: Patient limited by fatigue;Patient limited by pain Patient left: in bed;with call bell/phone within reach;with bed alarm set   PT Visit Diagnosis: Muscle weakness (generalized) (M62.81);Difficulty in walking, not elsewhere classified (R26.2)    Time: 1250-1303 PT Time Calculation (min) (ACUTE ONLY): 13 min   Charges:   PT Evaluation $PT Eval Low Complexity: 1 Low     PT G Codes:        Carmelia Bake, PT, DPT 09/18/2017 Pager: [phone]  York Ram E 09/18/2017, 2:07 PM

## 2017-09-18 NOTE — Progress Notes (Addendum)
Pt tolerated approximately 5 mins of manuel CPT.  Therapy stopped due to pt being unable to tolerate anymore due to pain.  RT to monitor and assess as needed.

## 2017-09-18 NOTE — Progress Notes (Addendum)
PROGRESS NOTE  Kim Hardy EGB:[id number] DOB: Sep 30, 1931 DOA: 09/16/2017 PCP: [name], MD  HPI/Recap of past 24 hours:  She has hypoxia last night, she received deep suction through stoma by respiratory last night,  She continue to have urinary retention that required in and out foley last night with 650cc out No fever, she denies pain this am, no n/v   Assessment/Plan: Principal Problem:   HCAP (healthcare-associated pneumonia) Active Problems:   laryngeal ca   Lung cancer ([place])   Protein-calorie malnutrition, severe   Lobar Pneumonia/acute hypoxia respiratory failure, likely from mucus plugging,  -She received deep suction through stoma ( with thick mucus suctioned out), o2 improved after suction, will start chest PT -pneumonia possible due to postobstructive+/- Aspiration (with chronic n/v) -She presented with sob, tachypnea, sinus tachycardia, increase sputum production through chronic tracheostomy  -WBC- 10.5. No fever. On room air. -CTA on admission no PE, but with possible pneumonia,  -she received rocephin/zithro in the ED, abx changed to vanc/zosym on admission, blood culture no growth, sputum culture in process, d/c vanc on 9/20, continue zosyn for now  Hyponatremia:  Likely combination of dehydration and SIADH in the setting on pneumonia Sodium 127 on admission, continue hydration Sodium improving, 134 today  Hypomagnesemia: replace mag.  N/V: report chronic but progressively get worse None observed in the hospital, she dose has very poor oral intake She is on prn antiemetics   Urine retention of 700cc with lower abdominal discomfort on admission , required in and out foley Continue monitor Required another in and out last night  Treat constipation, start trial of flomax  Lung mass hx of lung cancer, appears to be increasing in size compared to CT scan 2016  oncology Dr [name] consulted  H/o laryngeal cancer, detail unknown   Weight loss ,  Severe malnutrition in context of chronic illness Body mass index is 16.21 kg/m. Nutrition input appreciated  FTT: PT eval, may need snf placement , patient is open to snf placement if needed   Code Status: full  Family Communication: patient   Disposition Plan: pending clinical improvement and PT eval, may need snf placement   Consultants:  oncology  Procedures:  none  Antibiotics:  Rocephin/zithro x1 in the ED  Vanc from admission to 9/20  zosyn from admission   Objective: BP 115/73 (BP Location: Left Arm)   Pulse (!) 104   Temp 97.7 F (36.5 C) (Oral)   Resp 20   Ht 5' (1.524 m)   Wt 37.6 kg (83 lb)   SpO2 92%   BMI 16.21 kg/m   Intake/Output Summary (Last 24 hours) at 09/18/17 3710 Last data filed at 09/18/17 0608  Gross per 24 hour  Intake          1618.75 ml  Output              650 ml  Net           968.75 ml   Filed Weights   09/16/17 1338  Weight: 37.6 kg (83 lb)    Exam: Patient is examined daily including today on 09/18/2017, exams remain the same as of yesterday except that has changed    General:  Thin, frail, s/p tracheostomy   Cardiovascular: RRR  Respiratory: breath sound has much improved, improved aeration, no wheezing, no rhonchi, no rales  Abdomen: Soft/ND/NT, positive BS  Musculoskeletal: No Edema  Neuro: alert, oriented   Data Reviewed: Basic Metabolic Panel:  Recent Labs Lab 09/16/17  1442 09/17/17 0602 09/18/17 0625  NA 127* 131* 134*  K 4.8 3.6 4.0  CL 94* 100* 101  CO2 20* 20* 16*  GLUCOSE 87 76 94  BUN 12 9 12   CREATININE 0.62 0.57 0.55  CALCIUM 8.7* 8.1* 8.6*  MG  --   --  1.3*   Liver Function Tests:  Recent Labs Lab 09/18/17 0625  AST 35  ALT 19  ALKPHOS 63  BILITOT 1.7*  PROT 6.8  ALBUMIN 3.1*   No results for input(s): LIPASE, AMYLASE in the last 168 hours. No results for input(s): AMMONIA in the last 168 hours. CBC:  Recent Labs Lab 09/16/17 1442 09/17/17 0602  WBC 10.5 9.2    HGB 13.2 12.2  HCT 38.7 36.5  MCV 91.9 93.1  PLT 321 307   Cardiac Enzymes:    Recent Labs Lab 09/16/17 1442  TROPONINI <0.03   BNP (last 3 results)  Recent Labs  09/16/17 1442  BNP 54.6    ProBNP (last 3 results) No results for input(s): PROBNP in the last 8760 hours.  CBG:  Recent Labs Lab 09/16/17 1444  GLUCAP 82    Recent Results (from the past 240 hour(s))  Culture, Urine     Status: Abnormal   Collection Time: 09/16/17  2:23 PM  Result Value Ref Range Status   Specimen Description URINE, RANDOM  Final   Special Requests NONE  Final   Culture MULTIPLE SPECIES PRESENT, SUGGEST RECOLLECTION (A)  Final   Report Status 09/18/2017 FINAL  Final  Culture, blood (Routine X 2) w Reflex to ID Panel     Status: None (Preliminary result)   Collection Time: 09/16/17  4:06 PM  Result Value Ref Range Status   Specimen Description BLOOD RIGHT FOREARM  Final   Special Requests   Final    BOTTLES DRAWN AEROBIC AND ANAEROBIC Blood Culture adequate volume   Culture   Final    NO GROWTH < 24 HOURS Performed at [facility], [place] [address], [place], [place] [address]    Report Status PENDING  Incomplete  Culture, blood (Routine X 2) w Reflex to ID Panel     Status: None (Preliminary result)   Collection Time: 09/16/17  4:18 PM  Result Value Ref Range Status   Specimen Description BLOOD LEFT ANTECUBITAL  Final   Special Requests   Final    BOTTLES DRAWN AEROBIC AND ANAEROBIC Blood Culture adequate volume   Culture   Final    NO GROWTH < 24 HOURS Performed at [facility], [place] [address], [place], [place] [address]    Report Status PENDING  Incomplete  MRSA PCR Screening     Status: None   Collection Time: 09/17/17  5:25 AM  Result Value Ref Range Status   MRSA by PCR NEGATIVE NEGATIVE Final    Comment:        The GeneXpert MRSA Assay (FDA approved for NASAL specimens only), is one component of a comprehensive MRSA colonization surveillance program. It  is not intended to diagnose MRSA infection nor to guide or monitor treatment for MRSA infections.   Culture, expectorated sputum-assessment     Status: None   Collection Time: 09/17/17  9:07 AM  Result Value Ref Range Status   Specimen Description SPUTUM  Final   Special Requests NONE  Final   Sputum evaluation THIS SPECIMEN IS ACCEPTABLE FOR SPUTUM CULTURE  Final   Report Status 09/17/2017 FINAL  Final  Culture, respiratory (NON-Expectorated)  Status: None (Preliminary result)   Collection Time: 09/17/17  9:07 AM  Result Value Ref Range Status   Specimen Description SPUTUM  Final   Special Requests NONE Reflexed from [id number]  Final   Gram Stain   Final    DEGENERATED CELLULAR MATERIAL PRESENT NO WBC IDENTIFIED NO ORGANISMS SEEN    Culture   Final    NO GROWTH < 24 HOURS Performed at [facility], [address], [place], [place] [address]    Report Status PENDING  Incomplete     Studies: No results found.  Scheduled Meds: . aspirin EC  81 mg Oral Daily  . dorzolamide-timolol  1 drop Right Eye BID  . enoxaparin (LOVENOX) injection  30 mg Subcutaneous Q24H  . famotidine  20 mg Oral Daily  . guaiFENesin  5 mL Oral Q6H  . lactose free nutrition  237 mL Oral TID WC  . pantoprazole  40 mg Oral Daily  . polyethylene glycol  17 g Oral Daily  . senna  1 tablet Oral BID  . sertraline  25 mg Oral Daily  . sodium chloride HYPERTONIC  4 mL Nebulization BID    Continuous Infusions: . 0.9 % NaCl with KCl 20 mEq / L 75 mL/hr at 09/18/17 0154  . piperacillin-tazobactam (ZOSYN)  IV Stopped (09/18/17 0600)     Time spent: 38mins I have personally reviewed and interpreted on  09/18/2017 daily labs, tele strips, imagings as discussed above under data review session and assessment and plans.  I reviewed all nursing notes, pharmacy notes,  vitals, pertinent old records  I have discussed plan of care as described above with RN , patient  on 09/18/2017   Jeanett Antonopoulos MD,  PhD  [facility] Pager [phone]. If 7PM-7AM, please contact night-coverage at [email or web address], password [facility] 09/18/2017, 9:42 AM  LOS: 2 days

## 2017-09-18 NOTE — Progress Notes (Signed)
Vitals taken, heart rate elevated and oxygen saturation down (88% on room air), pt has become confused overnight which is not her baseline. Respiratory paged, deep suctioned stoma, sats up to 93% on room air, Schorr paged about confusion. Instructed to bladder scan, bladder scan reading 510 cc of residual urine in bladder. Instructed to in and out cath. 650 cc of clear, yellow urine immediately returned. Will continue to monitor at this time.

## 2017-09-18 NOTE — Progress Notes (Signed)
NUTRITION NOTE  Consult for assessment of nutrition requirements and status received from last night ~9PM. Pt was seen by another RD for initial assessment yesterday with associated note at 10:17 AM. Pt was found to have severe malnutrition in the context of chronic illness based on energy intakes </= 50% for >/= 1 month, severe depletion of body fat, severe depletion of muscle mass, 8% wt loss in 2 months. Boost Plus was ordered TID.   RD will continue to follow per protocol.    Jarome Matin, MS, RD, LDN, [facility] Inpatient Clinical Dietitian Pager # [phone] After hours/weekend pager # [phone]

## 2017-09-19 ENCOUNTER — Inpatient Hospital Stay (HOSPITAL_COMMUNITY): Payer: Medicare Other

## 2017-09-19 ENCOUNTER — Institutional Professional Consult (permissible substitution): Payer: Medicare Other | Admitting: Pulmonary Disease

## 2017-09-19 LAB — BASIC METABOLIC PANEL
ANION GAP: 12 (ref 5–15)
BUN: 21 mg/dL — ABNORMAL HIGH (ref 6–20)
CALCIUM: 8 mg/dL — AB (ref 8.9–10.3)
CO2: 16 mmol/L — ABNORMAL LOW (ref 22–32)
Chloride: 104 mmol/L (ref 101–111)
Creatinine, Ser: 0.67 mg/dL (ref 0.44–1.00)
Glucose, Bld: 83 mg/dL (ref 65–99)
POTASSIUM: 4 mmol/L (ref 3.5–5.1)
SODIUM: 132 mmol/L — AB (ref 135–145)

## 2017-09-19 LAB — CULTURE, RESPIRATORY W GRAM STAIN

## 2017-09-19 LAB — CULTURE, RESPIRATORY: CULTURE: NO GROWTH

## 2017-09-19 LAB — MAGNESIUM: MAGNESIUM: 2.2 mg/dL (ref 1.7–2.4)

## 2017-09-19 MED ORDER — FUROSEMIDE 10 MG/ML IJ SOLN
40.0000 mg | Freq: Once | INTRAMUSCULAR | Status: AC
  Administered 2017-09-19: 40 mg via INTRAVENOUS
  Filled 2017-09-19: qty 4

## 2017-09-19 NOTE — Clinical Social Work Note (Signed)
Clinical Social Work Assessment  Patient Details  Name: Kim Hardy MRN: [id number] Date of Birth: 12/13/1931  Date of referral:  09/19/17               Reason for consult:  Facility Placement                Permission sought to share information with:  Family Supports Permission granted to share information::  Yes, Verbal Permission Granted  Name::        Agency::     Relationship::  Niece   Contact Information:     Housing/Transportation Living arrangements for the past 2 months:  Single Family Home Source of Information:  Other (Comment Required) (Other family members) Patient Interpreter Needed:  None Criminal Activity/Legal Involvement Pertinent to Current Situation/Hospitalization:  No - Comment as needed Significant Relationships:  Other Family Members (Niece) Lives with:  Self Do you feel safe going back to the place where you live?  Yes Need for family participation in patient care:  Yes (Comment)  Care giving concerns:  Patient niece is concern about the patient living alone, unable to communicate needs because of her Lurline Idol and signs of dementia.  Social Worker assessment / plan:  Patient gave CSW permission to talk with her niece for collateral information. Niece report it is becoming difficult for the patient to care for herself but she is not ready to give up her independence. The niece helps with making sure pt. bills are paid. She reports they have conversed about long term care home and services but pt. Is not receptive. She reports the patient completed short term rehab in July and did well at the facility. The patient discharged with home health and given equipment, the patient did not know how to use it.  Patient niece is agreeable to short term rehab. CSW explained faxing out process and later provide bed offer.    Plan: Assist w/ discharge to SNF Complete fl2, check PASRR.    Employment status:    Nurse, adult PT Recommendations:   24 Hour Supervision, [place] / Referral to community resources:     Patient/Family's Response to care:  Agreeable to care and responding to care.   Patient/Family's Understanding of and Emotional Response to Diagnosis, Current Treatment, and Prognosis:  " I am worried about her health and prognosis." Patient niece involved in patient care." Patient niece is concerned about patient health and future needs. She report the pt. Is not ready to give up her independence.    Emotional Assessment Appearance:  Appears stated age Attitude/Demeanor/Rapport:    Affect (typically observed):  Accepting, Calm Orientation:  Oriented to Self, Oriented to Situation Alcohol / Substance use:  Not Applicable Psych involvement (Current and /or in the community):  No (Comment)  Discharge Needs  Concerns to be addressed:  Discharge Planning Concerns, Care Coordination Readmission within the last 30 days:  No Current discharge risk:  Dependent with Mobility Barriers to Discharge:  Continued Medical Work up   [employer], LCSW 09/19/2017, 10:57 AM

## 2017-09-19 NOTE — Progress Notes (Signed)
Nurse call and asked me to check the Pt in 1504. Pt has a larytube/stoma. I told the nurse I had check her earlier and gave her a hypertonic Tx..  The nurse called again before I was able to see the Pt and stated that the hospitalist needed me in room 1504. The hospitalist was in another room so I entered 1504 and she had a large amount of secretion around her site and I suction a large amount from her stoma. I asked the nurse why the Pt hadnt been suctioned/ or did she feel comfortable suctioning and she said she would like to be educated on suctioning again. I told the nurse the next time I suctioned I would let her suction.  The pt was very agitated because her legs were hurting. I let the nurse know that the Pt was want something for pain. The nurse said she had just given the Pt pain meds 35 minutes earlier. Rt will continue to monitor. The Pt's SATS were 85% when I entered the room, labored and very agitated.

## 2017-09-19 NOTE — Progress Notes (Addendum)
SLP Cancellation Note  Patient Details Name: [name] MRN: [id number] DOB: Oct 08, 1931   Cancelled treatment:       Reason Eval/Treat Not Completed: Other (comment). SLP consulted due to poor pt voicing noticed by MD in setting of laryngectomy.  Reviewed pts history, including transesophageal puncture (TEP) and prosthesis placement and treatment at [facility]. Pt has had difficulty with TEP malfunction in the past. Upon visiting pt, she was in distress, trying to call out for help due to pain. SLP informed RN and briefly was able to determine pt felt her TEP was "too far out." She is not able to get adequate voice with it and confirms she usually communicates much better. Offered pt an electrolarynx to borrow to compensate, but she refused. Will bring her a white board in the meantime. Also called Dr. [name] who has treated her in past for some clarification. If he gets back to me I will let Dr. Erlinda Hong know if an ENT consult would be helpful.   [phone] - pager [phone] - cell Santhiago Collingsworth, Katherene Ponto 09/19/2017, 12:50 PM

## 2017-09-19 NOTE — Progress Notes (Signed)
0800 Rt tried to do CPT and the Pt refused. The Pt was clearing her secretion and was diminishe with rh. Rt will continue to monitor
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
## 2017-09-19 NOTE — Consult Note (Signed)
Marland Kitchen    HEMATOLOGY/ONCOLOGY CONSULTATION NOTE  Date of Service: 09/18/2017  Patient Care Team: [name], MD as PCP - General (Internal Medicine)  CHIEF COMPLAINTS/PURPOSE OF CONSULTATION:  H/o lung cancer with lung abnormalities on imaging.  HISTORY OF PRESENTING ILLNESS:   Kim Hardy is a  83 y.o. female who has been referred to [place] by Dr Erlinda Hong, Annamaria Boots, MD for evaluation of  lung abnormalities on imaging in patient with previous h/o lung cancer and laryngeal cancer.  He has a history of non-small cell lung cancer status post SBRT April 2012 Right lower lobe lung. She follows with Dr. [name] for radiation oncology. She also has a history of laryngeal cancer status post surgery in 1997. Patient has a history of peripheral vascular disease dyslipidemia.  She has been admitted to the hospital on 09/16/2017 with no weakness nausea and shortness of breath. She has also had some progressive weight loss over the last 1-2 yrs. In the emergency room she was noted to be hypotensive with blood pressure of about 70/40 and sodium level of 127 with no overt evidence of bleeding. A CTA of the chest was done to rule out PE and was negative for pulmonary embolism. Right lower lobe consolidation is increased since 11/30/2015 but  felt to be similar to 07/01/2017 abdominal study. This is most likely radiation induced. Left lower lobe nodularity is similar back to July. Concurrent superimposed patchy airspace and ground-glass opacity is suspicious for infection. Left upper lobe ground-glass and soft tissue nodularity could also be infectious or neoplastic. Consider antibiotic therapy and CT follow-up at 6-12 weeks.  Patient is unable to provide much information and is unable to speak due to her post-laryngotomy status with stoma and there was no family at bedside. At this point she is being getting treated for pneumonia and some element of respiratory failure from mucous plugging. That is also concern for  recurrent aspirations.  She has very poor functional status with severe malnutrition in the setting of chronic illness. Has had issues with electrolyte abnormalities urinary retention and very limited functional status.   MEDICAL HISTORY:  Past Medical History:  Diagnosis Date  . Allergy    sulfa-hives, boniva=tremors,statins=n/v  . Diverticulosis 04/22/16   ct abd/pelvis  . History of radiation therapy 04/01/2011- 4/11/212   right lung  lower lobe  . Hypercholesterolemia   . laryngeal ca dx'd 1997   surg only  . Lung cancer ([place]) dx'd 01-2011   xrt comp 03/2011  . Peripheral vascular disease ([place])     SURGICAL HISTORY: Past Surgical History:  Procedure Laterality Date  . LARYNGECTOMY  1997  . TRACHEOSTOMY    . VOICE PROSTHESIS      SOCIAL HISTORY: Social History   Social History  . Marital status: Divorced    Spouse name: N/A  . Number of children: N/A  . Years of education: N/A   Occupational History  . Not on file.   Social History Main Topics  . Smoking status: Former Smoker    Packs/day: 1.00    Years: 20.00    Quit date: 03/30/1996  . Smokeless tobacco: Never Used     Comment: quit w/dx laryngeal cancer 1997  . Alcohol use No  . Drug use: No  . Sexual activity: Not on file   Other Topics Concern  . Not on file   Social History Narrative  . No narrative on file    FAMILY HISTORY: No family history on file.  ALLERGIES:  is allergic to anesthetics, amide; boniva [ibandronate sodium]; ciprofloxacin; levofloxacin; metronidazole; statins; and sulfa antibiotics.  MEDICATIONS:  Current Facility-Administered Medications  Medication Dose Route Frequency Provider Last Rate Last Dose  . albuterol (PROVENTIL) (2.5 MG/3ML) 0.083% nebulizer solution 2.5 mg  2.5 mg Nebulization Q6H PRN Emokpae, Ejiroghene E, MD      . aspirin EC tablet 81 mg  81 mg Oral Daily Emokpae, Ejiroghene E, MD   81 mg at 09/19/17 1000  . clonazepam (KLONOPIN) disintegrating tablet 0.125 mg   0.125 mg Oral BID PRN Florencia Reasons, MD   0.125 mg at 09/19/17 1139  . dorzolamide-timolol (COSOPT) 22.3-6.8 MG/ML ophthalmic solution 1 drop  1 drop Right Eye BID Emokpae, Ejiroghene E, MD   1 drop at 09/19/17 1139  . enoxaparin (LOVENOX) injection 30 mg  30 mg Subcutaneous Q24H Emokpae, Ejiroghene E, MD   30 mg at 09/19/17 0839  . famotidine (PEPCID) tablet 20 mg  20 mg Oral Daily Emokpae, Ejiroghene E, MD   20 mg at 09/19/17 1030  . guaiFENesin (ROBITUSSIN) 100 MG/5ML solution 100 mg  5 mL Oral Q6H Emokpae, Ejiroghene E, MD   100 mg at 09/19/17 1000  . HYDROcodone-acetaminophen (NORCO/VICODIN) 5-325 MG per tablet 1 tablet  1 tablet Oral Q4H PRN Schorr, Rhetta Mura, NP   1 tablet at 09/19/17 0858  . lactose free nutrition (BOOST PLUS) liquid 237 mL  237 mL Oral TID WC Florencia Reasons, MD   237 mL at 09/19/17 0800  . lip balm (CARMEX) ointment   Topical PRN Emokpae, Ejiroghene E, MD      . ondansetron (ZOFRAN) injection 4 mg  4 mg Intravenous Q6H PRN Florencia Reasons, MD   4 mg at 09/19/17 1033  . pantoprazole (PROTONIX) EC tablet 40 mg  40 mg Oral Daily Emokpae, Ejiroghene E, MD   40 mg at 09/19/17 1030  . piperacillin-tazobactam (ZOSYN) IVPB 3.375 g  3.375 g Intravenous Q8H Dara Hoyer, [facility]   Stopped at 09/19/17 1036  . polyethylene glycol (MIRALAX / GLYCOLAX) packet 17 g  17 g Oral Daily Florencia Reasons, MD   17 g at 09/19/17 1030  . promethazine (PHENERGAN) tablet 12.5 mg  12.5 mg Oral Q8H PRN Florencia Reasons, MD   12.5 mg at 09/17/17 1330  . senna (SENOKOT) tablet 8.6 mg  1 tablet Oral BID Florencia Reasons, MD   8.6 mg at 09/19/17 1030  . sertraline (ZOLOFT) tablet 25 mg  25 mg Oral Daily Emokpae, Ejiroghene E, MD   25 mg at 09/19/17 1030  . sodium chloride HYPERTONIC 3 % nebulizer solution 4 mL  4 mL Nebulization BID Emokpae, Ejiroghene E, MD   4 mL at 09/19/17 0759  . tamsulosin (FLOMAX) capsule 0.4 mg  0.4 mg Oral Daily Florencia Reasons, MD   0.4 mg at 09/19/17 1030    REVIEW OF SYSTEMS:    10 Point review of Systems was done is  negative except as noted above.  PHYSICAL EXAMINATION: ECOG PERFORMANCE STATUS: 3 - Symptomatic, >50% confined to bed  . Vitals:   09/19/17 1400 09/19/17 1605  BP: 93/67   Pulse: (!) 111 92  Resp: 18 18  Temp: 98.1 F (36.7 C)   SpO2: 100% 98%   Filed Weights   09/16/17 1338  Weight: 83 lb (37.6 kg)   .Body mass index is 16.21 kg/m.  GENERAL:alert, in no acute distress  SKIN: no acute rashes, no significant lesions EYES: conjunctiva are pink and non-injected, sclera anicteric OROPHARYNX: MMM, no  exudates, no oropharyngeal erythema or ulceration NECK: supple, no JVD, tracheostomy  LYMPH:  no palpable lymphadenopathy in the cervical, axillary or inguinal regions LUNGS: b/l coarse breath sounds. HEART: regular rate & rhythm ABDOMEN:  normoactive bowel sounds , non tender, not distended. Extremity: no pedal edema PSYCH: alert . NEURO: moving all 4 extremities  LABORATORY DATA:  I have reviewed the data as listed  . CBC Latest Ref Rng & Units 09/17/2017 09/16/2017 07/04/2017  WBC 4.0 - 10.5 K/uL 9.2 10.5 8.7  Hemoglobin 12.0 - 15.0 g/dL 12.2 13.2 12.8  Hematocrit 36.0 - 46.0 % 36.5 38.7 37.1  Platelets 150 - 400 K/uL 307 321 350    . CMP Latest Ref Rng & Units 09/19/2017 09/18/2017 09/17/2017  Glucose 65 - 99 mg/dL 83 94 76  BUN 6 - 20 mg/dL 21(H) 12 9  Creatinine 0.44 - 1.00 mg/dL 0.67 0.55 0.57  Sodium 135 - 145 mmol/L 132(L) 134(L) 131(L)  Potassium 3.5 - 5.1 mmol/L 4.0 4.0 3.6  Chloride 101 - 111 mmol/L 104 101 100(L)  CO2 22 - 32 mmol/L 16(L) 16(L) 20(L)  Calcium 8.9 - 10.3 mg/dL 8.0(L) 8.6(L) 8.1(L)  Total Protein 6.5 - 8.1 g/dL - 6.8 -  Total Bilirubin 0.3 - 1.2 mg/dL - 1.7(H) -  Alkaline Phos 38 - 126 U/L - 63 -  AST 15 - 41 U/L - 35 -  ALT 14 - 54 U/L - 19 -     RADIOGRAPHIC STUDIES: I have personally reviewed the radiological images as listed and agreed with the findings in the report. Dg Chest 2 View  Result Date: 09/16/2017 CLINICAL DATA:  Shortness  of breath, cough, lung cancer EXAM: CHEST  2 VIEW COMPARISON:  11/30/2015, 04/17/2015 FINDINGS: Normal heart size and vascularity. No effusion or pneumothorax. Ill-defined right lower lobe masslike opacity appears grossly stable by plain radiography. Vascular congestion noted with increased perihilar and bibasilar interstitial opacities concerning for chronic interstitial lung disease versus edema. Normal heart size. Atherosclerosis of the aorta. Remote posterior right rib fractures. Atherosclerosis noted of the aorta. Degenerative changes of the spine. IMPRESSION: Grossly stable right lower lobe masslike opacity correlates with the previous right lung mass by CT. Increased hilar and bibasilar interstitial changes may represent chronic interstitial lung disease versus edema. Atherosclerosis Electronically Signed   By: Jerilynn Mages.  Shick M.D.   On: 09/16/2017 16:06   Ct Angio Chest Pe W Or Wo Contrast  Result Date: 09/16/2017 CLINICAL DATA:  Right-sided lung cancer with increased weakness for several days. Multiple falls. Tachycardic. Possible congestive heart failure. EXAM: CT ANGIOGRAPHY CHEST WITH CONTRAST TECHNIQUE: Multidetector CT imaging of the chest was performed using the standard protocol during bolus administration of intravenous contrast. Multiplanar CT image reconstructions and MIPs were obtained to evaluate the vascular anatomy. CONTRAST:  49 cc of Isovue [id number] COMPARISON:  09/16/2017 chest radiograph. Most recent CT of 11/30/2015. FINDINGS: Cardiovascular: The quality of this exam for evaluation of pulmonary embolism is moderate to good. Suboptimal opacification of pulmonary vascular branches in the right apex is favored to be due to bolus timing. These are difficult to follow but may be venous. Otherwise, no pulmonary embolism identified Advanced aortic and branch vessel atherosclerosis. Mild cardiomegaly with trace pericardial fluid. Multivessel coronary artery atherosclerosis. Mediastinum/Nodes:  Tracheostomy. Mediastinal edema is chronic and likely radiation induced. No well-defined mediastinal adenopathy. No hilar adenopathy. Tiny hiatal hernia.  Mildly dilated upper esophagus is nonspecific. Lungs/Pleura: Similar small right pleural effusion. New trace left pleural fluid. Moderate centrilobular emphysema. Left  apical mild ground-glass opacity on image 12/series 11 new. There is also dependent mild airspace disease within both upper lobes. There is favored to represent subsegmental atelectasis and is unchanged on the left. Irregular left upper lobe 7 mm density on image 33/series 11 is new. Mild left lower lobe airspace disease, some of which has a nodular component. Example images 45 and 47/series 11. The airspace disease is new, with the nodularity present back on 07/01/2017. Slight increasing and lower lobe consolidation, and 2016 including image 45/series 11. Right lower lobe consolidation, small surrounding ground-glass opacity and architectural distortion are felt to be similar to July of this year abdominal study. Upper Abdomen: Old granulomatous disease in the liver. Normal imaged portions of the spleen, pancreas, gallbladder, adrenal glands, kidneys. Musculoskeletal: Osteopenia. Nonacute posttraumatic or postsurgical deformities without posterolateral right ribs. Review of the MIP images confirms the above findings. IMPRESSION: 1.  No evidence of pulmonary embolism. 2. Right lower lobe consolidation is increased since 11/30/2015 but felt to be similar to 07/01/2017 abdominal study. This is most likely radiation induced. Given progression since the remote exam, locally recurrent disease cannot be entirely excluded but is felt unlikely. 3. Left lower lobe nodularity is similar back to July. Concurrent superimposed patchy airspace and ground-glass opacity is suspicious for infection. Left upper lobe ground-glass and soft tissue nodularity could also be infectious or neoplastic. Consider antibiotic  therapy and CT follow-up at 6-12 weeks. 4. Coronary artery atherosclerosis. Aortic Atherosclerosis (ICD10-I70.0). 5. Similar small right pleural effusion with new left pleural fluid since 2016. 6.  Emphysema (ICD10-J43.9). Electronically Signed   By: Abigail Miyamoto M.D.   On: 09/16/2017 17:47   Dg Chest Port 1 View  Result Date: 09/19/2017 CLINICAL DATA:  Shortness of breath EXAM: PORTABLE CHEST 1 VIEW COMPARISON:  September 16, 2017 FINDINGS: A right lower lobe masslike opacities again identified consistent with the patient's known malignancy. There is diffuse interstitial opacity today, new in the interval. The heart, hila, and mediastinum are unchanged. No pneumothorax. IMPRESSION: 1. New diffuse interstitial opacities suggest edema or atypical infection. 2. The patient's known mass in the medial right lower lobe is again identified, partially obscured by the diffuse interstitial opacities. Electronically Signed   By: Dorise Bullion III M.D   On: 09/19/2017 10:07    ASSESSMENT & PLAN:   83 year old female with multiple medical comorbidities with very poor performance status of 3-4 with previous history of non-small cell lung cancer in the right lower lobe treated with SBRT in April 2012 by Dr. [name]. Remote history of larynx cancer status post laryngectomy in 1997 admitted with  1) Pneumonia - consent for chronic aspiration. Issues with intermittent hypoxia Episodes from mucous plugging of her tracheostomy. 2) right lower lobe changes thought to be related to previous radiation low likelihood but cannot rule out recurrent tumor. 3) left lung nodularity is similar back to July. Concurrent superimposed patchy airspace and ground-glass opacity is suspicious for infection. Left upper lobe ground-glass and soft tissue nodularity could also be infectious or neoplastic. Plan -Lung findings could likely represent radiation necrosis on the right side and pneumonia/aspiration pneumonitis in the left . CT  scan is not definitive for active lung cancer at this time but will need follow-up after treatment of her pneumonia . -Would not recommend any biopsies for tissue diagnosis at this time . -Patient has very poor performance status and would not be a candidate for palliative chemotherapy even if there were recurrent lung cancer . -Would recommend defining detailed  goals of care with the patient's healthcare proxy and family . -She has a lot of competing health priorities which would need to be taken into account to determine need for additional imaging and considerations for biopsy or treatment . -If the patient's goals of care to dictate follow-up for radiographic monitoring of lung cancer would recommend she follow-up with Dr. [name] in 8-12 weeks after treatment of recurrent pneumonia with consideration of repeat CT of the chest or if more data is needed at that point consideration of PET/CT scan . -Other issues including urinary retention elect to let abnormalities pneumonia being managed by hospitalist . -No other oncologic recommendations at this time .  4)  Patient Active Problem List   Diagnosis Date Noted  . HCAP (healthcare-associated pneumonia) 09/16/2017  . Protein-calorie malnutrition, severe 07/04/2017  . Nausea 07/02/2017  . Osteoarthritis of right hip 07/02/2017  . UTI (urinary tract infection) 07/01/2017  . Elevated blood pressure reading 04/14/2017  . PVD (peripheral vascular disease) ([place]) 09/20/2016  . Bilateral hand numbness 09/11/2016  . Right leg pain 08/12/2016  . Hypertrophic toenail 08/12/2016  . Anorexia 07/10/2016  . Loss of weight 07/10/2016  . Depression 07/10/2016  . Hypertriglyceridemia 03/08/2016  . Colitis 03/08/2016  . laryngeal ca   . Lung cancer [facility])   -She will continue to follow-up with primary care physician for management of other medical comorbidities.  All of the patients questions were answered with apparent satisfaction. The patient knows to  call the clinic with any problems, questions or concerns.  I spent 45 minutes counseling the patient face to face. The total time spent in the appointment was 70 minutes and more than 50% was on counseling and direct patient cares.    Sullivan Lone MD [place] AAHIVMS [facility] Hematology/Oncology Physician [facility]  (Office):       [phone] (Work cell):  [phone] (Fax):           [phone]

## 2017-09-19 NOTE — NC FL2 (Signed)
[place] LEVEL OF CARE SCREENING TOOL     IDENTIFICATION  Patient Name: Kim Hardy Birthdate: 07-21-1931 Sex: female Admission Date (Current Location): 09/16/2017  [facility] and [place] Number:  Herbalist and Address:  [facility],  [place] [address] [place]      Provider Number: [id number]  Attending Physician Name and Address:  Florencia Reasons, MD  Relative Name and Phone Number:       Current Level of Care: Hospital Recommended Level of Care: [place] Prior Approval Number:    Date Approved/Denied:   PASRR Number:   [id number]  Discharge Plan: SNF    Current Diagnoses: Patient Active Problem List   Diagnosis Date Noted  . HCAP (healthcare-associated pneumonia) 09/16/2017  . Protein-calorie malnutrition, severe 07/04/2017  . Nausea 07/02/2017  . Osteoarthritis of right hip 07/02/2017  . UTI (urinary tract infection) 07/01/2017  . Elevated blood pressure reading 04/14/2017  . PVD (peripheral vascular disease) ([place]) 09/20/2016  . Bilateral hand numbness 09/11/2016  . Right leg pain 08/12/2016  . Hypertrophic toenail 08/12/2016  . Anorexia 07/10/2016  . Loss of weight 07/10/2016  . Depression 07/10/2016  . Hypertriglyceridemia 03/08/2016  . Colitis 03/08/2016  . laryngeal ca   . Lung cancer ([place])     Orientation RESPIRATION BLADDER Height & Weight     Self, Place, Situation  Tracheostomy Continent Weight: 83 lb (37.6 kg) Height:  5' (152.4 cm)  BEHAVIORAL SYMPTOMS/MOOD NEUROLOGICAL BOWEL NUTRITION STATUS      Continent Diet  AMBULATORY STATUS COMMUNICATION OF NEEDS Skin   Extensive Assist Verbally Normal                       Personal Care Assistance Level of Assistance  Bathing, Feeding, Dressing Bathing Assistance: Limited assistance Feeding assistance: Independent Dressing Assistance: Limited assistance     Functional Limitations Info  Sight, Hearing, Speech Sight Info:  Adequate Hearing Info: Adequate Speech Info: Impaired (has old trach (stoma)- whispers))    SPECIAL CARE FACTORS FREQUENCY  PT (By licensed PT), OT (By licensed OT)     PT Frequency: 5x/week OT Frequency: 5x/week            Contractures Contractures Info: Not present    Additional Factors Info  Code Status, Allergies Code Status Info: Fullcode Allergies Info: Anesthetics, Amide, Boniva Ibandronate Sodium, Ciprofloxacin, Levofloxacin, Metronidazole, Statins, Sulfa Antibiotics           Current Medications (09/19/2017):  This is the current hospital active medication list Current Facility-Administered Medications  Medication Dose Route Frequency Provider Last Rate Last Dose  . 0.9 % NaCl with KCl 20 mEq/ L  infusion   Intravenous Continuous Florencia Reasons, MD 75 mL/hr at 09/18/17 0154    . albuterol (PROVENTIL) (2.5 MG/3ML) 0.083% nebulizer solution 2.5 mg  2.5 mg Nebulization Q6H PRN Emokpae, Ejiroghene E, MD      . aspirin EC tablet 81 mg  81 mg Oral Daily Emokpae, Ejiroghene E, MD   81 mg at 09/19/17 1000  . clonazepam (KLONOPIN) disintegrating tablet 0.125 mg  0.125 mg Oral BID PRN Florencia Reasons, MD   0.125 mg at 09/18/17 1118  . dorzolamide-timolol (COSOPT) 22.3-6.8 MG/ML ophthalmic solution 1 drop  1 drop Right Eye BID Emokpae, Ejiroghene E, MD   1 drop at 09/18/17 2155  . enoxaparin (LOVENOX) injection 30 mg  30 mg Subcutaneous Q24H Emokpae, Ejiroghene E, MD   30 mg at 09/19/17 0839  .  famotidine (PEPCID) tablet 20 mg  20 mg Oral Daily Emokpae, Ejiroghene E, MD   20 mg at 09/19/17 1030  . guaiFENesin (ROBITUSSIN) 100 MG/5ML solution 100 mg  5 mL Oral Q6H Emokpae, Ejiroghene E, MD   100 mg at 09/19/17 1000  . HYDROcodone-acetaminophen (NORCO/VICODIN) 5-325 MG per tablet 1 tablet  1 tablet Oral Q4H PRN Schorr, Rhetta Mura, NP   1 tablet at 09/19/17 0858  . lactose free nutrition (BOOST PLUS) liquid 237 mL  237 mL Oral TID WC Florencia Reasons, MD   237 mL at 09/19/17 0800  . lip balm (CARMEX)  ointment   Topical PRN Emokpae, Ejiroghene E, MD      . ondansetron (ZOFRAN) injection 4 mg  4 mg Intravenous Q6H PRN Florencia Reasons, MD   4 mg at 09/19/17 1033  . pantoprazole (PROTONIX) EC tablet 40 mg  40 mg Oral Daily Emokpae, Ejiroghene E, MD   40 mg at 09/19/17 1030  . piperacillin-tazobactam (ZOSYN) IVPB 3.375 g  3.375 g Intravenous Q8H Dara Hoyer, [facility]   Stopped at 09/19/17 1036  . polyethylene glycol (MIRALAX / GLYCOLAX) packet 17 g  17 g Oral Daily Florencia Reasons, MD   17 g at 09/19/17 1030  . promethazine (PHENERGAN) tablet 12.5 mg  12.5 mg Oral Q8H PRN Florencia Reasons, MD   12.5 mg at 09/17/17 1330  . senna (SENOKOT) tablet 8.6 mg  1 tablet Oral BID Florencia Reasons, MD   8.6 mg at 09/19/17 1030  . sertraline (ZOLOFT) tablet 25 mg  25 mg Oral Daily Emokpae, Ejiroghene E, MD   25 mg at 09/19/17 1030  . sodium chloride HYPERTONIC 3 % nebulizer solution 4 mL  4 mL Nebulization BID Emokpae, Ejiroghene E, MD   4 mL at 09/19/17 0759  . tamsulosin (FLOMAX) capsule 0.4 mg  0.4 mg Oral Daily Florencia Reasons, MD   0.4 mg at 09/19/17 1030     Discharge Medications: Please see discharge summary for a list of discharge medications.  Relevant Imaging Results:  Relevant Lab Results:   Additional Information SSN:  [place] [id number]  Lia Hopping, [place]

## 2017-09-19 NOTE — Progress Notes (Signed)
Per niece Santiago Glad medical poa, patient should be a DNR 07-07-2017 DNR done here in hospital when patient was admitted this past July. No intubation No CPR TRH Dr. Erlinda Hong  notified

## 2017-09-19 NOTE — Progress Notes (Addendum)
PROGRESS NOTE  Kim Hardy XTG:[id number] DOB: Feb 12, 1931 DOA: 09/16/2017 PCP: [name], MD  HPI/Recap of past 24 hours:   She seems in respiratory distress this am with wheezing, she dose not look comfortable, no fever, no edema she received nebs and deep suction through stoma by respiratory  Now she has a foley due to  urinary retention that required in and out foleyx2 since admission she denies pain this am, no n/v since admitted to the hospital   Assessment/Plan: Principal Problem:   HCAP (healthcare-associated pneumonia) Active Problems:   laryngeal ca   Lung cancer ([place])   Protein-calorie malnutrition, severe   Lobar Pneumonia/acute hypoxia respiratory failure, likely from mucus plugging,  -She presented with sob, tachypnea, sinus tachycardia, increase sputum production through chronic tracheostomy  -CTA on admission no PE, but with possible pneumonia -pneumonia possible due to postobstructive+/- Aspiration (with chronic n/v) -She received deep suction through stoma ( with thick mucus suctioned out), she could not  Tolerate chest PT due to pain -she received rocephin/zithro in the ED, abx changed to vanc/zosyn on admission, blood culture no growth, sputum culture in process, d/c vanc on 9/20, continue zosyn for now  Respiratory distress on 9/21 am, stat cxr ? Pulmonary edema? Though she does not look volume overload on exam, no edema, she has poor oral intake, but she also received fair amount of fluids since admission Will d/c ivf, trial of iv lasix  Swallow eval , case discussed with speech therapist , apparently patient has been having issues with her transesophageal puncture prosthesis for a while, this might leads to recurrent aspiration pneumonia. Case discussed with ENT Dr [name] who has known this patient for the last 6yrs, Dr [name] plan to have the device changes early next week.   6:20pm addendum: patient reassessed this pm, improving, good urine  output  Hyponatremia:  Likely combination of dehydration and SIADH in the setting on pneumonia Sodium 127 on admission, Sodium improving, d/c ivf due to concerns of fluids overload  Hypomagnesemia: replaced.  N/V: report chronic but progressively get worse None observed in the hospital, she dose has very poor oral intake She is on prn antiemetics   Urine retention of 700cc with lower abdominal discomfort on admission , required in and out foley Continue monitor Required another in and out last night  Treat constipation, start trial of flomax, foley placed, will need voiding trial once clinically stable  Lung mass hx of lung cancer, appears to be increasing in size compared to CT scan 2016  oncology Dr [name] consulted, input appreciated Per Dr [name]" "Would not recommend any biopsies for tissue diagnosis at this time . -Patient has very poor performance status and would not be a candidate for palliative chemotherapy even if there were recurrent lung cancer . -Would recommend defining detailed goals of care with the patient's healthcare proxy and family . -She has a lot of competing health priorities which would need to be taken into account to determine need for additional imaging and considerations for biopsy or treatment . -If the patient's goals of care to dictate follow-up for radiographic monitoring of lung cancer would recommend she follow-up with Dr. [name] in 8-12 weeks after treatment of recurrent pneumonia with consideration of repeat CT of the chest or if more data is needed at that point consideration of PET/CT scan "   H/o laryngeal cancer, detail unknown ENT Dr [name] consulted.   Weight loss , Severe malnutrition in context of chronic illness Body  mass index is 16.21 kg/m. Nutrition input appreciated  FTT: PT eval, may need snf placement , patient is open to snf placement if needed  Patient HPOA Kim Hardy requested palliative care consulted     Code Status: full (  per Kim Hardy patient has established DNR, awaiting paper work approval) for now full  Family Communication: patient   Disposition Plan: not ready for discharge, may need snf placement   Consultants:  Oncology  ENT  Palliative care  Speech therapy  Physical therapy  Social worker  Procedures:  none  Antibiotics:  Rocephin/zithro x1 in the ED  Vanc from admission to 9/20  zosyn from admission   Objective: BP 108/62 (BP Location: Right Arm)   Pulse 78   Temp 98 F (36.7 C) (Oral)   Resp 20   Ht 5' (1.524 m)   Wt 37.6 kg (83 lb)   SpO2 93%   BMI 16.21 kg/m   Intake/Output Summary (Last 24 hours) at 09/19/17 1143 Last data filed at 09/19/17 0558  Gross per 24 hour  Intake              835 ml  Output              400 ml  Net              435 ml   Filed Weights   09/16/17 1338  Weight: 37.6 kg (83 lb)    Exam: Patient is examined daily including today on 09/19/2017, exams remain the same as of yesterday except that has changed    General:  Thin, frail, s/p tracheostomy , in respiratory distress  Cardiovascular: RRR  Respiratory: diffuse wheezing bilaterally which is new compare to yesterday am  Abdomen: Soft/ND/NT, positive BS  Musculoskeletal: No Edema  Neuro: alert, oriented   Data Reviewed: Basic Metabolic Panel:  Recent Labs Lab 09/16/17 1442 09/17/17 0602 09/18/17 0625 09/19/17 0549  NA 127* 131* 134* 132*  K 4.8 3.6 4.0 4.0  CL 94* 100* 101 104  CO2 20* 20* 16* 16*  GLUCOSE 87 76 94 83  BUN 12 9 12  21*  CREATININE 0.62 0.57 0.55 0.67  CALCIUM 8.7* 8.1* 8.6* 8.0*  MG  --   --  1.3* 2.2   Liver Function Tests:  Recent Labs Lab 09/18/17 0625  AST 35  ALT 19  ALKPHOS 63  BILITOT 1.7*  PROT 6.8  ALBUMIN 3.1*   No results for input(s): LIPASE, AMYLASE in the last 168 hours. No results for input(s): AMMONIA in the last 168 hours. CBC:  Recent Labs Lab 09/16/17 1442 09/17/17 0602  WBC 10.5 9.2  HGB 13.2 12.2  HCT  38.7 36.5  MCV 91.9 93.1  PLT 321 307   Cardiac Enzymes:    Recent Labs Lab 09/16/17 1442  TROPONINI <0.03   BNP (last 3 results)  Recent Labs  09/16/17 1442  BNP 54.6    ProBNP (last 3 results) No results for input(s): PROBNP in the last 8760 hours.  CBG:  Recent Labs Lab 09/16/17 1444  GLUCAP 82    Recent Results (from the past 240 hour(s))  Culture, Urine     Status: Abnormal   Collection Time: 09/16/17  2:23 PM  Result Value Ref Range Status   Specimen Description URINE, RANDOM  Final   Special Requests NONE  Final   Culture MULTIPLE SPECIES PRESENT, SUGGEST RECOLLECTION (A)  Final   Report Status 09/18/2017 FINAL  Final  Culture, blood (Routine X 2) w  Reflex to ID Panel     Status: None (Preliminary result)   Collection Time: 09/16/17  4:06 PM  Result Value Ref Range Status   Specimen Description BLOOD RIGHT FOREARM  Final   Special Requests   Final    BOTTLES DRAWN AEROBIC AND ANAEROBIC Blood Culture adequate volume   Culture   Final    NO GROWTH 2 DAYS Performed at [facility], [address], [place], [place] [address]    Report Status PENDING  Incomplete  Culture, blood (Routine X 2) w Reflex to ID Panel     Status: None (Preliminary result)   Collection Time: 09/16/17  4:18 PM  Result Value Ref Range Status   Specimen Description BLOOD LEFT ANTECUBITAL  Final   Special Requests   Final    BOTTLES DRAWN AEROBIC AND ANAEROBIC Blood Culture adequate volume   Culture   Final    NO GROWTH 2 DAYS Performed at [facility], [place] [address], [place], [place] [address]    Report Status PENDING  Incomplete  MRSA PCR Screening     Status: None   Collection Time: 09/17/17  5:25 AM  Result Value Ref Range Status   MRSA by PCR NEGATIVE NEGATIVE Final    Comment:        The GeneXpert MRSA Assay (FDA approved for NASAL specimens only), is one component of a comprehensive MRSA colonization surveillance program. It is not intended to diagnose  MRSA infection nor to guide or monitor treatment for MRSA infections.   Culture, expectorated sputum-assessment     Status: None   Collection Time: 09/17/17  9:07 AM  Result Value Ref Range Status   Specimen Description SPUTUM  Final   Special Requests NONE  Final   Sputum evaluation THIS SPECIMEN IS ACCEPTABLE FOR SPUTUM CULTURE  Final   Report Status 09/17/2017 FINAL  Final  Culture, respiratory (NON-Expectorated)     Status: None (Preliminary result)   Collection Time: 09/17/17  9:07 AM  Result Value Ref Range Status   Specimen Description SPUTUM  Final   Special Requests NONE Reflexed from [id number]  Final   Gram Stain   Final    DEGENERATED CELLULAR MATERIAL PRESENT NO WBC IDENTIFIED NO ORGANISMS SEEN    Culture   Final    NO GROWTH < 24 HOURS Performed at [facility], [address] [place], [place] [address]    Report Status PENDING  Incomplete     Studies: Dg Chest Port 1 View  Result Date: 09/19/2017 CLINICAL DATA:  Shortness of breath EXAM: PORTABLE CHEST 1 VIEW COMPARISON:  September 16, 2017 FINDINGS: A right lower lobe masslike opacities again identified consistent with the patient's known malignancy. There is diffuse interstitial opacity today, new in the interval. The heart, hila, and mediastinum are unchanged. No pneumothorax. IMPRESSION: 1. New diffuse interstitial opacities suggest edema or atypical infection. 2. The patient's known mass in the medial right lower lobe is again identified, partially obscured by the diffuse interstitial opacities. Electronically Signed   By: Dorise Bullion III M.D   On: 09/19/2017 10:07    Scheduled Meds: . aspirin EC  81 mg Oral Daily  . dorzolamide-timolol  1 drop Right Eye BID  . enoxaparin (LOVENOX) injection  30 mg Subcutaneous Q24H  . famotidine  20 mg Oral Daily  . furosemide  40 mg Intravenous Once  . guaiFENesin  5 mL Oral Q6H  . lactose free nutrition  237 mL Oral TID  WC  . pantoprazole  40 mg Oral Daily  .  polyethylene glycol  17 g Oral Daily  . senna  1 tablet Oral BID  . sertraline  25 mg Oral Daily  . sodium chloride HYPERTONIC  4 mL Nebulization BID  . tamsulosin  0.4 mg Oral Daily    Continuous Infusions: . piperacillin-tazobactam (ZOSYN)  IV Stopped (09/19/17 1036)     Time spent: 6mins I have personally reviewed and interpreted on  09/19/2017 daily labs, tele strips, imagings as discussed above under data review session and assessment and plans.  I reviewed all nursing notes, pharmacy notes,  vitals, pertinent old records  I have discussed plan of care as described above with RN , patient  on 09/19/2017   Climmie Buelow MD, PhD  [facility] Pager [phone]. If 7PM-7AM, please contact night-coverage at [email or web address], password [facility] 09/19/2017, 11:43 AM  LOS: 3 days

## 2017-09-19 NOTE — Care Management Important Message (Signed)
Important Message  Patient Details  Name: Kim Hardy MRN: [id number] Date of Birth: 1931-12-02   Medicare Important Message Given:  Yes    Kerin Salen 09/19/2017, 1:03 [place] Message  Patient Details  Name: Kim Hardy MRN: [id number] Date of Birth: 06-01-1931   Medicare Important Message Given:  Yes    Kerin Salen 09/19/2017, 1:02 PM

## 2017-09-20 DIAGNOSIS — Z7189 Other specified counseling: Secondary | ICD-10-CM

## 2017-09-20 DIAGNOSIS — R531 Weakness: Secondary | ICD-10-CM

## 2017-09-20 DIAGNOSIS — Z515 Encounter for palliative care: Secondary | ICD-10-CM

## 2017-09-20 LAB — BASIC METABOLIC PANEL
Anion gap: 10 (ref 5–15)
BUN: 21 mg/dL — AB (ref 6–20)
CHLORIDE: 101 mmol/L (ref 101–111)
CO2: 24 mmol/L (ref 22–32)
CREATININE: 0.56 mg/dL (ref 0.44–1.00)
Calcium: 8.1 mg/dL — ABNORMAL LOW (ref 8.9–10.3)
GFR calc Af Amer: >60 mL/min (ref >60)
GFR calc non Af Amer: >60 mL/min (ref >60)
Glucose, Bld: 108 mg/dL — ABNORMAL HIGH (ref 65–99)
Potassium: 3.3 mmol/L — ABNORMAL LOW (ref 3.5–5.1)
SODIUM: 135 mmol/L (ref 135–145)

## 2017-09-20 LAB — CBC WITH DIFFERENTIAL/PLATELET
Basophils Absolute: 0 10*3/uL (ref 0.0–0.1)
Basophils Relative: 0 %
EOS ABS: 0.2 10*3/uL (ref 0.0–0.7)
Eosinophils Relative: 1 %
HEMATOCRIT: 35 % — AB (ref 36.0–46.0)
HEMOGLOBIN: 12 g/dL (ref 12.0–15.0)
LYMPHS ABS: 0.6 10*3/uL — AB (ref 0.7–4.0)
Lymphocytes Relative: 4 %
MCH: 31.6 pg (ref 26.0–34.0)
MCHC: 34.3 g/dL (ref 30.0–36.0)
MCV: 92.1 fL (ref 78.0–100.0)
MONO ABS: 1.6 10*3/uL — AB (ref 0.1–1.0)
MONOS PCT: 11 %
NEUTROS PCT: 84 %
Neutro Abs: 12.9 10*3/uL — ABNORMAL HIGH (ref 1.7–7.7)
Platelets: 322 10*3/uL (ref 150–400)
RBC: 3.8 MIL/uL — ABNORMAL LOW (ref 3.87–5.11)
RDW: 13 % (ref 11.5–15.5)
WBC: 15.3 10*3/uL — ABNORMAL HIGH (ref 4.0–10.5)

## 2017-09-20 LAB — LACTIC ACID, PLASMA: Lactic Acid, Venous: 1 mmol/L (ref 0.5–1.9)

## 2017-09-20 LAB — EXPECTORATED SPUTUM ASSESSMENT W REFEX TO RESP CULTURE

## 2017-09-20 LAB — MAGNESIUM: Magnesium: 1.7 mg/dL (ref 1.7–2.4)

## 2017-09-20 LAB — EXPECTORATED SPUTUM ASSESSMENT W GRAM STAIN, RFLX TO RESP C

## 2017-09-20 MED ORDER — OXYCODONE HCL 5 MG PO TABS
5.0000 mg | ORAL_TABLET | ORAL | Status: DC | PRN
  Administered 2017-09-20 – 2017-09-24 (×7): 5 mg via ORAL
  Filled 2017-09-20 (×8): qty 1

## 2017-09-20 MED ORDER — SODIUM CHLORIDE 0.9 % IV SOLN
1.5000 g | Freq: Three times a day (TID) | INTRAVENOUS | Status: DC
  Administered 2017-09-21 (×2): 1.5 g via INTRAVENOUS
  Filled 2017-09-20 (×3): qty 1.5

## 2017-09-20 MED ORDER — METHYLPREDNISOLONE SODIUM SUCC 125 MG IJ SOLR
60.0000 mg | Freq: Four times a day (QID) | INTRAMUSCULAR | Status: DC
  Administered 2017-09-20 – 2017-09-22 (×9): 60 mg via INTRAVENOUS
  Filled 2017-09-20 (×9): qty 2

## 2017-09-20 MED ORDER — FUROSEMIDE 10 MG/ML IJ SOLN
40.0000 mg | Freq: Once | INTRAMUSCULAR | Status: AC
Start: 2017-09-20 — End: 2017-09-20
  Administered 2017-09-20: 40 mg via INTRAVENOUS
  Filled 2017-09-20: qty 4

## 2017-09-20 MED ORDER — MORPHINE SULFATE (PF) 4 MG/ML IV SOLN
1.0000 mg | INTRAVENOUS | Status: DC | PRN
  Administered 2017-09-20 – 2017-09-25 (×3): 1 mg via INTRAVENOUS
  Filled 2017-09-20 (×3): qty 1

## 2017-09-20 MED ORDER — IPRATROPIUM-ALBUTEROL 0.5-2.5 (3) MG/3ML IN SOLN
3.0000 mL | Freq: Four times a day (QID) | RESPIRATORY_TRACT | Status: DC
  Administered 2017-09-20 – 2017-09-22 (×9): 3 mL via RESPIRATORY_TRACT
  Filled 2017-09-20 (×10): qty 3

## 2017-09-20 MED ORDER — POTASSIUM CHLORIDE 20 MEQ/15ML (10%) PO SOLN
40.0000 meq | Freq: Once | ORAL | Status: AC
  Administered 2017-09-20: 40 meq via ORAL
  Filled 2017-09-20: qty 30

## 2017-09-20 NOTE — Progress Notes (Signed)
Pharmacy Antibiotic Note  Kim Hardy is a 83 y.o. female admitted on 09/16/2017 with pneumonia.  PMH significant for lung cancer, no recent chemotherapy.  Pharmacy consulted for zosyn dosing, vancomycin has been d/c.  09/20/2017: Today is day #5 broad spectrum antibiotics Afebrile WBC remains elevated (solumedrol) SCr stable, CrCl ~ 31 ml/min  Plan:  Zosyn 3.375g IV Q8H infused over 4hrs.  Follow up renal fxn, culture results, and clinical course.  Follow up narrowing antibiotics, intended duration?   Height: 5' (152.4 cm) Weight: 83 lb (37.6 kg) IBW/kg (Calculated) : 45.5  Temp (24hrs), Avg:98.2 F (36.8 C), Min:98.1 F (36.7 C), Max:98.4 F (36.9 C)   Recent Labs Lab 09/16/17 1442 09/16/17 1613 09/17/17 0602 09/18/17 0625 09/19/17 0549 09/20/17 0609  WBC 10.5  --  9.2  --   --  15.3*  CREATININE 0.62  --  0.57 0.55 0.67 0.56  LATICACIDVEN  --  1.38  --   --   --  1.0    Estimated Creatinine Clearance: 31.1 mL/min (by C-G formula based on SCr of 0.56 mg/dL).    Allergies  Allergen Reactions  . Anesthetics, Amide Nausea And Vomiting  . Boniva [Ibandronate Sodium] Other (See Comments)    tremors  . Ciprofloxacin     "I get very sick"  . Levofloxacin     Pt states she is unable to take this.   . Metronidazole     Pt states she is unable to tolerate.   . Statins Nausea And Vomiting  . Sulfa Antibiotics Hives    Antimicrobials this admission: 9/18 CTX x 1 9/18 Azith x1 9/18 Vancomycin >> 9/20 9/18 Zosyn >>  Dose adjustments this admission:  Microbiology results: 9/18BCx: ngtd 9/18 UCx: mx spp - recollect? 9/19 sput: NGF 9/19 MRSA PCR: neg 9/22 Sputum: sent  Thank you for allowing pharmacy to be a part of this patient's care.  Gretta Arab PharmD, BCPS Pager [phone] 09/20/2017 2:00 PM

## 2017-09-20 NOTE — Progress Notes (Signed)
PROGRESS NOTE  Kim Hardy QAS:[id number] DOB: [birth year] DOA: 09/16/2017 PCP: [name], MD  HPI/Recap of past 24 hours:   She continue has wheezing,  no fever, no edema she reports leg pain this am, no n/v since admitted to the hospital   Assessment/Plan: Principal Problem:   HCAP (healthcare-associated pneumonia) Active Problems:   laryngeal ca   Lung cancer ([place])   Protein-calorie malnutrition, severe   Lobar Pneumonia/acute hypoxia respiratory failure, likely from mucus plugging, aspiration pneumonia -She presented with sob, tachypnea, sinus tachycardia, increase sputum production through chronic tracheostomy  -CTA on admission no PE, but with possible pneumonia -pneumonia possible due to postobstructive+/- Aspiration (with chronic n/v) -She received deep suction through stoma ( with thick mucus suctioned out), she could not  Tolerate chest PT due to pain -she received rocephin/zithro in the ED, abx changed to vanc/zosyn on admission, blood culture no growth, sputum culture in process, d/c vanc on 9/20,  zosyn changed to unasyn on 9/22 to cover possible aspiration pneumonia -she remain wheezing, will start on steroids, schedule nebs, additional dose of lasix given on 9/22,    Swallow eval , case discussed with speech therapist , apparently patient has been having issues with her transesophageal puncture prosthesis for a while, this might leads to recurrent aspiration pneumonia. Case discussed with ENT Dr [name] who has known this patient for the last 24yrs, Dr [name] plan to have the device changes early next week.   Hyponatremia:  Likely combination of dehydration and SIADH in the setting on pneumonia Sodium 127 on admission, Sodium improved with ivf,  ivf d/ced on 9/21due to concerns of fluids overload  Hypomagnesemia: replaced.  N/V: report chronic but progressively get worse None observed in the hospital, she dose has very poor oral intake She is on prn  antiemetics   Urine retention of 700cc with lower abdominal discomfort on admission , required in and out foley Continue monitor Required another in and out last night  Treat constipation, start trial of flomax, foley placed, will need voiding trial once clinically stable  Lung mass hx of lung cancer, appears to be increasing in size compared to CT scan 2016  oncology Dr [name] consulted, input appreciated Per Dr [name]" "Would not recommend any biopsies for tissue diagnosis at this time . -Patient has very poor performance status and would not be a candidate for palliative chemotherapy even if there were recurrent lung cancer . -Would recommend defining detailed goals of care with the patient's healthcare proxy and family . -She has a lot of competing health priorities which would need to be taken into account to determine need for additional imaging and considerations for biopsy or treatment . -If the patient's goals of care to dictate follow-up for radiographic monitoring of lung cancer would recommend she follow-up with Dr. [name] in 8-12 weeks after treatment of recurrent pneumonia with consideration of repeat CT of the chest or if more data is needed at that point consideration of PET/CT scan "   H/o laryngeal cancer, detail unknown ENT Dr [name] consulted.   Weight loss , Severe malnutrition in context of chronic illness Body mass index is 16.21 kg/m. Nutrition input appreciated  FTT: PT eval, may need snf placement , patient is open to snf placement if needed  Patient HPOA Santiago Glad requested palliative care consulted     Code Status: DNR   Family Communication: patient   Disposition Plan: not ready for discharge, may need snf placement with palliative care, hospice?  Consultants:  Oncology  ENT  Palliative care  Speech therapy  Physical therapy  Social worker  Procedures:  none  Antibiotics:  Rocephin/zithro x1 in the ED  Vanc from admission to  9/20  zosyn from admission to 9/22  unasyn from 9/22   Objective: BP 102/87 (BP Location: Left Arm)   Pulse 98   Temp 98.4 F (36.9 C) (Oral)   Resp 18   Ht 5' (1.524 m)   Wt 37.6 kg (83 lb)   SpO2 99%   BMI 16.21 kg/m   Intake/Output Summary (Last 24 hours) at 09/20/17 0037 Last data filed at 09/19/17 1800  Gross per 24 hour  Intake                0 ml  Output              750 ml  Net             -750 ml   Filed Weights   09/16/17 1338  Weight: 37.6 kg (83 lb)    Exam: Patient is examined daily including today on 09/20/2017, exams remain the same as of yesterday except that has changed    General:  Thin, frail, s/p tracheostomy , remain wheezing, aphonia (device malfunction)  Cardiovascular: RRR  Respiratory: persistent diffuse wheezing bilaterally  Abdomen: Soft/ND/NT, positive BS  Musculoskeletal: No Edema  Neuro: alert, oriented   Data Reviewed: Basic Metabolic Panel:  Recent Labs Lab 09/16/17 1442 09/17/17 0602 09/18/17 0625 09/19/17 0549 09/20/17 0609  NA 127* 131* 134* 132* 135  K 4.8 3.6 4.0 4.0 3.3*  CL 94* 100* 101 104 101  CO2 20* 20* 16* 16* 24  GLUCOSE 87 76 94 83 108*  BUN 12 9 12  21* 21*  CREATININE 0.62 0.57 0.55 0.67 0.56  CALCIUM 8.7* 8.1* 8.6* 8.0* 8.1*  MG  --   --  1.3* 2.2 1.7   Liver Function Tests:  Recent Labs Lab 09/18/17 0625  AST 35  ALT 19  ALKPHOS 63  BILITOT 1.7*  PROT 6.8  ALBUMIN 3.1*   No results for input(s): LIPASE, AMYLASE in the last 168 hours. No results for input(s): AMMONIA in the last 168 hours. CBC:  Recent Labs Lab 09/16/17 1442 09/17/17 0602 09/20/17 0609  WBC 10.5 9.2 15.3*  NEUTROABS  --   --  12.9*  HGB 13.2 12.2 12.0  HCT 38.7 36.5 35.0*  MCV 91.9 93.1 92.1  PLT 321 307 322   Cardiac Enzymes:    Recent Labs Lab 09/16/17 1442  TROPONINI <0.03   BNP (last 3 results)  Recent Labs  09/16/17 1442  BNP 54.6    ProBNP (last 3 results) No results for input(s): PROBNP  in the last 8760 hours.  CBG:  Recent Labs Lab 09/16/17 1444  GLUCAP 82    Recent Results (from the past 240 hour(s))  Culture, Urine     Status: Abnormal   Collection Time: 09/16/17  2:23 PM  Result Value Ref Range Status   Specimen Description URINE, RANDOM  Final   Special Requests NONE  Final   Culture MULTIPLE SPECIES PRESENT, SUGGEST RECOLLECTION (A)  Final   Report Status 09/18/2017 FINAL  Final  Culture, blood (Routine X 2) w Reflex to ID Panel     Status: None (Preliminary result)   Collection Time: 09/16/17  4:06 PM  Result Value Ref Range Status   Specimen Description BLOOD RIGHT FOREARM  Final   Special Requests   Final  BOTTLES DRAWN AEROBIC AND ANAEROBIC Blood Culture adequate volume   Culture   Final    NO GROWTH 3 DAYS Performed at [facility], [place] [address] [place], [place] [address]    Report Status PENDING  Incomplete  Culture, blood (Routine X 2) w Reflex to ID Panel     Status: None (Preliminary result)   Collection Time: 09/16/17  4:18 PM  Result Value Ref Range Status   Specimen Description BLOOD LEFT ANTECUBITAL  Final   Special Requests   Final    BOTTLES DRAWN AEROBIC AND ANAEROBIC Blood Culture adequate volume   Culture   Final    NO GROWTH 3 DAYS Performed at [facility], [place] [address] [place], [place] [address]    Report Status PENDING  Incomplete  MRSA PCR Screening     Status: None   Collection Time: 09/17/17  5:25 AM  Result Value Ref Range Status   MRSA by PCR NEGATIVE NEGATIVE Final    Comment:        The GeneXpert MRSA Assay (FDA approved for NASAL specimens only), is one component of a comprehensive MRSA colonization surveillance program. It is not intended to diagnose MRSA infection nor to guide or monitor treatment for MRSA infections.   Culture, expectorated sputum-assessment     Status: None   Collection Time: 09/17/17  9:07 AM  Result Value Ref Range Status   Specimen Description SPUTUM  Final    Special Requests NONE  Final   Sputum evaluation THIS SPECIMEN IS ACCEPTABLE FOR SPUTUM CULTURE  Final   Report Status 09/17/2017 FINAL  Final  Culture, respiratory (NON-Expectorated)     Status: None   Collection Time: 09/17/17  9:07 AM  Result Value Ref Range Status   Specimen Description SPUTUM  Final   Special Requests NONE Reflexed from [id number]  Final   Gram Stain   Final    DEGENERATED CELLULAR MATERIAL PRESENT NO WBC IDENTIFIED NO ORGANISMS SEEN    Culture   Final    NO GROWTH 2 DAYS Performed at [facility], [address], [place],  [address]    Report Status 09/19/2017 FINAL  Final     Studies: Dg Chest Port 1 View  Result Date: 09/19/2017 CLINICAL DATA:  Shortness of breath EXAM: PORTABLE CHEST 1 VIEW COMPARISON:  September 16, 2017 FINDINGS: A right lower lobe masslike opacities again identified consistent with the patient's known malignancy. There is diffuse interstitial opacity today, new in the interval. The heart, hila, and mediastinum are unchanged. No pneumothorax. IMPRESSION: 1. New diffuse interstitial opacities suggest edema or atypical infection. 2. The patient's known mass in the medial right lower lobe is again identified, partially obscured by the diffuse interstitial opacities. Electronically Signed   By: Dorise Bullion III M.D   On: 09/19/2017 10:07    Scheduled Meds: . aspirin EC  81 mg Oral Daily  . dorzolamide-timolol  1 drop Right Eye BID  . enoxaparin (LOVENOX) injection  30 mg Subcutaneous Q24H  . famotidine  20 mg Oral Daily  . guaiFENesin  5 mL Oral Q6H  . lactose free nutrition  237 mL Oral TID WC  . pantoprazole  40 mg Oral Daily  . polyethylene glycol  17 g Oral Daily  . potassium chloride  40 mEq Oral Once  . senna  1 tablet Oral BID  . sertraline  25 mg Oral Daily  . tamsulosin  0.4 mg Oral Daily    Continuous  Infusions: . piperacillin-tazobactam (ZOSYN)  IV Stopped (09/20/17 3403)     Time spent: 9mins I have  personally reviewed and interpreted on  09/20/2017 daily labs, tele strips, imagings as discussed above under data review session and assessment and plans.  I reviewed all nursing notes, pharmacy notes,  vitals, pertinent old records  I have discussed plan of care as described above with RN , patient  on 09/20/2017   Rapheal Masso MD, PhD  [facility] Pager [phone]. If 7PM-7AM, please contact night-coverage at [email or web address], password [facility] 09/20/2017, 8:28 AM  LOS: 4 days

## 2017-09-20 NOTE — Consult Note (Signed)
Consultation Note Date: 09/20/2017   Patient Name: Kim Hardy  DOB: 07-22-1931  MRN: [id number]  Age / Sex: 83 y.o., female  PCP: [name], MD Referring Physician: Florencia Reasons, MD  Reason for Consultation: Establishing goals of care  HPI/Patient Profile: 83 y.o. female  with past medical history of ongoing gradual progressive decline for past few months, history of laryngeal cancer, newly found lung mass admitted on 09/16/2017 with PNA.   Clinical Assessment and Goals of Care:  83 yo lady with trach, previous h/o lung cancer and laryngeal cancer.  He has a history of non-small cell lung cancer status post SBRT April 2012 Right lower lobe lung. She follows with Dr. [name] for radiation oncology. She also has a history of laryngeal cancer status post surgery in 1997. Patient has a history of peripheral vascular disease dyslipidemia.  She has been admitted to the hospital on 09/16/2017 with no weakness nausea and shortness of breath. She has also had some progressive weight loss over the last 1-2 yrs, ongoing decline since the past 4-6 months.   Current hospitalization is for PNA, possibly chronic aspiration, patient has left lung nodularity, possibly with active lung cancer.   Palliative consult for goals of care.   Patient did not rest well overnight, she complains of generalized pain, call placed and discussed with Renato Battles, see below.   HCPOA  niece   SUMMARY OF RECOMMENDATIONS   DNR DNI re discussed and clarified with patient, niece HCPOA agent Tylene Fantasia [phone].   Continue current mode of care  SNF rehab with palliative care on discharge, patient will soon need hospice service in my opinion. She lives alone, niece Renato Battles agent works full time, she is estranged from her son who reportedly lives in [place], [place]; home with hospice is not a safe option.   Augment pain regimen:  D/C Norco, start PO Oxy IR, start IV Morphine PRN, already on good bowel regimen, continue to monitor.   Recommend repeat SLP eval   Code Status/Advance Care Planning:  DNR    Symptom Management:    as above   Palliative Prophylaxis:   Bowel Regimen  Psycho-social/Spiritual:   Desire for further Chaplaincy support:yes  Additional Recommendations: Education on Hospice  Prognosis:   < 6 months?  Discharge Planning: [place] for rehab with Palliative care service follow-up      Primary Diagnoses: Present on Admission: . Lung cancer ([place]) . laryngeal ca . Protein-calorie malnutrition, severe . HCAP (healthcare-associated pneumonia)   I have reviewed the medical record, interviewed the patient and family, and examined the patient. The following aspects are pertinent.  Past Medical History:  Diagnosis Date  . Allergy    sulfa-hives, boniva=tremors,statins=n/v  . Diverticulosis 04/22/16   ct abd/pelvis  . History of radiation therapy 04/01/2011- 4/11/212   right lung  lower lobe  . Hypercholesterolemia   . laryngeal ca dx'd 1997   surg only  . Lung cancer ([place]) dx'd 01-2011   xrt comp 03/2011  .  Peripheral vascular disease [facility])    Social History   Social History  . Marital status: Divorced    Spouse name: N/A  . Number of children: N/A  . Years of education: N/A   Social History Main Topics  . Smoking status: Former Smoker    Packs/day: 1.00    Years: 20.00    Quit date: 03/30/1996  . Smokeless tobacco: Never Used     Comment: quit w/dx laryngeal cancer 1997  . Alcohol use No  . Drug use: No  . Sexual activity: Not Asked   Other Topics Concern  . None   Social History Narrative  . None   No family history on file. Scheduled Meds: . aspirin EC  81 mg Oral Daily  . dorzolamide-timolol  1 drop Right Eye BID  . enoxaparin (LOVENOX) injection  30 mg Subcutaneous Q24H  . famotidine  20 mg Oral Daily  . guaiFENesin  5 mL Oral Q6H  .  lactose free nutrition  237 mL Oral TID WC  . pantoprazole  40 mg Oral Daily  . polyethylene glycol  17 g Oral Daily  . potassium chloride  40 mEq Oral Once  . senna  1 tablet Oral BID  . sertraline  25 mg Oral Daily  . tamsulosin  0.4 mg Oral Daily   Continuous Infusions: . piperacillin-tazobactam (ZOSYN)  IV Stopped (09/20/17 0657)   PRN Meds:.albuterol, clonazepam, lip balm, morphine injection, ondansetron (ZOFRAN) IV, oxyCODONE, promethazine Medications Prior to Admission:  Prior to Admission medications   Medication Sig Start Date End Date Taking? Authorizing Provider  acetaminophen (TYLENOL) 160 MG/5ML liquid Take 15.6 mLs (500 mg total) by mouth every 4 (four) hours as needed for fever. 08/06/17  Yes Burns, Claudina Lick, MD  aspirin EC 81 MG tablet Take 1 tablet (81 mg total) by mouth daily. 11/10/16  Yes Burns, Claudina Lick, MD  dimenhyDRINATE (DRAMAMINE) 50 MG tablet Take 50 mg by mouth every 8 (eight) hours as needed for nausea.   Yes [provider]  dorzolamide-timolol (COSOPT) 22.3-6.8 MG/ML ophthalmic solution INSTILL 1 DROP INTO RIGHT EYE TWICE A DAY 05/17/16  Yes [provider]  ibuprofen (ADVIL,MOTRIN) 200 MG tablet Take 400 mg by mouth every 6 (six) hours as needed for mild pain or moderate pain.   Yes [provider]  megestrol (MEGACE) 40 MG/ML suspension TAKE 10 ML BY MOUTH DAILY 09/11/17  Yes Biagio Borg, MD  ondansetron (ZOFRAN) 4 MG tablet Take 1 tablet (4 mg total) by mouth every 8 (eight) hours as needed for nausea or vomiting. 08/28/17  Yes Rosemarie Ax, MD  OVER THE COUNTER MEDICATION Place 1 drop into both eyes 2 (two) times daily as needed (dry eyes).    Yes [provider]  promethazine (PHENERGAN) 12.5 MG tablet Take 1 tablet (12.5 mg total) by mouth every 8 (eight) hours as needed for nausea or vomiting. 09/11/17  Yes Biagio Borg, MD  sertraline (ZOLOFT) 25 MG tablet Take 1 tablet (25 mg total) by mouth daily. 08/28/17  Yes  Rosemarie Ax, MD  sodium chloride HYPERTONIC 3 % nebulizer solution Take 4 mLs by nebulization 2 (two) times daily. 07/07/17  Yes Arrien, Jimmy Picket, MD  famotidine (PEPCID) 20 MG tablet Take 1 tablet (20 mg total) by mouth daily. 07/08/17 08/07/17  Arrien, Jimmy Picket, MD  guaiFENesin (ROBITUSSIN) 100 MG/5ML SOLN Take 5 mLs (100 mg total) by mouth every 6 (six) hours. Patient not taking: Reported on 09/16/2017 07/07/17  Arrien, Jimmy Picket, MD  pantoprazole (PROTONIX) 40 MG tablet Take 1 tablet (40 mg total) by mouth daily. Patient not taking: Reported on 09/16/2017 09/11/17   Biagio Borg, MD   Allergies  Allergen Reactions  . Anesthetics, Amide Nausea And Vomiting  . Boniva [Ibandronate Sodium] Other (See Comments)    tremors  . Ciprofloxacin     "I get very sick"  . Levofloxacin     Pt states she is unable to take this.   . Metronidazole     Pt states she is unable to tolerate.   . Statins Nausea And Vomiting  . Sulfa Antibiotics Hives   Review of Systems +pain  Physical Exam Thin cachectic frail Has trach S1 S2 Abdomen soft Coarse breath sounds No edema Muscle wasting Awake alert Has trach  Vital Signs: BP 102/87 (BP Location: Left Arm)   Pulse 98   Temp 98.4 F (36.9 C) (Oral)   Resp 18   Ht 5' (1.524 m)   Wt 37.6 kg (83 lb)   SpO2 99%   BMI 16.21 kg/m  Pain Assessment: 0-10   Pain Score: 0-No pain   SpO2: SpO2: 99 % O2 Device:SpO2: 99 % O2 Flow Rate: .   IO: Intake/output summary:  Intake/Output Summary (Last 24 hours) at 09/20/17 2035 Last data filed at 09/19/17 1800  Gross per 24 hour  Intake                0 ml  Output              750 ml  Net             -750 ml    LBM: Last BM Date: 09/18/17 Baseline Weight: Weight: 37.6 kg (83 lb) Most recent weight: Weight: 37.6 kg (83 lb)     Palliative Assessment/Data:   Flowsheet Rows     Most Recent Value  Intake Tab  Referral Department  Hospitalist  Unit at Time of Referral   Med/Surg Unit  Palliative Care Primary Diagnosis  Cancer  Palliative Care Type  New Palliative care  Reason for referral  Clarify Goals of Care, Pain  Date first seen by Palliative Care  09/20/17  Clinical Assessment  Palliative Performance Scale Score  30%  Pain Max last 24 hours  6  Pain Min Last 24 hours  3  Dyspnea Max Last 24 Hours  5  Dyspnea Min Last 24 hours  4  Nausea Max Last 24 Hours  2  Nausea Min Last 24 Hours  1  Anxiety Max Last 24 Hours  5  Anxiety Min Last 24 Hours  4  Psychosocial & Spiritual Assessment  Palliative Care Outcomes  Patient/Family meeting held?  Yes  Who was at the meeting?  patient, niece Santiago Glad over the phone.   Palliative Care Outcomes  Clarified goals of care, Improved pain interventions      Time In:  11 Time Out:  12.10 Time Total:   70 min  Greater than 50%  of this time was spent counseling and coordinating care related to the above assessment and plan.  Signed by: Loistine Chance, MD  [phone]  Please contact Palliative Medicine Team phone at [phone] for questions and concerns.  For individual provider: See Shea Evans

## 2017-09-21 LAB — CBC WITH DIFFERENTIAL/PLATELET
BASOS ABS: 0 10*3/uL (ref 0.0–0.1)
BASOS PCT: 0 %
EOS ABS: 0 10*3/uL (ref 0.0–0.7)
Eosinophils Relative: 0 %
HCT: 32 % — ABNORMAL LOW (ref 36.0–46.0)
HEMOGLOBIN: 10.9 g/dL — AB (ref 12.0–15.0)
LYMPHS PCT: 2 %
Lymphs Abs: 0.3 10*3/uL — ABNORMAL LOW (ref 0.7–4.0)
MCH: 31.4 pg (ref 26.0–34.0)
MCHC: 34.1 g/dL (ref 30.0–36.0)
MCV: 92.2 fL (ref 78.0–100.0)
MONOS PCT: 5 %
Monocytes Absolute: 0.7 10*3/uL (ref 0.1–1.0)
NEUTROS PCT: 93 %
Neutro Abs: 12.3 10*3/uL — ABNORMAL HIGH (ref 1.7–7.7)
Platelets: 325 10*3/uL (ref 150–400)
RBC: 3.47 MIL/uL — ABNORMAL LOW (ref 3.87–5.11)
RDW: 13.2 % (ref 11.5–15.5)
WBC: 13.3 10*3/uL — ABNORMAL HIGH (ref 4.0–10.5)

## 2017-09-21 LAB — CULTURE, BLOOD (ROUTINE X 2)
Culture: NO GROWTH
Culture: NO GROWTH
Special Requests: ADEQUATE
Special Requests: ADEQUATE

## 2017-09-21 LAB — BASIC METABOLIC PANEL
Anion gap: 14 (ref 5–15)
BUN: 26 mg/dL — ABNORMAL HIGH (ref 6–20)
CHLORIDE: 100 mmol/L — AB (ref 101–111)
CO2: 23 mmol/L (ref 22–32)
Calcium: 8 mg/dL — ABNORMAL LOW (ref 8.9–10.3)
Creatinine, Ser: 0.86 mg/dL (ref 0.44–1.00)
GFR calc non Af Amer: >60 mL/min (ref >60)
Glucose, Bld: 143 mg/dL — ABNORMAL HIGH (ref 65–99)
POTASSIUM: 3.2 mmol/L — AB (ref 3.5–5.1)
SODIUM: 137 mmol/L (ref 135–145)

## 2017-09-21 LAB — MAGNESIUM: Magnesium: 1.5 mg/dL — ABNORMAL LOW (ref 1.7–2.4)

## 2017-09-21 MED ORDER — MAGNESIUM SULFATE 2 GM/50ML IV SOLN
2.0000 g | Freq: Once | INTRAVENOUS | Status: AC
  Administered 2017-09-21: 2 g via INTRAVENOUS
  Filled 2017-09-21: qty 50

## 2017-09-21 MED ORDER — POTASSIUM CHLORIDE 20 MEQ/15ML (10%) PO SOLN
40.0000 meq | Freq: Once | ORAL | Status: AC
  Administered 2017-09-21: 40 meq via ORAL
  Filled 2017-09-21: qty 30

## 2017-09-21 MED ORDER — SODIUM CHLORIDE 0.9 % IV SOLN
3.0000 g | Freq: Two times a day (BID) | INTRAVENOUS | Status: DC
  Administered 2017-09-21 – 2017-09-22 (×2): 3 g via INTRAVENOUS
  Filled 2017-09-21 (×3): qty 3

## 2017-09-21 NOTE — Progress Notes (Signed)
PHARMACY NOTE:  ANTIMICROBIAL RENAL DOSAGE ADJUSTMENT  Current antimicrobial regimen includes a mismatch between antimicrobial dosage and estimated renal function.  As per policy approved by the Pharmacy & Therapeutics and Medical Executive Committees, the antimicrobial dosage will be adjusted accordingly.  Current antimicrobial dosage:  Unasyn 1.5g IV q8h  Indication: Aspiration pneumonia  Renal Function:  Estimated Creatinine Clearance: 28.9 mL/min (by C-G formula based on SCr of 0.86 mg/dL). []      On intermittent HD, scheduled: []      On CRRT    Antimicrobial dosage has been changed to:  Unasyn 3g IV q12h  Thank you for allowing pharmacy to be a part of this patient's care.  Gretta Arab PharmD, BCPS Pager [phone] 09/21/2017 3:28 PM

## 2017-09-21 NOTE — Progress Notes (Signed)
Patient refuses CPT at this time.

## 2017-09-21 NOTE — Social Work (Signed)
CSW went to room to meet with patient and family to discuss bed offers. CSW f/u twice and niece has not arrived.  CSW left bed offers for SNF in room and discussed with RN.  CSW will continue to follow up for SNF placement.  Elissa Hefty, [place] Social Worker/Weekend [phone]

## 2017-09-21 NOTE — Progress Notes (Signed)
PROGRESS NOTE  RISA AUMAN HKV:[id number] DOB: [birth year] DOA: 09/16/2017 PCP: [name], MD  HPI/Recap of past 24 hours:   She is feeling better this am, less wheezing,  She looks comfortable this am no fever, no edema  no n/v since admitted to the hospital   Assessment/Plan: Principal Problem:   HCAP (healthcare-associated pneumonia) Active Problems:   laryngeal ca   Lung cancer ([place])   Protein-calorie malnutrition, severe   Generalized weakness   Encounter for palliative care   Goals of care, counseling/discussion   Lobar Pneumonia/acute hypoxia respiratory failure, likely from mucus plugging, aspiration pneumonia -She presented with sob, tachypnea, sinus tachycardia, increase sputum production through chronic tracheostomy  -CTA on admission no PE, but with possible pneumonia -pneumonia possible due to postobstructive+/- Aspiration (with chronic n/v) -She received deep suction through stoma ( with thick mucus suctioned out), she could not  Tolerate chest PT due to pain -she received rocephin/zithro in the ED, abx changed to vanc/zosyn on admission, blood culture no growth, sputum culture in process, d/c vanc on 9/20,  zosyn changed to unasyn on 9/22 to cover possible aspiration pneumonia -due to wheezing, she is started on steroids, schedule nebs, additional dose of lasix given on 9/22,  -improving, continue steroids, nebs   Swallow eval , case discussed with speech therapist , apparently patient has been having issues with her transesophageal puncture prosthesis for a while, this might leads to recurrent aspiration pneumonia. Case discussed with ENT Dr [name] who has known this patient for the last 81yrs, Dr [name] plan to have the device changes early next week.   Hyponatremia:  Likely combination of dehydration and SIADH in the setting on pneumonia Sodium 127 on admission, Sodium improved with ivf,  ivf d/ced on 9/21due to concerns of fluids  overload  Hypokalemia/Hypomagnesemia:  Remain low, continue to replace k/mag, repeat lab in am  N/V: report chronic but progressively get worse None observed in the hospital, she dose has very poor oral intake She is on prn antiemetics   Urine retention of 700cc with lower abdominal discomfort on admission , required in and out foley Continue monitor Required another in and out last night  Treat constipation, start trial of flomax, foley placed, will need voiding trial once clinically stable  Lung mass hx of lung cancer, appears to be increasing in size compared to CT scan 2016  oncology Dr [name] consulted, input appreciated Per Dr [name]" "Would not recommend any biopsies for tissue diagnosis at this time . -Patient has very poor performance status and would not be a candidate for palliative chemotherapy even if there were recurrent lung cancer . -Would recommend defining detailed goals of care with the patient's healthcare proxy and family . -She has a lot of competing health priorities which would need to be taken into account to determine need for additional imaging and considerations for biopsy or treatment . -If the patient's goals of care to dictate follow-up for radiographic monitoring of lung cancer would recommend she follow-up with Dr. [name] in 8-12 weeks after treatment of recurrent pneumonia with consideration of repeat CT of the chest or if more data is needed at that point consideration of PET/CT scan "  Family/HCPOA donot wish to go through biopsy, chemo or radiation, want comfort focused care Detail please see palliative care note  H/o laryngeal cancer, detail unknown ENT Dr [name] consulted.   Weight loss , Severe malnutrition in context of chronic illness Body mass index is 16.21 kg/m. Nutrition  input appreciated  FTT: PT eval,  snf placement  Patient HPOA Santiago Glad requested palliative care consulted     Code Status: DNR   Family Communication: patient    Disposition Plan:  snf placement with palliative care, hospice?   Consultants:  Oncology  ENT  Palliative care  Speech therapy  Physical therapy  Social worker  Procedures:  none  Antibiotics:  Rocephin/zithro x1 in the ED  Vanc from admission to 9/20  zosyn from admission to 9/22  unasyn from 9/22   Objective: BP 114/87 (BP Location: Left Arm)   Pulse 85   Temp 98.4 F (36.9 C) (Oral)   Resp 16   Ht 5' (1.524 m)   Wt 37.6 kg (83 lb)   SpO2 93%   BMI 16.21 kg/m   Intake/Output Summary (Last 24 hours) at 09/21/17 0856 Last data filed at 09/21/17 0527  Gross per 24 hour  Intake               80 ml  Output              600 ml  Net             -520 ml   Filed Weights   09/16/17 1338  Weight: 37.6 kg (83 lb)    Exam: Patient is examined daily including today on 09/21/2017, exams remain the same as of yesterday except that has changed    General:  Thin, frail, s/p tracheostomy ,  aphonia (device malfunction)  Cardiovascular: RRR  Respiratory: less wheezing bilaterally  Abdomen: Soft/ND/NT, positive BS  Musculoskeletal: No Edema  Neuro: alert, oriented   Data Reviewed: Basic Metabolic Panel:  Recent Labs Lab 09/17/17 0602 09/18/17 0625 09/19/17 0549 09/20/17 0609 09/21/17 0600  NA 131* 134* 132* 135 137  K 3.6 4.0 4.0 3.3* 3.2*  CL 100* 101 104 101 100*  CO2 20* 16* 16* 24 23  GLUCOSE 76 94 83 108* 143*  BUN 9 12 21* 21* 26*  CREATININE 0.57 0.55 0.67 0.56 0.86  CALCIUM 8.1* 8.6* 8.0* 8.1* 8.0*  MG  --  1.3* 2.2 1.7 1.5*   Liver Function Tests:  Recent Labs Lab 09/18/17 0625  AST 35  ALT 19  ALKPHOS 63  BILITOT 1.7*  PROT 6.8  ALBUMIN 3.1*   No results for input(s): LIPASE, AMYLASE in the last 168 hours. No results for input(s): AMMONIA in the last 168 hours. CBC:  Recent Labs Lab 09/16/17 1442 09/17/17 0602 09/20/17 0609 09/21/17 0600  WBC 10.5 9.2 15.3* 13.3*  NEUTROABS  --   --  12.9* 12.3*  HGB 13.2  12.2 12.0 10.9*  HCT 38.7 36.5 35.0* 32.0*  MCV 91.9 93.1 92.1 92.2  PLT 321 307 322 325   Cardiac Enzymes:    Recent Labs Lab 09/16/17 1442  TROPONINI <0.03   BNP (last 3 results)  Recent Labs  09/16/17 1442  BNP 54.6    ProBNP (last 3 results) No results for input(s): PROBNP in the last 8760 hours.  CBG:  Recent Labs Lab 09/16/17 1444  GLUCAP 82    Recent Results (from the past 240 hour(s))  Culture, Urine     Status: Abnormal   Collection Time: 09/16/17  2:23 PM  Result Value Ref Range Status   Specimen Description URINE, RANDOM  Final   Special Requests NONE  Final   Culture MULTIPLE SPECIES PRESENT, SUGGEST RECOLLECTION (A)  Final   Report Status 09/18/2017 FINAL  Final  Culture, blood (Routine  X 2) w Reflex to ID Panel     Status: None (Preliminary result)   Collection Time: 09/16/17  4:06 PM  Result Value Ref Range Status   Specimen Description BLOOD RIGHT FOREARM  Final   Special Requests   Final    BOTTLES DRAWN AEROBIC AND ANAEROBIC Blood Culture adequate volume   Culture   Final    NO GROWTH 4 DAYS Performed at [facility], [address], [place], [place] [address]    Report Status PENDING  Incomplete  Culture, blood (Routine X 2) w Reflex to ID Panel     Status: None (Preliminary result)   Collection Time: 09/16/17  4:18 PM  Result Value Ref Range Status   Specimen Description BLOOD LEFT ANTECUBITAL  Final   Special Requests   Final    BOTTLES DRAWN AEROBIC AND ANAEROBIC Blood Culture adequate volume   Culture   Final    NO GROWTH 4 DAYS Performed at [facility], [place] [address] [place], [place] [address]    Report Status PENDING  Incomplete  MRSA PCR Screening     Status: None   Collection Time: 09/17/17  5:25 AM  Result Value Ref Range Status   MRSA by PCR NEGATIVE NEGATIVE Final    Comment:        The GeneXpert MRSA Assay (FDA approved for NASAL specimens only), is one component of a comprehensive MRSA  colonization surveillance program. It is not intended to diagnose MRSA infection nor to guide or monitor treatment for MRSA infections.   Culture, expectorated sputum-assessment     Status: None   Collection Time: 09/17/17  9:07 AM  Result Value Ref Range Status   Specimen Description SPUTUM  Final   Special Requests NONE  Final   Sputum evaluation THIS SPECIMEN IS ACCEPTABLE FOR SPUTUM CULTURE  Final   Report Status 09/17/2017 FINAL  Final  Culture, respiratory (NON-Expectorated)     Status: None   Collection Time: 09/17/17  9:07 AM  Result Value Ref Range Status   Specimen Description SPUTUM  Final   Special Requests NONE Reflexed from [id number]  Final   Gram Stain   Final    DEGENERATED CELLULAR MATERIAL PRESENT NO WBC IDENTIFIED NO ORGANISMS SEEN    Culture   Final    NO GROWTH 2 DAYS Performed at [facility], [address] [place], [place] [address]    Report Status 09/19/2017 FINAL  Final  Culture, expectorated sputum-assessment     Status: None   Collection Time: 09/20/17  2:00 AM  Result Value Ref Range Status   Specimen Description SPUTUM  Final   Special Requests NONE  Final   Sputum evaluation   Final    THIS SPECIMEN IS ACCEPTABLE FOR SPUTUM CULTURE Performed at [facility], [place] [address], [place], [place] [address]    Report Status 09/20/2017 FINAL  Final  Culture, respiratory (NON-Expectorated)     Status: None (Preliminary result)   Collection Time: 09/20/17  2:00 AM  Result Value Ref Range Status   Specimen Description SPUTUM  Final   Special Requests NONE Reflexed from [id number]  Final   Gram Stain   Final    FEW WBC PRESENT, PREDOMINANTLY PMN ABUNDANT YEAST WITH PSEUDOHYPHAE RARE GRAM POSITIVE COCCI RARE GRAM POSITIVE RODS Performed at [facility], [place] [address], [place], [place] [address]    Culture PENDING  Incomplete   Report Status PENDING  Incomplete  Studies: No results found.  Scheduled Meds: . aspirin EC  81 mg  Oral Daily  . dorzolamide-timolol  1 drop Right Eye BID  . enoxaparin (LOVENOX) injection  30 mg Subcutaneous Q24H  . famotidine  20 mg Oral Daily  . guaiFENesin  5 mL Oral Q6H  . ipratropium-albuterol  3 mL Nebulization Q6H  . lactose free nutrition  237 mL Oral TID WC  . methylPREDNISolone (SOLU-MEDROL) injection  60 mg Intravenous Q6H  . pantoprazole  40 mg Oral Daily  . polyethylene glycol  17 g Oral Daily  . potassium chloride  40 mEq Oral Once  . senna  1 tablet Oral BID  . sertraline  25 mg Oral Daily  . tamsulosin  0.4 mg Oral Daily    Continuous Infusions: . ampicillin-sulbactam (UNASYN) IV Stopped (09/21/17 0257)  . magnesium sulfate 1 - 4 g bolus IVPB       Time spent: 75mins I have personally reviewed and interpreted on  09/21/2017 daily labs, tele strips, imagings as discussed above under data review session and assessment and plans.  I reviewed all nursing notes, pharmacy notes,  vitals, pertinent old records  I have discussed plan of care as described above with RN , patient  on 09/21/2017  Case discussed with palliative care Dr Rowe Pavy.   Donatello Kleve MD, PhD  [facility] Pager [phone]. If 7PM-7AM, please contact night-coverage at [email or web address], password [facility] 09/21/2017, 8:56 AM  LOS: 5 days

## 2017-09-22 LAB — CBC WITH DIFFERENTIAL/PLATELET
BASOS PCT: 0 %
Basophils Absolute: 0 10*3/uL (ref 0.0–0.1)
Eosinophils Absolute: 0 10*3/uL (ref 0.0–0.7)
Eosinophils Relative: 0 %
HCT: 32.8 % — ABNORMAL LOW (ref 36.0–46.0)
HEMOGLOBIN: 11 g/dL — AB (ref 12.0–15.0)
LYMPHS ABS: 0.4 10*3/uL — AB (ref 0.7–4.0)
Lymphocytes Relative: 3 %
MCH: 30.7 pg (ref 26.0–34.0)
MCHC: 33.5 g/dL (ref 30.0–36.0)
MCV: 91.6 fL (ref 78.0–100.0)
MONO ABS: 0.9 10*3/uL (ref 0.1–1.0)
MONOS PCT: 7 %
NEUTROS ABS: 10.5 10*3/uL — AB (ref 1.7–7.7)
NEUTROS PCT: 90 %
Platelets: 355 10*3/uL (ref 150–400)
RBC: 3.58 MIL/uL — ABNORMAL LOW (ref 3.87–5.11)
RDW: 13.1 % (ref 11.5–15.5)
WBC: 11.7 10*3/uL — ABNORMAL HIGH (ref 4.0–10.5)

## 2017-09-22 LAB — COMPREHENSIVE METABOLIC PANEL
ALK PHOS: 52 U/L (ref 38–126)
ALT: 28 U/L (ref 14–54)
AST: 45 U/L — ABNORMAL HIGH (ref 15–41)
Albumin: 2.3 g/dL — ABNORMAL LOW (ref 3.5–5.0)
Anion gap: 8 (ref 5–15)
BUN: 28 mg/dL — ABNORMAL HIGH (ref 6–20)
CALCIUM: 8.2 mg/dL — AB (ref 8.9–10.3)
CO2: 24 mmol/L (ref 22–32)
CREATININE: 0.51 mg/dL (ref 0.44–1.00)
Chloride: 100 mmol/L — ABNORMAL LOW (ref 101–111)
Glucose, Bld: 148 mg/dL — ABNORMAL HIGH (ref 65–99)
Potassium: 4.4 mmol/L (ref 3.5–5.1)
Sodium: 132 mmol/L — ABNORMAL LOW (ref 135–145)
TOTAL PROTEIN: 5.9 g/dL — AB (ref 6.5–8.1)
Total Bilirubin: 0.7 mg/dL (ref 0.3–1.2)

## 2017-09-22 LAB — MAGNESIUM: MAGNESIUM: 2.2 mg/dL (ref 1.7–2.4)

## 2017-09-22 MED ORDER — METHYLPREDNISOLONE SODIUM SUCC 125 MG IJ SOLR
60.0000 mg | Freq: Two times a day (BID) | INTRAMUSCULAR | Status: DC
  Administered 2017-09-22 – 2017-09-23 (×2): 60 mg via INTRAVENOUS
  Filled 2017-09-22 (×2): qty 2

## 2017-09-22 MED ORDER — SODIUM CHLORIDE 0.9 % IV SOLN
1.5000 g | Freq: Three times a day (TID) | INTRAVENOUS | Status: DC
  Administered 2017-09-22 – 2017-09-25 (×9): 1.5 g via INTRAVENOUS
  Filled 2017-09-22 (×11): qty 1.5

## 2017-09-22 MED ORDER — IPRATROPIUM-ALBUTEROL 0.5-2.5 (3) MG/3ML IN SOLN
3.0000 mL | Freq: Three times a day (TID) | RESPIRATORY_TRACT | Status: DC
  Administered 2017-09-23 – 2017-09-25 (×8): 3 mL via RESPIRATORY_TRACT
  Filled 2017-09-22 (×8): qty 3

## 2017-09-22 NOTE — Care Management Note (Signed)
Case Management Note  Patient Details  Name: Kim Hardy MRN: [id number] Date of Birth: May 13, 1931  Subjective/Objective:                  HCAP  Action/Plan: Date:  September 22, 2017 Chart reviewed for concurrent status and case management needs.  Will continue to follow patient progress.  Discharge Planning: following for needs /plan is for SNF placement. Expected discharge date: [id number]  Velva Harman, BSN, [place], [place]    Expected Discharge Date:   (unknown)               Expected Discharge Plan:  Home/Self Care  In-House Referral:     Discharge planning Services  CM Consult  Post Acute Care Choice:    Choice offered to:     DME Arranged:    DME Agency:     HH Arranged:    HH Agency:     Status of Service:  In process, will continue to follow  If discussed at Long Length of Stay Meetings, dates discussed:    Additional Comments:  Leeroy Cha, RN 09/22/2017, 10:06 AM

## 2017-09-22 NOTE — Progress Notes (Signed)
PHARMACY NOTE:  ANTIMICROBIAL RENAL DOSAGE ADJUSTMENT  Current antimicrobial regimen includes a mismatch between antimicrobial dosage and estimated renal function.  As per policy approved by the Pharmacy & Therapeutics and Medical Executive Committees, the antimicrobial dosage will be adjusted accordingly.  Current antimicrobial dosage:  Unasyn 3g IV q12 hr  Indication: Aspiration pneumonia  Renal Function:  Estimated Creatinine Clearance: 31.1 mL/min (by C-G formula based on SCr of 0.51 mg/dL). []      On intermittent HD, scheduled: []      On CRRT    Antimicrobial dosage has been changed to:  Unasyn 1.5g IV q8 hr  Given very low weight and CrCl ~30 at baseline, will optimize time-dependent killing by using lower, more frequent doses.  Thank you for allowing pharmacy to be a part of this patient's care.  Reuel Boom, PharmD, BCPS Pager: [phone] 09/22/2017, 3:55 PM

## 2017-09-22 NOTE — Care Management Important Message (Signed)
Important Message  Patient Details  Name: MAISON AGRUSA MRN: [id number] Date of Birth: 02/23/1931   Medicare Important Message Given:  Yes    Kerin Salen 09/22/2017, 12:35 [place] Message  Patient Details  Name: KENASIA SCHELLER MRN: [id number] Date of Birth: 08-10-1931   Medicare Important Message Given:  Yes    Kerin Salen 09/22/2017, 12:35 PM

## 2017-09-22 NOTE — Progress Notes (Signed)
PT Cancellation Note  Patient Details Name: Kim Hardy MRN: [id number] DOB: 10-02-1931   Cancelled Treatment:     speech therapist in room.  Will attempt to see later as schedule permits.  Pt has been evaluated with rec for SNF.    Rica Koyanagi  PTA [facility]  Acute  Rehab Pager      [phone]

## 2017-09-22 NOTE — Progress Notes (Addendum)
SLP spoke to patient regarding TEP. Confirmed last replacement per records at [facility] was late June 2018.  TEP appeared completely clogged with secretions - asked RT to examine it for possible cleaning. Pt stated 'It wouldn't hurt" and "It needs to be cleaned".  Pt reports TEP was cleaned by her every day but has not been cleaned in 2 weeks.  RT Kennan examined TEP and cleaned outer edge but unable to remove clogged secretions.      Pt denies coughing with intake and RN denies pt having overt symptoms of dysphagia.   Dysfunctional TEP concerning for increased aspiration risk and Dr Erlinda Hong reports being concerned pt may be aspirating.   Provided pt with a dry erase board for expressive communication as she had declined use of electrolarynx.  SLP advised Rehab secretary to order TEP per Dr Janeice Robinson request on Friday 9/21, 2018.   Provox Vega Size 17 French 4 mm to be ordered by our [employer] today= as this was the last TEP placed per [facility] hospital records. Informed pt, MD and RN of plan.   SLP will phone ENT office when receive information regarding delivery date upon receipt.  Luanna Salk, [place] [facility] SLP [phone]     SLP left message on Dr Janeice Robinson assistant voicemail at approximately 1510 regarding TEP and arrival date/time.  Advised TEP should be available tomorrow afternoon for placement.  Thanks.

## 2017-09-22 NOTE — Progress Notes (Addendum)
PROGRESS NOTE  Kim Hardy ZGY:[id number] DOB: 1931-10-01 DOA: 09/16/2017 PCP: [name], MD  HPI/Recap of past 24 hours:   Stable, no fever, aphonia due to  TEP malfunction. No cough,  She looks comfortable this am no fever, no edema  no n/v since admitted to the hospital   Assessment/Plan: Principal Problem:   HCAP (healthcare-associated pneumonia) Active Problems:   laryngeal ca   Lung cancer ([place])   Protein-calorie malnutrition, severe   Generalized weakness   Encounter for palliative care   Goals of care, counseling/discussion   Lobar Pneumonia/acute hypoxia respiratory failure, likely from mucus plugging, aspiration pneumonia -She presented with sob, tachypnea, sinus tachycardia, increase sputum production through chronic tracheostomy  -CTA on admission no PE, but with possible pneumonia -pneumonia possible due to postobstructive+/- Aspiration (with chronic n/v) -She received deep suction through stoma ( with thick mucus suctioned out), she could not  Tolerate chest PT due to pain -she received rocephin/zithro in the ED, abx changed to vanc/zosyn on admission, blood culture no growth, sputum culture in process, d/c vanc on 9/20,  zosyn changed to unasyn on 9/22 to cover possible aspiration pneumonia -due to wheezing, she is started on steroids, schedule nebs, additional dose of lasix given on 9/22,  -continue to improve, taper steroids, continue nebs /anx.  TEP malfunction: aphonia and likely cause of aspiration pneumonia -Swallow eval , case discussed with speech therapist , apparently patient has been having issues with her transesophageal puncture prosthesis for a while, this might leads to recurrent aspiration pneumonia. - on 9/21, Case discussed with ENT Dr [name] who has known this patient for the last 50yrs.  Dr [name] plan to have the device changed next week.  -appreciated speech and ENT input, awaiting TEP delivery and replacement. Likely able to d/c to SNF  afterwards.  Hyponatremia: Sodium 127 on admission, -Likely combination of dehydration and SIADH in the setting on pneumonia -Sodium improved with ivf,  ivf d/ced on 9/21due to concerns of fluids overload  Hypokalemia/Hypomagnesemia:  Improved after replacement,  keep K>4, mag >2. repalce prn.  N/V: report chronic but progressively got worse -None observed in the hospital, she dose has very poor oral intake -She is on prn antiemetics   Urine retention of 700cc with lower abdominal discomfort on admission ,  -required in and out foleyx2 -avoid constipation, she is started on flomax, foley placed, will need voiding trial once clinically stable  Lung mass hx of lung cancer, appears to be increasing in size compared to CT scan 2016  oncology Dr [name] consulted, input appreciated Per Dr [name]" "Would not recommend any biopsies for tissue diagnosis at this time . -Patient has very poor performance status and would not be a candidate for palliative chemotherapy even if there were recurrent lung cancer . -Would recommend defining detailed goals of care with the patient's healthcare proxy and family . -She has a lot of competing health priorities which would need to be taken into account to determine need for additional imaging and considerations for biopsy or treatment . -If the patient's goals of care to dictate follow-up for radiographic monitoring of lung cancer would recommend she follow-up with Dr. [name] in 8-12 weeks after treatment of recurrent pneumonia with consideration of repeat CT of the chest or if more data is needed at that point consideration of PET/CT scan "  Family/HCPOA donot wish to go through biopsy, chemo or radiation. want comfort focused care Detail please see palliative care note  H/o laryngeal  cancer, detail unknown ENT Dr [name] consulted.   Weight loss , Severe malnutrition in context of chronic illness Body mass index is 16.21 kg/m. Nutrition input  appreciated  FTT: PT eval,  snf placement  Patient HPOA Santiago Glad requested palliative care consulted     Code Status: DNR   Family Communication: patient and niece karen at bedside on 9/23  Disposition Plan:  snf placement with palliative care, may need hospice? soon   Consultants:  Oncology  ENT  Palliative care  Speech therapy  Physical therapy  Social worker  Procedures:  none  Antibiotics:  Rocephin/zithro x1 in the ED  Vanc from admission to 9/20  zosyn from admission to 9/22  unasyn from 9/22   Objective: BP 95/70 (BP Location: Right Arm)   Pulse (!) 109   Temp 97.9 F (36.6 C) (Oral)   Resp 18   Ht 5' (1.524 m)   Wt 37.6 kg (83 lb)   SpO2 93%   BMI 16.21 kg/m   Intake/Output Summary (Last 24 hours) at 09/22/17 1221 Last data filed at 09/21/17 2143  Gross per 24 hour  Intake              460 ml  Output              200 ml  Net              260 ml   Filed Weights   09/16/17 1338  Weight: 37.6 kg (83 lb)    Exam: Patient is examined daily including today on 09/22/2017, exams remain the same as of yesterday except that has changed    General:  Thin, frail, s/p tracheostomy ,  aphonia (device malfunction)  Cardiovascular: RRR  Respiratory: less wheezing bilaterally, + rhonchi   Abdomen: Soft/ND/NT, positive BS  Musculoskeletal: No Edema  Neuro: alert, oriented   Data Reviewed: Basic Metabolic Panel:  Recent Labs Lab 09/18/17 0625 09/19/17 0549 09/20/17 0609 09/21/17 0600 09/22/17 0644  NA 134* 132* 135 137 132*  K 4.0 4.0 3.3* 3.2* 4.4  CL 101 104 101 100* 100*  CO2 16* 16* 24 23 24   GLUCOSE 94 83 108* 143* 148*  BUN 12 21* 21* 26* 28*  CREATININE 0.55 0.67 0.56 0.86 0.51  CALCIUM 8.6* 8.0* 8.1* 8.0* 8.2*  MG 1.3* 2.2 1.7 1.5* 2.2   Liver Function Tests:  Recent Labs Lab 09/18/17 0625 09/22/17 0644  AST 35 45*  ALT 19 28  ALKPHOS 63 52  BILITOT 1.7* 0.7  PROT 6.8 5.9*  ALBUMIN 3.1* 2.3*   No results for  input(s): LIPASE, AMYLASE in the last 168 hours. No results for input(s): AMMONIA in the last 168 hours. CBC:  Recent Labs Lab 09/16/17 1442 09/17/17 0602 09/20/17 0609 09/21/17 0600 09/22/17 0644  WBC 10.5 9.2 15.3* 13.3* 11.7*  NEUTROABS  --   --  12.9* 12.3* 10.5*  HGB 13.2 12.2 12.0 10.9* 11.0*  HCT 38.7 36.5 35.0* 32.0* 32.8*  MCV 91.9 93.1 92.1 92.2 91.6  PLT 321 307 322 325 355   Cardiac Enzymes:    Recent Labs Lab 09/16/17 1442  TROPONINI <0.03   BNP (last 3 results)  Recent Labs  09/16/17 1442  BNP 54.6    ProBNP (last 3 results) No results for input(s): PROBNP in the last 8760 hours.  CBG:  Recent Labs Lab 09/16/17 1444  GLUCAP 82    Recent Results (from the past 240 hour(s))  Culture, Urine     Status:  Abnormal   Collection Time: 09/16/17  2:23 PM  Result Value Ref Range Status   Specimen Description URINE, RANDOM  Final   Special Requests NONE  Final   Culture MULTIPLE SPECIES PRESENT, SUGGEST RECOLLECTION (A)  Final   Report Status 09/18/2017 FINAL  Final  Culture, blood (Routine X 2) w Reflex to ID Panel     Status: None   Collection Time: 09/16/17  4:06 PM  Result Value Ref Range Status   Specimen Description BLOOD RIGHT FOREARM  Final   Special Requests   Final    BOTTLES DRAWN AEROBIC AND ANAEROBIC Blood Culture adequate volume   Culture   Final    NO GROWTH 5 DAYS Performed at [facility], [address], [place], [place] [address]    Report Status 09/21/2017 FINAL  Final  Culture, blood (Routine X 2) w Reflex to ID Panel     Status: None   Collection Time: 09/16/17  4:18 PM  Result Value Ref Range Status   Specimen Description BLOOD LEFT ANTECUBITAL  Final   Special Requests   Final    BOTTLES DRAWN AEROBIC AND ANAEROBIC Blood Culture adequate volume   Culture   Final    NO GROWTH 5 DAYS Performed at [facility], [place] [address], [place], [place] [address]    Report Status 09/21/2017 FINAL  Final  MRSA PCR  Screening     Status: None   Collection Time: 09/17/17  5:25 AM  Result Value Ref Range Status   MRSA by PCR NEGATIVE NEGATIVE Final    Comment:        The GeneXpert MRSA Assay (FDA approved for NASAL specimens only), is one component of a comprehensive MRSA colonization surveillance program. It is not intended to diagnose MRSA infection nor to guide or monitor treatment for MRSA infections.   Culture, expectorated sputum-assessment     Status: None   Collection Time: 09/17/17  9:07 AM  Result Value Ref Range Status   Specimen Description SPUTUM  Final   Special Requests NONE  Final   Sputum evaluation THIS SPECIMEN IS ACCEPTABLE FOR SPUTUM CULTURE  Final   Report Status 09/17/2017 FINAL  Final  Culture, respiratory (NON-Expectorated)     Status: None   Collection Time: 09/17/17  9:07 AM  Result Value Ref Range Status   Specimen Description SPUTUM  Final   Special Requests NONE Reflexed from [id number]  Final   Gram Stain   Final    DEGENERATED CELLULAR MATERIAL PRESENT NO WBC IDENTIFIED NO ORGANISMS SEEN    Culture   Final    NO GROWTH 2 DAYS Performed at [facility], [address], [place], [place] [address]    Report Status 09/19/2017 FINAL  Final  Culture, expectorated sputum-assessment     Status: None   Collection Time: 09/20/17  2:00 AM  Result Value Ref Range Status   Specimen Description SPUTUM  Final   Special Requests NONE  Final   Sputum evaluation   Final    THIS SPECIMEN IS ACCEPTABLE FOR SPUTUM CULTURE Performed at [facility], [place] [address] [place], [place] [address]    Report Status 09/20/2017 FINAL  Final  Culture, respiratory (NON-Expectorated)     Status: None (Preliminary result)   Collection Time: 09/20/17  2:00 AM  Result Value Ref Range Status   Specimen Description SPUTUM  Final   Special Requests NONE Reflexed from [id number]  Final   Gram Stain  Final    FEW WBC PRESENT, PREDOMINANTLY PMN ABUNDANT YEAST WITH PSEUDOHYPHAE RARE  GRAM POSITIVE COCCI RARE GRAM POSITIVE RODS    Culture   Final    CULTURE REINCUBATED FOR BETTER GROWTH Performed at [facility], [place] [address] [place],  [address]    Report Status PENDING  Incomplete     Studies: No results found.  Scheduled Meds: . aspirin EC  81 mg Oral Daily  . dorzolamide-timolol  1 drop Right Eye BID  . enoxaparin (LOVENOX) injection  30 mg Subcutaneous Q24H  . famotidine  20 mg Oral Daily  . guaiFENesin  5 mL Oral Q6H  . ipratropium-albuterol  3 mL Nebulization Q6H  . lactose free nutrition  237 mL Oral TID WC  . methylPREDNISolone (SOLU-MEDROL) injection  60 mg Intravenous Q12H  . pantoprazole  40 mg Oral Daily  . polyethylene glycol  17 g Oral Daily  . senna  1 tablet Oral BID  . sertraline  25 mg Oral Daily  . tamsulosin  0.4 mg Oral Daily    Continuous Infusions: . ampicillin-sulbactam (UNASYN) IV Stopped (09/22/17 1021)     Time spent: 63mins I have personally reviewed and interpreted on  09/22/2017 daily labs, tele strips, imagings as discussed above under data review session and assessment and plans.  I reviewed all nursing notes, pharmacy notes,  vitals, pertinent old records  I have discussed plan of care as described above with RN , patient  on 09/22/2017   Adeli Frost MD, PhD  [facility] Pager [phone]. If 7PM-7AM, please contact night-coverage at [email or web address], password [email or web address] 09/22/2017, 12:21 PM  LOS: 6 days

## 2017-09-22 NOTE — Care Management Important Message (Signed)
Important Message  Patient Details  Name: Kim Hardy MRN: [id number] Date of Birth: [birth year]   Medicare Important Message Given:  Yes    Kerin Salen 09/22/2017, 12:38 [place] Message  Patient Details  Name: Kim Hardy MRN: [id number] Date of Birth: [birth year]   Medicare Important Message Given:  Yes    Kerin Salen 09/22/2017, 12:38 PM

## 2017-09-22 NOTE — Progress Notes (Signed)
   09/22/17 1400  Clinical Encounter Type  Visited With Patient  Visit Type Initial  Spiritual Encounters  Spiritual Needs Prayer   Visited with patient as part of palliative follow up.  Patient was not able to speak,but was alert and able to answer yes and no questions.  She was having a good day despite the condition.  In speaking with the healthcare providers patient has niece with healthcare power and comes by some.  Not sure about any other family contact.  Will pass on to staff chaplains to follow up with patient. Chaplain Katherene Ponto

## 2017-09-23 LAB — CULTURE, RESPIRATORY W GRAM STAIN

## 2017-09-23 LAB — BASIC METABOLIC PANEL
ANION GAP: 11 (ref 5–15)
BUN: 24 mg/dL — ABNORMAL HIGH (ref 6–20)
CO2: 25 mmol/L (ref 22–32)
Calcium: 8 mg/dL — ABNORMAL LOW (ref 8.9–10.3)
Chloride: 98 mmol/L — ABNORMAL LOW (ref 101–111)
Creatinine, Ser: 0.45 mg/dL (ref 0.44–1.00)
Glucose, Bld: 146 mg/dL — ABNORMAL HIGH (ref 65–99)
POTASSIUM: 3.5 mmol/L (ref 3.5–5.1)
SODIUM: 134 mmol/L — AB (ref 135–145)

## 2017-09-23 LAB — CULTURE, RESPIRATORY

## 2017-09-23 LAB — CBC
HCT: 37.4 % (ref 36.0–46.0)
Hemoglobin: 12.8 g/dL (ref 12.0–15.0)
MCH: 31.3 pg (ref 26.0–34.0)
MCHC: 34.2 g/dL (ref 30.0–36.0)
MCV: 91.4 fL (ref 78.0–100.0)
PLATELETS: 438 10*3/uL — AB (ref 150–400)
RBC: 4.09 MIL/uL (ref 3.87–5.11)
RDW: 13.1 % (ref 11.5–15.5)
WBC: 18.3 10*3/uL — AB (ref 4.0–10.5)

## 2017-09-23 MED ORDER — PREDNISONE 50 MG PO TABS
60.0000 mg | ORAL_TABLET | Freq: Every day | ORAL | Status: AC
  Filled 2017-09-23: qty 1

## 2017-09-23 NOTE — Progress Notes (Addendum)
PROGRESS NOTE  Kim Hardy JFH:[id number] DOB: 1931/04/24 DOA: 09/16/2017 PCP: [name], MD  Brief Summary:   H/o Lung cancer, laryngeal cancer patient, tracheostomy status. Presented with progressive n/v, weakness, poor by mouth intake,  SOB, increased sputum purulence. Chest CT- pneumonia, increasing lung mass. Oncology/palliative care consulted, family want comfort focused care. ENT Dr [name] to switch out TEP, hopfully on 9/26, then patient can be discharged to SNF with palliative care.   HPI/Recap of past 24 hours:   Stable, no fever,  No edema, aphonia due to  TEP malfunction.   no n/v since admitted to the hospital   Assessment/Plan: Principal Problem:   HCAP (healthcare-associated pneumonia) Active Problems:   laryngeal ca   Lung cancer ([place])   Protein-calorie malnutrition, severe   Generalized weakness   Encounter for palliative care   Goals of care, counseling/discussion   Lobar Pneumonia/acute hypoxia respiratory failure, likely from mucus plugging, aspiration pneumonia -She presented with sob, tachypnea, sinus tachycardia, increase sputum production through chronic tracheostomy  -CTA on admission no PE, but with possible pneumonia -pneumonia possible due to postobstructive+/- Aspiration (with chronic n/v) -She received deep suction through stoma ( with thick mucus suctioned out), she could not  Tolerate chest PT due to pain -she received rocephin/zithro in the ED, abx changed to vanc/zosyn on admission, blood culture no growth, sputum culture + yeast, d/c vanc on 9/20,  zosyn changed to unasyn on 9/22 to cover possible aspiration pneumonia -due to wheezing, she is started on steroids, schedule nebs, additional dose of lasix given on 9/22,  -wheezing has resolved, but continue to have rhonchi ( suspect continued aspiration), taper steroids, continue nebs /abx. (leukocytosis likely related to steroids) -expect improvement after TEP replacement.  TEP  malfunction: aphonia and likely cause of aspiration pneumonia - apparently patient has been having issues with her transesophageal puncture prosthesis for a while, this might leads to recurrent aspiration pneumonia. - on 9/21, Case discussed with ENT Dr [name] who has known this patient for the last 48yrs.  Dr [name] plan to have the device changed . -appreciated speech and ENT input, awaiting TEP delivery and replacement. Likely able to d/c to SNF with palliative care afterwards.  Hyponatremia: Sodium 127 on admission, -Likely combination of dehydration and SIADH in the setting on pneumonia -Sodium improved with ivf,  ivf d/ced on 9/21due to concerns of fluids overload  Hypokalemia/Hypomagnesemia:  Improved after replacement,  keep K>4, mag >2. repalce prn.  N/V: report chronic but progressively got worse -None observed in the hospital, she dose has very poor oral intake -She is on prn antiemetics   Urine retention of 700cc with lower abdominal discomfort on admission ,  -required in and out foleyx2 -avoid constipation, she is started on flomax, foley placed,  -she pulled out her foley on 9/25 early am, will observe off foley, repeat bladder scan in am.  Lung mass -hx of lung cancer, appears to be increasing in size compared to CT scan 2016  -oncology Dr [name] consulted, input appreciated Per Dr [name]" "Would not recommend any biopsies for tissue diagnosis at this time . -Patient has very poor performance status and would not be a candidate for palliative chemotherapy even if there were recurrent lung cancer . -Would recommend defining detailed goals of care with the patient's healthcare proxy and family . -She has a lot of competing health priorities which would need to be taken into account to determine need for additional imaging and considerations for biopsy or  treatment . -If the patient's goals of care to dictate follow-up for radiographic monitoring of lung cancer would recommend  she follow-up with Dr. [name] in 8-12 weeks after treatment of recurrent pneumonia with consideration of repeat CT of the chest or if more data is needed at that point consideration of PET/CT scan "  -Family/HCPOA donot wish to go through biopsy, chemo or radiation. want comfort focused care. Details please see palliative care note  H/o laryngeal cancer, detail unknown ENT Dr [name] consulted.   Weight loss , Severe malnutrition in context of chronic illness Body mass index is 16.21 kg/m. Nutrition input appreciated  FTT: PT eval,  snf placement     Code Status: DNR   Family Communication: patient and niece Kim Hardy at bedside on 9/23  Disposition Plan:  snf placement with palliative care after TEP replacement  may need hospice? soon   Consultants:  Oncology  ENT  Palliative care  Speech therapy  Physical therapy  Social worker  Procedures:  Plan to have TEP replacement by ENT  Antibiotics:  Rocephin/zithro x1 in the ED  Vanc from admission to 9/20  zosyn from admission to 9/22  unasyn from 9/22   Objective: BP 106/71 (BP Location: Left Arm)   Pulse (!) 114   Temp 99.1 F (37.3 C) (Oral)   Resp 20   Ht 5' (1.524 m)   Wt 37.6 kg (83 lb)   SpO2 93%   BMI 16.21 kg/m   Intake/Output Summary (Last 24 hours) at 09/23/17 1731 Last data filed at 09/23/17 1720  Gross per 24 hour  Intake              150 ml  Output             1003 ml  Net             -853 ml   Filed Weights   09/16/17 1338  Weight: 37.6 kg (83 lb)    Exam: Patient is examined daily including today on 09/23/2017, exams remain the same as of yesterday except that has changed    General:  Thin, frail, s/p tracheostomy ,  aphonia (device malfunction)  Cardiovascular: RRR  Respiratory: less wheezing bilaterally, + rhonchi   Abdomen: Soft/ND/NT, positive BS  Musculoskeletal: No Edema  Neuro: alert, oriented   Data Reviewed: Basic Metabolic Panel:  Recent Labs Lab  09/18/17 0625 09/19/17 0549 09/20/17 0609 09/21/17 0600 09/22/17 0644 09/23/17 0634  NA 134* 132* 135 137 132* 134*  K 4.0 4.0 3.3* 3.2* 4.4 3.5  CL 101 104 101 100* 100* 98*  CO2 16* 16* 24 23 24 25   GLUCOSE 94 83 108* 143* 148* 146*  BUN 12 21* 21* 26* 28* 24*  CREATININE 0.55 0.67 0.56 0.86 0.51 0.45  CALCIUM 8.6* 8.0* 8.1* 8.0* 8.2* 8.0*  MG 1.3* 2.2 1.7 1.5* 2.2  --    Liver Function Tests:  Recent Labs Lab 09/18/17 0625 09/22/17 0644  AST 35 45*  ALT 19 28  ALKPHOS 63 52  BILITOT 1.7* 0.7  PROT 6.8 5.9*  ALBUMIN 3.1* 2.3*   No results for input(s): LIPASE, AMYLASE in the last 168 hours. No results for input(s): AMMONIA in the last 168 hours. CBC:  Recent Labs Lab 09/17/17 0602 09/20/17 0609 09/21/17 0600 09/22/17 0644 09/23/17 0634  WBC 9.2 15.3* 13.3* 11.7* 18.3*  NEUTROABS  --  12.9* 12.3* 10.5*  --   HGB 12.2 12.0 10.9* 11.0* 12.8  HCT 36.5 35.0* 32.0* 32.8*  37.4  MCV 93.1 92.1 92.2 91.6 91.4  PLT 307 322 325 355 438*   Cardiac Enzymes:   No results for input(s): CKTOTAL, CKMB, CKMBINDEX, TROPONINI in the last 168 hours. BNP (last 3 results)  Recent Labs  09/16/17 1442  BNP 54.6    ProBNP (last 3 results) No results for input(s): PROBNP in the last 8760 hours.  CBG: No results for input(s): GLUCAP in the last 168 hours.  Recent Results (from the past 240 hour(s))  Culture, Urine     Status: Abnormal   Collection Time: 09/16/17  2:23 PM  Result Value Ref Range Status   Specimen Description URINE, RANDOM  Final   Special Requests NONE  Final   Culture MULTIPLE SPECIES PRESENT, SUGGEST RECOLLECTION (A)  Final   Report Status 09/18/2017 FINAL  Final  Culture, blood (Routine X 2) w Reflex to ID Panel     Status: None   Collection Time: 09/16/17  4:06 PM  Result Value Ref Range Status   Specimen Description BLOOD RIGHT FOREARM  Final   Special Requests   Final    BOTTLES DRAWN AEROBIC AND ANAEROBIC Blood Culture adequate volume    Culture   Final    NO GROWTH 5 DAYS Performed at [facility], [address], [place], [place] [address]    Report Status 09/21/2017 FINAL  Final  Culture, blood (Routine X 2) w Reflex to ID Panel     Status: None   Collection Time: 09/16/17  4:18 PM  Result Value Ref Range Status   Specimen Description BLOOD LEFT ANTECUBITAL  Final   Special Requests   Final    BOTTLES DRAWN AEROBIC AND ANAEROBIC Blood Culture adequate volume   Culture   Final    NO GROWTH 5 DAYS Performed at [facility], [place] [address], [place], [place] [address]    Report Status 09/21/2017 FINAL  Final  MRSA PCR Screening     Status: None   Collection Time: 09/17/17  5:25 AM  Result Value Ref Range Status   MRSA by PCR NEGATIVE NEGATIVE Final    Comment:        The GeneXpert MRSA Assay (FDA approved for NASAL specimens only), is one component of a comprehensive MRSA colonization surveillance program. It is not intended to diagnose MRSA infection nor to guide or monitor treatment for MRSA infections.   Culture, expectorated sputum-assessment     Status: None   Collection Time: 09/17/17  9:07 AM  Result Value Ref Range Status   Specimen Description SPUTUM  Final   Special Requests NONE  Final   Sputum evaluation THIS SPECIMEN IS ACCEPTABLE FOR SPUTUM CULTURE  Final   Report Status 09/17/2017 FINAL  Final  Culture, respiratory (NON-Expectorated)     Status: None   Collection Time: 09/17/17  9:07 AM  Result Value Ref Range Status   Specimen Description SPUTUM  Final   Special Requests NONE Reflexed from [id number]  Final   Gram Stain   Final    DEGENERATED CELLULAR MATERIAL PRESENT NO WBC IDENTIFIED NO ORGANISMS SEEN    Culture   Final    NO GROWTH 2 DAYS Performed at [facility], [address], [place], [place] [address]    Report Status 09/19/2017 FINAL  Final  Culture, expectorated sputum-assessment     Status: None   Collection Time: 09/20/17  2:00 AM  Result Value Ref Range  Status   Specimen Description SPUTUM  Final  Special Requests NONE  Final   Sputum evaluation   Final    THIS SPECIMEN IS ACCEPTABLE FOR SPUTUM CULTURE Performed at [facility], [place] [address], [place], [place] [address]    Report Status 09/20/2017 FINAL  Final  Culture, respiratory (NON-Expectorated)     Status: None   Collection Time: 09/20/17  2:00 AM  Result Value Ref Range Status   Specimen Description SPUTUM  Final   Special Requests NONE Reflexed from [id number]  Final   Gram Stain   Final    FEW WBC PRESENT, PREDOMINANTLY PMN ABUNDANT YEAST WITH PSEUDOHYPHAE RARE GRAM POSITIVE COCCI RARE GRAM POSITIVE RODS Performed at [facility], [place] [address], [place], [place] [address]    Culture MODERATE CANDIDA TROPICALIS  Final   Report Status 09/23/2017 FINAL  Final     Studies: No results found.  Scheduled Meds: . aspirin EC  81 mg Oral Daily  . dorzolamide-timolol  1 drop Right Eye BID  . enoxaparin (LOVENOX) injection  30 mg Subcutaneous Q24H  . famotidine  20 mg Oral Daily  . guaiFENesin  5 mL Oral Q6H  . ipratropium-albuterol  3 mL Nebulization TID  . lactose free nutrition  237 mL Oral TID WC  . methylPREDNISolone (SOLU-MEDROL) injection  60 mg Intravenous Q12H  . pantoprazole  40 mg Oral Daily  . polyethylene glycol  17 g Oral Daily  . senna  1 tablet Oral BID  . sertraline  25 mg Oral Daily  . tamsulosin  0.4 mg Oral Daily    Continuous Infusions: . ampicillin-sulbactam (UNASYN) IV 1.5 g (09/23/17 1323)     Time spent: 51mins I have personally reviewed and interpreted on  09/23/2017 daily labs,  as discussed above under data review session and assessment and plans.  I reviewed all nursing notes, pharmacy notes,  vitals, pertinent old records  I have discussed plan of care as described above with RN , patient  on 09/23/2017   Liane Tribbey MD, PhD  [facility] Pager [phone]. If 7PM-7AM, please contact night-coverage at [email or web address],  password [facility] 09/23/2017, 5:31 PM  LOS: 7 days

## 2017-09-23 NOTE — Progress Notes (Signed)
Pt pulled out foley catheter this am and refused another one.  She had no evidence of trauma and reported no pain at this time.  Will continue to monitor.

## 2017-09-24 DIAGNOSIS — L8990 Pressure ulcer of unspecified site, unspecified stage: Secondary | ICD-10-CM | POA: Insufficient documentation

## 2017-09-24 LAB — BASIC METABOLIC PANEL
ANION GAP: 11 (ref 5–15)
BUN: 22 mg/dL — ABNORMAL HIGH (ref 6–20)
CALCIUM: 8.2 mg/dL — AB (ref 8.9–10.3)
CO2: 25 mmol/L (ref 22–32)
Chloride: 99 mmol/L — ABNORMAL LOW (ref 101–111)
Creatinine, Ser: 0.47 mg/dL (ref 0.44–1.00)
GLUCOSE: 117 mg/dL — AB (ref 65–99)
POTASSIUM: 3.1 mmol/L — AB (ref 3.5–5.1)
SODIUM: 135 mmol/L (ref 135–145)

## 2017-09-24 LAB — CBC
HCT: 38.2 % (ref 36.0–46.0)
Hemoglobin: 12.9 g/dL (ref 12.0–15.0)
MCH: 30.4 pg (ref 26.0–34.0)
MCHC: 33.8 g/dL (ref 30.0–36.0)
MCV: 90.1 fL (ref 78.0–100.0)
PLATELETS: 458 10*3/uL — AB (ref 150–400)
RBC: 4.24 MIL/uL (ref 3.87–5.11)
RDW: 13.3 % (ref 11.5–15.5)
WBC: 19.3 10*3/uL — AB (ref 4.0–10.5)

## 2017-09-24 MED ORDER — OXYCODONE HCL 5 MG PO TABS: 5.0000 mg | ORAL_TABLET | ORAL | 0 refills | Status: AC | PRN

## 2017-09-24 MED ORDER — DEXTROSE-NACL 5-0.9 % IV SOLN
INTRAVENOUS | Status: DC
  Administered 2017-09-24: 21:00:00 via INTRAVENOUS

## 2017-09-24 MED ORDER — SACCHAROMYCES BOULARDII 250 MG PO CAPS
250.0000 mg | ORAL_CAPSULE | Freq: Once | ORAL | Status: DC
  Filled 2017-09-24: qty 1

## 2017-09-24 MED ORDER — POTASSIUM CHLORIDE 10 MEQ/100ML IV SOLN
10.0000 meq | INTRAVENOUS | Status: AC
  Administered 2017-09-24 (×4): 10 meq via INTRAVENOUS
  Filled 2017-09-24 (×4): qty 100

## 2017-09-24 MED ORDER — SODIUM CHLORIDE 0.9 % IV BOLUS (SEPSIS)
500.0000 mL | Freq: Once | INTRAVENOUS | Status: AC
  Administered 2017-09-24: 500 mL via INTRAVENOUS

## 2017-09-24 NOTE — Consult Note (Signed)
  Patient well known to me, status post laryngectomy about 20 years ago. She has been a very successful TEP user for many years. She normally goes to [facility] for TEP changes.   She was hospitalized with pneumonia and what sounds like advanced lung cancer. She has been unable to phonate for a few months.   On exam, the stoma looks healthy and clear. The old TEP was removed. Its covered in some multi growth. I attempted to replace a fresh TEP but had some difficulty inserting it. Ultimately the new one malfunctioned and appeared to be broken. I was able to place the old one back in to prevent leakage. She was not able to phonate still. She was able to phonate some when the TEP was out.  During this procedure the old TEP inadvertently fell down the tracheal stoma. Fiberoptic examination was performed through the tracheal stoma and I was able to identify it at the carina. She ultimately coughed it back up again. She was in no distress.  Unfortunately, was not successful in restoring phonation. I'm going to recommend that upon discharge she and her niece make an appointment to go back to [facility] to have them replace a fresh prosthesis. I will beavailable for any other issues that arise.

## 2017-09-24 NOTE — Progress Notes (Signed)
MD Doyle Askew asked this RN to contact [facility] regarding leaking of oral fluids around tracheostomy stoma. Dr. [name] to return call.

## 2017-09-24 NOTE — Progress Notes (Addendum)
PROGRESS NOTE  Kim Hardy:[id number] DOB: Sep 12, 1931 DOA: 09/16/2017   PCP: [name], MD  Brief Summary:  H/o Lung cancer, laryngeal cancer patient, tracheostomy status. Presented with progressive n/v, weakness, poor by mouth intake,  SOB, increased sputum purulence. Chest CT- pneumonia, increasing lung mass. Oncology/palliative care consulted, family want comfort focused care. ENT Dr [name] to switch out TEP, hopfully on 9/26, then patient can be discharged to SNF with palliative care.  Assessment/Plan:  Lobar Pneumonia/acute hypoxia respiratory failure, likely from mucus plugging, aspiration pneumonia -She presented with sob, tachypnea, sinus tachycardia, increase sputum production through chronic tracheostomy  -CTA on admission no PE, but with possible pneumonia -pneumonia possible due to postobstructive+/- Aspiration (with chronic n/v) -She received deep suction through stoma ( with thick mucus suctioned out), she could not  Tolerate chest PT due to pain -she received rocephin/zithro in the ED, abx changed to vanc/zosyn on admission, blood culture no growth, sputum culture + yeast, d/c vanc on 9/20,  zosyn changed to unasyn on 9/22 to cover possible aspiration pneumonia -due to wheezing, she is started on steroids, schedule nebs, additional dose of lasix given on 9/22,  -no wheezing this AM, stable resp status  -continue to taper down steroids, WBC is up and likely steroid induced  - TEP to be replaced by Dr. [name]  TEP malfunction: aphonia and likely cause of aspiration pneumonia, severe PCM - apparently patient has been having issues with her transesophageal puncture prosthesis for a while, this might leads to recurrent aspiration pneumonia. - on 9/21, Case discussed by Dr. Erlinda Hong with ENT Dr [name] who has known this patient for the last 51yrs.  Dr [name] plan to have the device changed .  Hyponatremia: Sodium 127 on admission -Likely combination of dehydration and SIADH  in the setting on pneumonia -resolved  Hypokalemia/Hypomagnesemia:  -K still low, supplement, BMP In AM  N/V: report chronic but progressively got worse -no vomiting so far   Urine retention of 700cc with lower abdominal discomfort on admission ,  -required in and out foleyx2 -avoid constipation, she is started on flomax, foley placed but pt pulled out foley 9/25 -observe off foley  Lung mass -hx of lung cancer, appears to be increasing in size compared to CT scan 2016  -oncology Dr [name] consulted, input appreciated Per Dr [name]" "Would not recommend any biopsies for tissue diagnosis at this time . -Patient has very poor performance status and would not be a candidate for palliative chemotherapy even if there were recurrent lung cancer . -Would recommend defining detailed goals of care with the patient's healthcare proxy and family . -She has a lot of competing health priorities which would need to be taken into account to determine need for additional imaging and considerations for biopsy or treatment . -If the patient's goals of care to dictate follow-up for radiographic monitoring of lung cancer would recommend she follow-up with Dr. [name] in 8-12 weeks after treatment of recurrent pneumonia with consideration of repeat CT of the chest or if more data is needed at that point consideration of PET/CT scan "  -Family/HCPOA do not wish to go through biopsy, chemo or radiation. want comfort focused care.  H/o laryngeal cancer, detail unknown ENT Dr [name] consulted.   Weight loss , Severe malnutrition in context of chronic illness Body mass index is 16.21 kg/m. Nutrition input appreciated  FTT: PT eval,  snf placement   Code Status: DNR   Family Communication: no family at bedside, left  message for niece on her cell phone to call me back  Disposition Plan:  SNF in AM   Consultants:  Oncology  ENT  Palliative care  Speech therapy  Physical therapy  Social  worker  Procedures:  Plan to have TEP replacement by ENT  Antibiotics:  Rocephin/zithro x1 in the ED  Vanc from admission to 9/20  zosyn from admission to 9/22  unasyn from 9/22   Objective: BP 93/75 (BP Location: Left Arm)   Pulse 66   Temp 98 F (36.7 C) (Oral)   Resp 20   Ht 5' (1.524 m)   Wt 37.6 kg (83 lb)   SpO2 91%   BMI 16.21 kg/m   Intake/Output Summary (Last 24 hours) at 09/24/17 1215 Last data filed at 09/24/17 1102  Gross per 24 hour  Intake              450 ml  Output                0 ml  Net              450 ml   Filed Weights   09/16/17 1338  Weight: 37.6 kg (83 lb)   Physical Exam  Constitutional: Appears calm, AD CVS: RRR, S1/S2 +, no gallops, no carotid bruit.  Pulmonary: Effort and breath sounds diminished   Abdominal: Soft. BS +,  no distension, tenderness, rebound or guarding.   Data Reviewed: Basic Metabolic Panel:  Recent Labs Lab 09/18/17 0625 09/19/17 0549 09/20/17 0609 09/21/17 0600 09/22/17 0644 09/23/17 0634 09/24/17 0440  NA 134* 132* 135 137 132* 134* 135  K 4.0 4.0 3.3* 3.2* 4.4 3.5 3.1*  CL 101 104 101 100* 100* 98* 99*  CO2 16* 16* 24 23 24 25 25   GLUCOSE 94 83 108* 143* 148* 146* 117*  BUN 12 21* 21* 26* 28* 24* 22*  CREATININE 0.55 0.67 0.56 0.86 0.51 0.45 0.47  CALCIUM 8.6* 8.0* 8.1* 8.0* 8.2* 8.0* 8.2*  MG 1.3* 2.2 1.7 1.5* 2.2  --   --    Liver Function Tests:  Recent Labs Lab 09/18/17 0625 09/22/17 0644  AST 35 45*  ALT 19 28  ALKPHOS 63 52  BILITOT 1.7* 0.7  PROT 6.8 5.9*  ALBUMIN 3.1* 2.3*   CBC:  Recent Labs Lab 09/20/17 0609 09/21/17 0600 09/22/17 0644 09/23/17 0634 09/24/17 0440  WBC 15.3* 13.3* 11.7* 18.3* 19.3*  NEUTROABS 12.9* 12.3* 10.5*  --   --   HGB 12.0 10.9* 11.0* 12.8 12.9  HCT 35.0* 32.0* 32.8* 37.4 38.2  MCV 92.1 92.2 91.6 91.4 90.1  PLT 322 325 355 438* 458*   BNP (last 3 results)  Recent Labs  09/16/17 1442  BNP 54.6    Recent Results (from the past 240  hour(s))  Culture, Urine     Status: Abnormal   Collection Time: 09/16/17  2:23 PM  Result Value Ref Range Status   Specimen Description URINE, RANDOM  Final   Special Requests NONE  Final   Culture MULTIPLE SPECIES PRESENT, SUGGEST RECOLLECTION (A)  Final   Report Status 09/18/2017 FINAL  Final  Culture, blood (Routine X 2) w Reflex to ID Panel     Status: None   Collection Time: 09/16/17  4:06 PM  Result Value Ref Range Status   Specimen Description BLOOD RIGHT FOREARM  Final   Special Requests   Final    BOTTLES DRAWN AEROBIC AND ANAEROBIC Blood Culture adequate volume   Culture  Final    NO GROWTH 5 DAYS Performed at [facility], [place] [address] [place], [place] [address]    Report Status 09/21/2017 FINAL  Final  Culture, blood (Routine X 2) w Reflex to ID Panel     Status: None   Collection Time: 09/16/17  4:18 PM  Result Value Ref Range Status   Specimen Description BLOOD LEFT ANTECUBITAL  Final   Special Requests   Final    BOTTLES DRAWN AEROBIC AND ANAEROBIC Blood Culture adequate volume   Culture   Final    NO GROWTH 5 DAYS Performed at [facility], [place] [address] [place], [place] [address]    Report Status 09/21/2017 FINAL  Final  MRSA PCR Screening     Status: None   Collection Time: 09/17/17  5:25 AM  Result Value Ref Range Status   MRSA by PCR NEGATIVE NEGATIVE Final    Comment:        The GeneXpert MRSA Assay (FDA approved for NASAL specimens only), is one component of a comprehensive MRSA colonization surveillance program. It is not intended to diagnose MRSA infection nor to guide or monitor treatment for MRSA infections.   Culture, expectorated sputum-assessment     Status: None   Collection Time: 09/17/17  9:07 AM  Result Value Ref Range Status   Specimen Description SPUTUM  Final   Special Requests NONE  Final   Sputum evaluation THIS SPECIMEN IS ACCEPTABLE FOR SPUTUM CULTURE  Final   Report Status 09/17/2017 FINAL  Final  Culture,  respiratory (NON-Expectorated)     Status: None   Collection Time: 09/17/17  9:07 AM  Result Value Ref Range Status   Specimen Description SPUTUM  Final   Special Requests NONE Reflexed from [id number]  Final   Gram Stain   Final    DEGENERATED CELLULAR MATERIAL PRESENT NO WBC IDENTIFIED NO ORGANISMS SEEN    Culture   Final    NO GROWTH 2 DAYS Performed at [facility], [address] [place], [place] [address]    Report Status 09/19/2017 FINAL  Final  Culture, expectorated sputum-assessment     Status: None   Collection Time: 09/20/17  2:00 AM  Result Value Ref Range Status   Specimen Description SPUTUM  Final   Special Requests NONE  Final   Sputum evaluation   Final    THIS SPECIMEN IS ACCEPTABLE FOR SPUTUM CULTURE Performed at [facility], [place] [address] [place], [place] [address]    Report Status 09/20/2017 FINAL  Final  Culture, respiratory (NON-Expectorated)     Status: None   Collection Time: 09/20/17  2:00 AM  Result Value Ref Range Status   Specimen Description SPUTUM  Final   Special Requests NONE Reflexed from [id number]  Final   Gram Stain   Final    FEW WBC PRESENT, PREDOMINANTLY PMN ABUNDANT YEAST WITH PSEUDOHYPHAE RARE GRAM POSITIVE COCCI RARE GRAM POSITIVE RODS Performed at [facility], [place] [address] [place], [place] [address]    Culture MODERATE CANDIDA TROPICALIS  Final   Report Status 09/23/2017 FINAL  Final     Studies: No results found.  Scheduled Meds: . aspirin EC  81 mg Oral Daily  . dorzolamide-timolol  1 drop Right Eye BID  . enoxaparin (LOVENOX) injection  30 mg Subcutaneous Q24H  . famotidine  20 mg Oral Daily  . guaiFENesin  5 mL Oral Q6H  . ipratropium-albuterol  3 mL Nebulization TID  .  lactose free nutrition  237 mL Oral TID WC  . pantoprazole  40 mg Oral Daily  . polyethylene glycol  17 g Oral Daily  . predniSONE  60 mg Oral Q breakfast  . senna  1 tablet Oral BID  . sertraline  25 mg Oral Daily  . tamsulosin   0.4 mg Oral Daily    Continuous Infusions: . ampicillin-sulbactam (UNASYN) IV 1.5 g (09/24/17 0541)    Time spent: 25 mins   Faye Ramsay MD [facility] Pager [phone]. If 7PM-7AM, please contact night-coverage at [email or web address], password [facility] 09/24/2017, 12:15 PM  LOS: 8 days

## 2017-09-24 NOTE — Progress Notes (Signed)
PT Cancellation Note  Patient Details Name: NILANI HUGILL MRN: [id number] DOB: 05-Oct-1931   Cancelled Treatment:    Reason Eval/Treat Not Completed: Pain limiting ability to participate, patientt having leg cramps. Check back another time as schedule allows.   Marcelino Freestone PT [phone]  09/24/2017, 12:46 PM

## 2017-09-24 NOTE — Progress Notes (Signed)
Patient was provided boost drink after MD gave order for full liquid diet. Upon assessment after drinking boost this RN notes that the liquid the patient is drinking appears to be leaving out around the stoma. Patient made NPO again and MD notified.

## 2017-09-24 NOTE — Progress Notes (Signed)
   09/24/17 1500  Clinical Encounter Type  Visited With Patient  Visit Type Follow-up  Spiritual Encounters  Spiritual Needs Emotional;Prayer   Followed up from a visit with the patient on Monday.  She seemed peaceful and glad I came back to see her.  She motioned she is ready to get out of the hospital.  We had prayer together and she responded with a big hand squeeze.  Will follow as needed. Chaplain Katherene Ponto

## 2017-09-24 NOTE — Progress Notes (Signed)
Nutrition Follow-up  DOCUMENTATION CODES:   Underweight, Severe malnutrition in context of chronic illness  INTERVENTION:   Boost Plus chocolate TID- Each supplement provides 360kcal and 14g protein.    NUTRITION DIAGNOSIS:   Malnutrition (Severe) related to chronic illness (lung/laryngeal cancer) as evidenced by energy intake < or equal to 50% for > or equal to 1 month, severe depletion of body fat, severe depletion of muscle mass.  Ongoing  GOAL:   Patient will meet greater than or equal to 90% of their needs  Not Meeting  MONITOR:   PO intake, Supplement acceptance, Labs, Weight trends  REASON FOR ASSESSMENT:   Malnutrition Screening Tool    ASSESSMENT:   Pt with PMH of lung/layngeal cancer s/p tracheostomy and depression. Presents to ED with complaints of weakness, shortness of breath, nausea, and poor PO intake for months duration. Admitted for  HCAP and lung mass (found 2016) that looks to be increasing in size this hospital stay.    Pt awaiting TEP delivery/replacment. Excepted to discharge to SNF post procedure. MD notes pt may need hospice services soon. Pt currently NPO for procedure. Meal completions charted as 10% average for last 8 meals. No new wts have been obtain. Will request for re weigh as pt's intake has been poor. Suspect poor PO intake is the result for pt's ongoing tracheostomy issues. Pt shows to be readjusting the trach upon visit. RD to continue to encourage PO intake and monitor supplement acceptance.   Medications reviewed and include: Prednisone, IV abx, KCl Labs reviewed: K 3.1 (L) Cl 99 (L) BUN 22(H) AST 45 (H)   Diet Order:  Diet NPO time specified  Skin:  Reviewed, no issues  Last BM:  PTA  Height:   Ht Readings from Last 1 Encounters:  09/16/17 5' (1.524 m)    Weight:   Wt Readings from Last 1 Encounters:  09/16/17 83 lb (37.6 kg)    Ideal Body Weight:  45.5 kg  BMI:  Body mass index is 16.21 kg/m.  Estimated Nutritional  Needs:   Kcal:  1400-1600 (31-35 kcal/kg IBW)  Protein:  80-90 grams (1.8-2 g/kg IBW)  Fluid:  >1.4 L/day  EDUCATION NEEDS:   No education needs identified at this time  [place], LDN Clinical Nutrition Pager # - [phone]

## 2017-09-24 NOTE — Progress Notes (Signed)
Rt check the Pt's O2 sat and it was 70% on RA. RT asked the Pt why she wasn't on the trach collar and she said the doctor put her on RA. RT placed the Pt on 30% ATC and he sats were 93%.  RT informed the nurse. RT will continue to monitor.

## 2017-09-25 ENCOUNTER — Encounter (HOSPITAL_COMMUNITY): Payer: Self-pay

## 2017-09-25 LAB — CBC
HEMATOCRIT: 38.2 % (ref 36.0–46.0)
HEMOGLOBIN: 12.7 g/dL (ref 12.0–15.0)
MCH: 30.3 pg (ref 26.0–34.0)
MCHC: 33.2 g/dL (ref 30.0–36.0)
MCV: 91.2 fL (ref 78.0–100.0)
Platelets: 417 10*3/uL — ABNORMAL HIGH (ref 150–400)
RBC: 4.19 MIL/uL (ref 3.87–5.11)
RDW: 13.5 % (ref 11.5–15.5)
WBC: 13.2 10*3/uL — AB (ref 4.0–10.5)

## 2017-09-25 LAB — BASIC METABOLIC PANEL
ANION GAP: 10 (ref 5–15)
BUN: 21 mg/dL — ABNORMAL HIGH (ref 6–20)
CALCIUM: 7.9 mg/dL — AB (ref 8.9–10.3)
CHLORIDE: 104 mmol/L (ref 101–111)
CO2: 24 mmol/L (ref 22–32)
Creatinine, Ser: 0.43 mg/dL — ABNORMAL LOW (ref 0.44–1.00)
GFR calc Af Amer: >60 mL/min (ref >60)
GFR calc non Af Amer: >60 mL/min (ref >60)
GLUCOSE: 128 mg/dL — AB (ref 65–99)
Potassium: 3.5 mmol/L (ref 3.5–5.1)
Sodium: 138 mmol/L (ref 135–145)

## 2017-09-25 MED ORDER — SODIUM CHLORIDE 0.9 % IV BOLUS (SEPSIS)
500.0000 mL | Freq: Once | INTRAVENOUS | Status: AC
  Administered 2017-09-25: 500 mL via INTRAVENOUS

## 2017-09-25 MED ORDER — BOOST PLUS PO LIQD: 237.0000 mL | Freq: Three times a day (TID) | ORAL | 0 refills | Status: AC

## 2017-09-25 MED ORDER — ALBUTEROL SULFATE (2.5 MG/3ML) 0.083% IN NEBU: 2.5000 mg | INHALATION_SOLUTION | Freq: Four times a day (QID) | RESPIRATORY_TRACT | 12 refills | Status: AC | PRN

## 2017-09-25 MED ORDER — CLONAZEPAM 0.125 MG PO TBDP: 0.1250 mg | ORAL_TABLET | Freq: Two times a day (BID) | ORAL | 0 refills | Status: AC | PRN

## 2017-09-25 MED ORDER — TAMSULOSIN HCL 0.4 MG PO CAPS: 0.4000 mg | ORAL_CAPSULE | Freq: Every day | ORAL | 0 refills | Status: AC

## 2017-09-25 MED ORDER — PREDNISONE 10 MG PO TABS: ORAL_TABLET | ORAL | 0 refills | Status: AC

## 2017-09-25 MED ORDER — AMOXICILLIN-POT CLAVULANATE 500-125 MG PO TABS: 1.0000 | ORAL_TABLET | Freq: Three times a day (TID) | ORAL | 0 refills | Status: AC

## 2017-09-25 MED ORDER — IPRATROPIUM-ALBUTEROL 0.5-2.5 (3) MG/3ML IN SOLN: 3.0000 mL | Freq: Three times a day (TID) | RESPIRATORY_TRACT | 1 refills | Status: AC

## 2017-09-25 NOTE — Discharge Summary (Addendum)
Physician Discharge Summary  Kim Hardy OXB:[id number] DOB: 17-Jan-1931 DOA: 09/16/2017  PCP: [name], MD  Admit date: 09/16/2017 Discharge date: 09/25/2017  Recommendations for Outpatient Follow-up:  1. Please note that ENT doctor Dr. [name] has seen patient in consultation, patient is status post laryngectomy about 20 years ago and has been a very successful TEP user for many years, patient goes to [facility] for TEP changes. Please note Dr. [name] attempted to remove old TEP and attempted to replace a fresh TEP but had difficulty inserting it. Old TEP was placed back in to prevent leakage, unfortunately was not successful in restoring phonation. 2. Plan to transfer to [facility] hospital due to TEP malfunction and recurrent aspiration PNA   Discharge Diagnoses:  Principal Problem:   HCAP (healthcare-associated pneumonia) Active Problems:   laryngeal ca   Lung cancer ([place])   Protein-calorie malnutrition, severe   Generalized weakness   Encounter for palliative care   Goals of care, counseling/discussion  Discharge Condition: Stable  Diet recommendation: As tolerated   History of present illness:  H/o Lung cancer, laryngeal cancer patient, tracheostomy status. Presented with progressive n/v, weakness, poor by mouth intake,  SOB, increased sputum purulence. Chest CT- pneumonia, increasing lung mass. Oncology/palliative care consulted, family want comfort focused care.  Assessment/Plan:  Lobar Pneumonia/acute hypoxia respiratory failure, likely from mucus plugging, aspiration pneumonia due to malfunction of the tracheostomy tube -She presented with sob, tachypnea, sinus tachycardia, increase sputum production through chronic tracheostomy  -CTA on admission no PE, but with possible pneumonia -pneumonia possible due to postobstructive+/- Aspiration (with chronic n/v) in the stting of TEP malfunction, leakage  -She received deep suction through stoma ( with thick  mucus suctioned out), she could not  Tolerate chest PT due to pain -she received rocephin/zithro in the ED, abx changed to vanc/zosyn on admission, blood culture no growth, sputum culture + yeast, d/c vanc on 9/20,  zosyn changed to unasyn on 9/22 to cover aspiration pneumonia -due to wheezing, she is started on steroids, schedule nebs, additional dose of lasix given on 9/22,  -no wheezing this AM, stable resp status but breath sounds are rather course with diffuse rhonchi  - changed Unasyn to Augmentin today to complete therapy from 4 more days (this will complete total 10 days therapy)  TEP malfunction: aphonia and likely cause of aspiration pneumonia, severe PCM - apparently patient has been having issues with her transesophageal puncture prosthesis for a while, this might leads to recurrent aspiration pneumonia. - on 9/21, Case discussed by Dr. Erlinda Hong with ENT Dr [name] who has known this patient for the last 59yrs.   - unsuccessful attempt to change TEP, transfer to [facility] for further management   Hyponatremia: Sodium 127 on admission - Likely combination of dehydration and SIADH in the setting on pneumonia - resolved   L buttock ecchymoses - no skin damage, no pressure injury   Hypokalemia/Hypomagnesemia:  - electrolytes supplemented   N/V: report chronic but progressively got worse - now resolved   Urine retention of 700cc with lower abdominal discomfort on admission ,  - required in and out foleyx2 - avoid constipation, she is started on flomax, foley placed but pt pulled out foley 9/25 - We have observed off Foley, Flomax started  Lung mass -hx of lung cancer, appears to be increasing in size compared to CT scan 2016  -oncology Dr [name] consulted, input appreciated Per Dr [name]" "Would not recommend any biopsies for tissue diagnosis at this  time . -Patient has very poor performance status and would not be a candidate for palliative chemotherapy even if there were recurrent  lung cancer . -Would recommend defining detailed goals of care with the patient's healthcare proxy and family . -She has a lot of competing health priorities which would need to be taken into account to determine need for additional imaging and considerations for biopsy or treatment . -If the patient's goals of care to dictate follow-up for radiographic monitoring of lung cancer would recommend she follow-up with Dr. [name] in 8-12 weeks after treatment of recurrent pneumonia with consideration of repeat CT of the chest or if more data is needed at that point consideration of PET/CT scan "  -Family/HCPOA do not wish to go through biopsy, chemo or radiation. want comfort focused care.  H/o laryngeal cancer, detail unknown - s/p laryngectomy over 20 years ago - has TEP but now malfunctioned - transfer to [facility] as noted above   Weight loss , Severe malnutrition in context of chronic illness Body mass index is 16.21 kg/m. Appreciated nutritionist input  FTT:  placement to skilled nursing facility once TEP fixed   Code Status: DNR  Family Communication:  no family at bedside, spoke with niece over the phone  Disposition Plan:  transfer to [facility]   Consultants:  Oncology  ENT  Palliative care  Speech therapy  Physical therapy  Social worker  Procedures:  Plan to have TEP replacement by ENT  Antibiotics:  Rocephin/zithro x1 in the ED  Vanc from admission to 9/20  zosyn from admission to 9/22  unasyn from 9/22  Procedures/Studies: Dg Chest 2 View  Result Date: 09/16/2017 CLINICAL DATA:  Shortness of breath, cough, lung cancer EXAM: CHEST  2 VIEW COMPARISON:  11/30/2015, 04/17/2015 FINDINGS: Normal heart size and vascularity. No effusion or pneumothorax. Ill-defined right lower lobe masslike opacity appears grossly stable by plain radiography. Vascular congestion noted with increased perihilar and bibasilar interstitial opacities concerning for chronic  interstitial lung disease versus edema. Normal heart size. Atherosclerosis of the aorta. Remote posterior right rib fractures. Atherosclerosis noted of the aorta. Degenerative changes of the spine. IMPRESSION: Grossly stable right lower lobe masslike opacity correlates with the previous right lung mass by CT. Increased hilar and bibasilar interstitial changes may represent chronic interstitial lung disease versus edema. Atherosclerosis Electronically Signed   By: Jerilynn Mages.  Shick M.D.   On: 09/16/2017 16:06   Ct Angio Chest Pe W Or Wo Contrast  Result Date: 09/16/2017 CLINICAL DATA:  Right-sided lung cancer with increased weakness for several days. Multiple falls. Tachycardic. Possible congestive heart failure. EXAM: CT ANGIOGRAPHY CHEST WITH CONTRAST TECHNIQUE: Multidetector CT imaging of the chest was performed using the standard protocol during bolus administration of intravenous contrast. Multiplanar CT image reconstructions and MIPs were obtained to evaluate the vascular anatomy. CONTRAST:  49 cc of Isovue [id number] COMPARISON:  09/16/2017 chest radiograph. Most recent CT of 11/30/2015. FINDINGS: Cardiovascular: The quality of this exam for evaluation of pulmonary embolism is moderate to good. Suboptimal opacification of pulmonary vascular branches in the right apex is favored to be due to bolus timing. These are difficult to follow but may be venous. Otherwise, no pulmonary embolism identified Advanced aortic and branch vessel atherosclerosis. Mild cardiomegaly with trace pericardial fluid. Multivessel coronary artery atherosclerosis. Mediastinum/Nodes: Tracheostomy. Mediastinal edema is chronic and likely radiation induced. No well-defined mediastinal adenopathy. No hilar adenopathy. Tiny hiatal hernia.  Mildly dilated upper esophagus is nonspecific. Lungs/Pleura: Similar small right pleural effusion. New trace  left pleural fluid. Moderate centrilobular emphysema. Left apical mild ground-glass opacity on image  12/series 11 new. There is also dependent mild airspace disease within both upper lobes. There is favored to represent subsegmental atelectasis and is unchanged on the left. Irregular left upper lobe 7 mm density on image 33/series 11 is new. Mild left lower lobe airspace disease, some of which has a nodular component. Example images 45 and 47/series 11. The airspace disease is new, with the nodularity present back on 07/01/2017. Slight increasing and lower lobe consolidation, and 2016 including image 45/series 11. Right lower lobe consolidation, small surrounding ground-glass opacity and architectural distortion are felt to be similar to July of this year abdominal study. Upper Abdomen: Old granulomatous disease in the liver. Normal imaged portions of the spleen, pancreas, gallbladder, adrenal glands, kidneys. Musculoskeletal: Osteopenia. Nonacute posttraumatic or postsurgical deformities without posterolateral right ribs. Review of the MIP images confirms the above findings. IMPRESSION: 1.  No evidence of pulmonary embolism. 2. Right lower lobe consolidation is increased since 11/30/2015 but felt to be similar to 07/01/2017 abdominal study. This is most likely radiation induced. Given progression since the remote exam, locally recurrent disease cannot be entirely excluded but is felt unlikely. 3. Left lower lobe nodularity is similar back to July. Concurrent superimposed patchy airspace and ground-glass opacity is suspicious for infection. Left upper lobe ground-glass and soft tissue nodularity could also be infectious or neoplastic. Consider antibiotic therapy and CT follow-up at 6-12 weeks. 4. Coronary artery atherosclerosis. Aortic Atherosclerosis (ICD10-I70.0). 5. Similar small right pleural effusion with new left pleural fluid since 2016. 6.  Emphysema (ICD10-J43.9). Electronically Signed   By: Abigail Miyamoto M.D.   On: 09/16/2017 17:47   Dg Chest Port 1 View  Result Date: 09/19/2017 CLINICAL DATA:   Shortness of breath EXAM: PORTABLE CHEST 1 VIEW COMPARISON:  September 16, 2017 FINDINGS: A right lower lobe masslike opacities again identified consistent with the patient's known malignancy. There is diffuse interstitial opacity today, new in the interval. The heart, hila, and mediastinum are unchanged. No pneumothorax. IMPRESSION: 1. New diffuse interstitial opacities suggest edema or atypical infection. 2. The patient's known mass in the medial right lower lobe is again identified, partially obscured by the diffuse interstitial opacities. Electronically Signed   By: Dorise Bullion III M.D   On: 09/19/2017 10:07    Discharge Exam: Vitals:   09/25/17 0803 09/25/17 0808  BP:    Pulse:    Resp:    Temp:    SpO2: (!) 77% 93%   Vitals:   09/24/17 2308 09/25/17 0535 09/25/17 0803 09/25/17 0808  BP: (!) 78/53 (!) 98/55    Pulse:  (!) 101    Resp:  18    Temp:  98.4 F (36.9 C)    TempSrc:  Oral    SpO2:  95% (!) 77% 93%  Weight:      Height:        General: Pt is alert, NAD Cardiovascular: Regular rate and rhythm, S1/S2 +, no rubs, no gallops Respiratory: Course breath sounds bilaterally with rhonchi  Abdominal: Soft, non tender, non distended, bowel sounds +, no guarding Extremities: no edema, no cyanosis  Discharge Instructions  Discharge Instructions    Diet - low sodium heart healthy    Complete by:  As directed    Increase activity slowly    Complete by:  As directed      Allergies as of 09/25/2017      Reactions   Anesthetics, Amide Nausea  And Vomiting   Boniva [ibandronate Sodium] Other (See Comments)   tremors   Ciprofloxacin    "I get very sick"   Levofloxacin    Pt states she is unable to take this.    Metronidazole    Pt states she is unable to tolerate.    Statins Nausea And Vomiting   Sulfa Antibiotics Hives      Medication List    STOP taking these medications   dimenhyDRINATE 50 MG tablet Commonly known as:  DRAMAMINE   ibuprofen 200 MG  tablet Commonly known as:  ADVIL,MOTRIN   megestrol 40 MG/ML suspension Commonly known as:  MEGACE   OVER THE COUNTER MEDICATION     TAKE these medications   acetaminophen 160 MG/5ML liquid Commonly known as:  TYLENOL Take 15.6 mLs (500 mg total) by mouth every 4 (four) hours as needed for fever.   albuterol (2.5 MG/3ML) 0.083% nebulizer solution Commonly known as:  PROVENTIL Take 3 mLs (2.5 mg total) by nebulization every 6 (six) hours as needed for wheezing or shortness of breath.   amoxicillin-clavulanate 500-125 MG tablet Commonly known as:  AUGMENTIN Take 1 tablet (500 mg total) by mouth 3 (three) times daily.   aspirin EC 81 MG tablet Take 1 tablet (81 mg total) by mouth daily.   clonazepam 0.125 MG disintegrating tablet Commonly known as:  KLONOPIN Take 1 tablet (0.125 mg total) by mouth 2 (two) times daily as needed for seizure.   dorzolamide-timolol 22.3-6.8 MG/ML ophthalmic solution Commonly known as:  COSOPT INSTILL 1 DROP INTO RIGHT EYE TWICE A DAY   famotidine 20 MG tablet Commonly known as:  PEPCID Take 1 tablet (20 mg total) by mouth daily.   guaiFENesin 100 MG/5ML Soln Commonly known as:  ROBITUSSIN Take 5 mLs (100 mg total) by mouth every 6 (six) hours.   ipratropium-albuterol 0.5-2.5 (3) MG/3ML Soln Commonly known as:  DUONEB Take 3 mLs by nebulization 3 (three) times daily.   lactose free nutrition Liqd Take 237 mLs by mouth 3 (three) times daily with meals.   ondansetron 4 MG tablet Commonly known as:  ZOFRAN Take 1 tablet (4 mg total) by mouth every 8 (eight) hours as needed for nausea or vomiting.   oxyCODONE 5 MG immediate release tablet Commonly known as:  Oxy IR/ROXICODONE Take 1 tablet (5 mg total) by mouth every 3 (three) hours as needed for moderate pain.   pantoprazole 40 MG tablet Commonly known as:  PROTONIX Take 1 tablet (40 mg total) by mouth daily.   predniSONE 10 MG tablet Commonly known as:  DELTASONE Take 50 mg tablet  on 09/26/2017 and taper down by 10 mg daily until completed   promethazine 12.5 MG tablet Commonly known as:  PHENERGAN Take 1 tablet (12.5 mg total) by mouth every 8 (eight) hours as needed for nausea or vomiting.   sertraline 25 MG tablet Commonly known as:  ZOLOFT Take 1 tablet (25 mg total) by mouth daily.   sodium chloride HYPERTONIC 3 % nebulizer solution Take 4 mLs by nebulization 2 (two) times daily.   tamsulosin 0.4 MG Caps capsule Commonly known as:  FLOMAX Take 1 capsule (0.4 mg total) by mouth daily.            Discharge Care Instructions        Start     Ordered   09/26/17 0000  tamsulosin (FLOMAX) 0.4 MG CAPS capsule  Daily     09/25/17 1157   09/25/17 0000  albuterol (  PROVENTIL) (2.5 MG/3ML) 0.083% nebulizer solution  Every 6 hours PRN     09/25/17 1157   09/25/17 0000  clonazepam (KLONOPIN) 0.125 MG disintegrating tablet  2 times daily PRN     09/25/17 1157   09/25/17 0000  ipratropium-albuterol (DUONEB) 0.5-2.5 (3) MG/3ML SOLN  3 times daily     09/25/17 1157   09/25/17 0000  lactose free nutrition (BOOST PLUS) LIQD  3 times daily with meals     09/25/17 1157   09/25/17 0000  predniSONE (DELTASONE) 10 MG tablet     09/25/17 1157   09/25/17 0000  Increase activity slowly     09/25/17 1157   09/25/17 0000  Diet - low sodium heart healthy     09/25/17 1157   09/25/17 0000  amoxicillin-clavulanate (AUGMENTIN) 500-125 MG tablet  3 times daily     09/25/17 1159   09/24/17 0000  oxyCODONE (OXY IR/ROXICODONE) 5 MG immediate release tablet  Every  3 hours PRN     09/24/17 0931     Follow-up Information    [name], MD Follow up.   Specialty:  Internal Medicine Contact information: [place] [address] [phone]          The results of significant diagnostics from this hospitalization (including imaging, microbiology, ancillary and laboratory) are listed below for reference.    Microbiology: Recent Results (from the past  240 hour(s))  Culture, Urine     Status: Abnormal   Collection Time: 09/16/17  2:23 PM  Result Value Ref Range Status   Specimen Description URINE, RANDOM  Final   Special Requests NONE  Final   Culture MULTIPLE SPECIES PRESENT, SUGGEST RECOLLECTION (A)  Final   Report Status 09/18/2017 FINAL  Final  Culture, blood (Routine X 2) w Reflex to ID Panel     Status: None   Collection Time: 09/16/17  4:06 PM  Result Value Ref Range Status   Specimen Description BLOOD RIGHT FOREARM  Final   Special Requests   Final    BOTTLES DRAWN AEROBIC AND ANAEROBIC Blood Culture adequate volume   Culture   Final    NO GROWTH 5 DAYS Performed at [facility], [address], [place], [place] [address]    Report Status 09/21/2017 FINAL  Final  Culture, blood (Routine X 2) w Reflex to ID Panel     Status: None   Collection Time: 09/16/17  4:18 PM  Result Value Ref Range Status   Specimen Description BLOOD LEFT ANTECUBITAL  Final   Special Requests   Final    BOTTLES DRAWN AEROBIC AND ANAEROBIC Blood Culture adequate volume   Culture   Final    NO GROWTH 5 DAYS Performed at [facility], [place] [address] [place], [place] [address]    Report Status 09/21/2017 FINAL  Final  MRSA PCR Screening     Status: None   Collection Time: 09/17/17  5:25 AM  Result Value Ref Range Status   MRSA by PCR NEGATIVE NEGATIVE Final    Comment:        The GeneXpert MRSA Assay (FDA approved for NASAL specimens only), is one component of a comprehensive MRSA colonization surveillance program. It is not intended to diagnose MRSA infection nor to guide or monitor treatment for MRSA infections.   Culture, expectorated sputum-assessment     Status: None   Collection Time: 09/17/17  9:07 AM  Result Value Ref Range Status   Specimen Description  SPUTUM  Final   Special Requests NONE  Final   Sputum evaluation THIS SPECIMEN IS ACCEPTABLE FOR SPUTUM CULTURE  Final   Report Status 09/17/2017 FINAL  Final   Culture, respiratory (NON-Expectorated)     Status: None   Collection Time: 09/17/17  9:07 AM  Result Value Ref Range Status   Specimen Description SPUTUM  Final   Special Requests NONE Reflexed from [id number]  Final   Gram Stain   Final    DEGENERATED CELLULAR MATERIAL PRESENT NO WBC IDENTIFIED NO ORGANISMS SEEN    Culture   Final    NO GROWTH 2 DAYS Performed at [facility], [address], [place], [place] [address]    Report Status 09/19/2017 FINAL  Final  Culture, expectorated sputum-assessment     Status: None   Collection Time: 09/20/17  2:00 AM  Result Value Ref Range Status   Specimen Description SPUTUM  Final   Special Requests NONE  Final   Sputum evaluation   Final    THIS SPECIMEN IS ACCEPTABLE FOR SPUTUM CULTURE Performed at [facility], [place] [address], [place], [place] [address]    Report Status 09/20/2017 FINAL  Final  Culture, respiratory (NON-Expectorated)     Status: None   Collection Time: 09/20/17  2:00 AM  Result Value Ref Range Status   Specimen Description SPUTUM  Final   Special Requests NONE Reflexed from [id number]  Final   Gram Stain   Final    FEW WBC PRESENT, PREDOMINANTLY PMN ABUNDANT YEAST WITH PSEUDOHYPHAE RARE GRAM POSITIVE COCCI RARE GRAM POSITIVE RODS Performed at [facility], [place] [address], [place], [place] [address]    Culture MODERATE CANDIDA TROPICALIS  Final   Report Status 09/23/2017 FINAL  Final     Labs: Basic Metabolic Panel:  Recent Labs Lab 09/19/17 0549 09/20/17 0609 09/21/17 0600 09/22/17 0644 09/23/17 0634 09/24/17 0440 09/25/17 0720  NA 132* 135 137 132* 134* 135 138  K 4.0 3.3* 3.2* 4.4 3.5 3.1* 3.5  CL 104 101 100* 100* 98* 99* 104  CO2 16* 24 23 24 25 25 24   GLUCOSE 83 108* 143* 148* 146* 117* 128*  BUN 21* 21* 26* 28* 24* 22* 21*  CREATININE 0.67 0.56 0.86 0.51 0.45 0.47 0.43*  CALCIUM 8.0* 8.1* 8.0* 8.2* 8.0* 8.2* 7.9*  MG 2.2 1.7 1.5* 2.2  --   --   --    Liver Function  Tests:  Recent Labs Lab 09/22/17 0644  AST 45*  ALT 28  ALKPHOS 52  BILITOT 0.7  PROT 5.9*  ALBUMIN 2.3*   CBC:  Recent Labs Lab 09/20/17 0609 09/21/17 0600 09/22/17 0644 09/23/17 0634 09/24/17 0440 09/25/17 0720  WBC 15.3* 13.3* 11.7* 18.3* 19.3* 13.2*  NEUTROABS 12.9* 12.3* 10.5*  --   --   --   HGB 12.0 10.9* 11.0* 12.8 12.9 12.7  HCT 35.0* 32.0* 32.8* 37.4 38.2 38.2  MCV 92.1 92.2 91.6 91.4 90.1 91.2  PLT 322 325 355 438* 458* 417*   BNP (last 3 results)  Recent Labs  09/16/17 1442  BNP 54.6   SIGNED: Time coordinating discharge: 60 minutes  Faye Ramsay, MD  [facility] 09/25/2017, 11:59 AM Pager [phone]  If 7PM-7AM, please contact night-coverage [email or web address] Password [email or web address]

## 2017-09-25 NOTE — Progress Notes (Signed)
Pt accepted in transfer to [facility]. Pt to be admitted under hospitalist service. They will call when bed available,  Faye Ramsay, MD  [facility] Pager [phone]  If 7PM-7AM, please contact night-coverage [email or web address] Password [email or web address]

## 2017-09-25 NOTE — Progress Notes (Signed)
Pt picked up for transport by EMS to [facility]. Pt in no apparent distress during transport.

## 2017-09-25 NOTE — Discharge Instructions (Signed)
Aspiration Pneumonia °Aspiration pneumonia is an infection in your lungs. It occurs when food, liquid, or stomach contents (vomit) are inhaled (aspirated) into your lungs. When these things get into your lungs, swelling (inflammation) and infection can occur. This can make it difficult for you to breathe. Aspiration pneumonia is a serious condition and can be life threatening. °What increases the risk? °Aspiration pneumonia is more likely to occur when a person's cough (gag) reflex or ability to swallow has been decreased. Some things that can do this include: °· Having a brain injury or disease, such as stroke, seizures, Parkinson's disease, dementia, or amyotrophic lateral sclerosis (ALS). °· Being given general anesthetic for procedures. °· Being in a coma (unconscious). °· Having a narrowing of the tube that carries food to the stomach (esophagus). °· Drinking too much alcohol. If a person passes out and vomits, vomit can be swallowed into the lungs. °· Taking certain medicines, such as tranquilizers or sedatives. ° °What are the signs or symptoms? °· Coughing after swallowing food or liquids. °· Breathing problems, such as wheezing or shortness of breath. °· Bluish skin. This can be caused by lack of oxygen. °· Coughing up food or mucus. The mucus might contain blood, greenish material, or yellowish-white fluid (pus). °· Fever. °· Chest pain. °· Being more tired than usual (fatigue). °· Sweating more than usual. °· Bad breath. °How is this diagnosed? °A physical exam will be done. During the exam, the health care provider will listen to your lungs with a stethoscope to check for: °· Crackling sounds in the lungs. °· Decreased breath sounds. °· A rapid heartbeat. ° °Various tests may be ordered. These may include: °· Chest X-ray. °· CT scan. °· Swallowing study. This test looks at how food is swallowed and whether it goes into your breathing tube (trachea) or food pipe (esophagus). °· Sputum culture. Saliva and  mucus (sputum) are collected from the lungs or the tubes that carry air to the lungs (bronchi). The sputum is then tested for bacteria. °· Bronchoscopy. This test uses a flexible tube (bronchoscope) to see inside the lungs. ° °How is this treated? °Treatment will usually include antibiotic medicines. Other medicines may also be used to reduce fever or pain. You may need to be treated in the hospital. In the hospital, your breathing will be carefully monitored. Depending on how well you are breathing, you may need to be given oxygen, or you may need breathing support from a breathing machine (ventilator). For people who fail a swallowing study, a feeding tube might be placed in the stomach, or they may be asked to avoid certain food textures or liquids when they eat. °Follow these instructions at home: °· Carefully follow any special eating instructions you were given, such as avoiding certain food textures or thickening liquids. This reduces the risk of developing aspiration pneumonia again. °· Only take over-the-counter or prescription medicines as directed by your health care provider. Follow the directions carefully. °· If you were prescribed antibiotics, take them as directed. Finish them even if you start to feel better. °· Rest as instructed by your health care provider. °· Keep all follow-up appointments with your health care provider. °Contact a health care provider if: °· You develop worsening shortness of breath, wheezing, or difficulty breathing. °· You develop a fever. °· You have chest pain. °This information is not intended to replace advice given to you by your health care provider. Make sure you discuss any questions you have with your health care   provider. °Document Released: 10/13/2009 Document Revised: 05/29/2016 Document Reviewed: 06/03/2013 °[employer] Education © 2017 [employer] ° °

## 2017-10-06 ENCOUNTER — Ambulatory Visit: Payer: Medicare Other | Admitting: Internal Medicine

## 2017-10-21 ENCOUNTER — Institutional Professional Consult (permissible substitution): Payer: Medicare Other | Admitting: Pulmonary Disease

## 2017-10-30 DEATH — deceased

## 2017-11-07 ENCOUNTER — Ambulatory Visit: Payer: Medicare Other | Admitting: Internal Medicine
